# Patient Record
Sex: Male | Born: 1965
Health system: Southern US, Community
[De-identification: ages and names within clinical notes are randomized; demographics above are authoritative.]

## PROBLEM LIST (undated history)

## (undated) DIAGNOSIS — Z8249 Family history of ischemic heart disease and other diseases of the circulatory system: Secondary | ICD-10-CM

## (undated) DIAGNOSIS — K219 Gastro-esophageal reflux disease without esophagitis: Secondary | ICD-10-CM

## (undated) DIAGNOSIS — I8390 Asymptomatic varicose veins of unspecified lower extremity: Secondary | ICD-10-CM

## (undated) DIAGNOSIS — N529 Male erectile dysfunction, unspecified: Secondary | ICD-10-CM

## (undated) DIAGNOSIS — I10 Essential (primary) hypertension: Secondary | ICD-10-CM

## (undated) DIAGNOSIS — R931 Abnormal findings on diagnostic imaging of heart and coronary circulation: Secondary | ICD-10-CM

## (undated) DIAGNOSIS — R600 Localized edema: Secondary | ICD-10-CM

## (undated) DIAGNOSIS — E785 Hyperlipidemia, unspecified: Secondary | ICD-10-CM

## (undated) HISTORY — DX: Family history of ischemic heart disease and other diseases of the circulatory system: Z82.49

## (undated) HISTORY — PX: ENDOVENOUS ABLATION SAPHENOUS VEIN W/ LASER: SUR449

## (undated) HISTORY — DX: Essential (primary) hypertension: I10

## (undated) HISTORY — DX: Gastro-esophageal reflux disease without esophagitis: K21.9

## (undated) HISTORY — DX: Localized edema: R60.0

## (undated) HISTORY — DX: Abnormal findings on diagnostic imaging of heart and coronary circulation: R93.1

## (undated) HISTORY — PX: TONSILLECTOMY: SUR1361

## (undated) HISTORY — DX: Hyperlipidemia, unspecified: E78.5

## (undated) HISTORY — DX: Male erectile dysfunction, unspecified: N52.9

## (undated) HISTORY — DX: Asymptomatic varicose veins of unspecified lower extremity: I83.90

---

## 2008-11-12 ENCOUNTER — Ambulatory Visit: Payer: Self-pay | Admitting: Family Medicine

## 2008-11-12 DIAGNOSIS — I10 Essential (primary) hypertension: Secondary | ICD-10-CM | POA: Insufficient documentation

## 2008-11-12 DIAGNOSIS — R609 Edema, unspecified: Secondary | ICD-10-CM | POA: Insufficient documentation

## 2008-11-16 LAB — CONVERTED CEMR LAB
Alkaline Phosphatase: 121 units/L — ABNORMAL HIGH (ref 39–117)
Bilirubin, Direct: 0.1 mg/dL (ref 0.0–0.3)
Chloride: 104 meq/L (ref 96–112)
Eosinophils Absolute: 0.1 10*3/uL (ref 0.0–0.7)
GFR calc Af Amer: 94 mL/min
GFR calc non Af Amer: 77 mL/min
Glucose, Bld: 95 mg/dL (ref 70–99)
HCT: 48.1 % (ref 39.0–52.0)
Monocytes Absolute: 0.8 10*3/uL (ref 0.1–1.0)
Monocytes Relative: 6.6 % (ref 3.0–12.0)
Neutrophils Relative %: 73.5 % (ref 43.0–77.0)
Platelets: 326 10*3/uL (ref 150–400)
Potassium: 4.6 meq/L (ref 3.5–5.1)
RDW: 13.3 % (ref 11.5–14.6)
Sodium: 138 meq/L (ref 135–145)

## 2008-11-30 ENCOUNTER — Encounter: Payer: Self-pay | Admitting: Family Medicine

## 2008-11-30 ENCOUNTER — Ambulatory Visit: Payer: Self-pay

## 2009-04-21 ENCOUNTER — Ambulatory Visit: Payer: Self-pay | Admitting: Family Medicine

## 2009-04-27 LAB — CONVERTED CEMR LAB
Calcium: 9.2 mg/dL (ref 8.4–10.5)
Direct LDL: 127.6 mg/dL
GFR calc non Af Amer: 85.93 mL/min (ref >60)
HDL: 25.4 mg/dL — ABNORMAL LOW (ref >39.00)
Sodium: 141 meq/L (ref 135–145)
Total CHOL/HDL Ratio: 8
Triglycerides: 296 mg/dL — ABNORMAL HIGH (ref 0.0–149.0)

## 2009-10-08 ENCOUNTER — Ambulatory Visit: Payer: Self-pay | Admitting: Family Medicine

## 2009-10-08 DIAGNOSIS — E785 Hyperlipidemia, unspecified: Secondary | ICD-10-CM | POA: Insufficient documentation

## 2009-10-08 DIAGNOSIS — I8390 Asymptomatic varicose veins of unspecified lower extremity: Secondary | ICD-10-CM | POA: Insufficient documentation

## 2009-11-03 ENCOUNTER — Encounter: Payer: Self-pay | Admitting: Family Medicine

## 2009-11-03 ENCOUNTER — Ambulatory Visit: Payer: Self-pay | Admitting: Vascular Surgery

## 2010-02-04 ENCOUNTER — Ambulatory Visit: Payer: Self-pay | Admitting: Vascular Surgery

## 2010-02-04 ENCOUNTER — Encounter: Payer: Self-pay | Admitting: Family Medicine

## 2010-03-02 ENCOUNTER — Ambulatory Visit: Payer: Self-pay | Admitting: Vascular Surgery

## 2010-03-02 ENCOUNTER — Encounter: Payer: Self-pay | Admitting: Family Medicine

## 2010-03-11 ENCOUNTER — Ambulatory Visit: Payer: Self-pay | Admitting: Vascular Surgery

## 2010-08-01 ENCOUNTER — Telehealth: Payer: Self-pay | Admitting: Family Medicine

## 2010-08-15 ENCOUNTER — Encounter: Payer: Self-pay | Admitting: Family Medicine

## 2010-08-15 ENCOUNTER — Ambulatory Visit: Payer: Self-pay | Admitting: Family Medicine

## 2010-08-17 ENCOUNTER — Telehealth: Payer: Self-pay | Admitting: Family Medicine

## 2010-08-17 LAB — CONVERTED CEMR LAB
Albumin: 4.2 g/dL (ref 3.5–5.2)
BUN: 15 mg/dL (ref 6–23)
Basophils Relative: 0.2 % (ref 0.0–3.0)
Bilirubin, Direct: 0.1 mg/dL (ref 0.0–0.3)
CO2: 25 meq/L (ref 19–32)
Chlamydia, Swab/Urine, PCR: NEGATIVE
Chloride: 105 meq/L (ref 96–112)
Cholesterol: 141 mg/dL (ref 0–200)
Creatinine, Ser: 1 mg/dL (ref 0.4–1.5)
Eosinophils Absolute: 0.1 10*3/uL (ref 0.0–0.7)
GC Probe Amp, Urine: NEGATIVE
MCHC: 34.7 g/dL (ref 30.0–36.0)
MCV: 90.2 fL (ref 78.0–100.0)
Monocytes Absolute: 0.8 10*3/uL (ref 0.1–1.0)
Neutrophils Relative %: 63.1 % (ref 43.0–77.0)
Platelets: 304 10*3/uL (ref 150.0–400.0)
RBC: 4.92 M/uL (ref 4.22–5.81)
TSH: 2.04 microintl units/mL (ref 0.35–5.50)
Total Protein: 7 g/dL (ref 6.0–8.3)
Triglycerides: 161 mg/dL — ABNORMAL HIGH (ref 0.0–149.0)

## 2010-08-24 ENCOUNTER — Telehealth: Payer: Self-pay | Admitting: Family Medicine

## 2010-09-02 ENCOUNTER — Telehealth: Payer: Self-pay | Admitting: Family Medicine

## 2010-10-04 NOTE — Assessment & Plan Note (Signed)
Summary: MED CHECK/BLOOD PRESSURE CHECK/CJR   Vital Signs:  Patient profile:   45 year old male Weight:      247 pounds BMI:     33.62 Temp:     98.8 degrees F oral Pulse rate:   81 / minute BP sitting:   144 / 92  (left arm) Cuff size:   large  Vitals Entered By: Alfred Levins, CMA (October 08, 2009 10:19 AM) CC: renew meds, referral for rt leg pain   History of Present Illness: Here to follow up on lipids and BP, and he would like a referral to see someone about his varicose veins in the legs. He asks to see Dr. Arbie Cookey about this. He took 3 months of Simvastatin last fall and tolerated it well, but unfortunately he never came back to check his lab work. he has been off it now for several months. He says he has changed his diet and eliminated fried foods. He feels fine in general. His BP at home is quite steady in the 120s / 70s.   Current Medications (verified): 1)  Lisinopril 10 Mg Tabs (Lisinopril) .... Once Daily 2)  Levitra 20 Mg Tabs (Vardenafil Hcl) .... As Needed 3)  Furosemide 40 Mg Tabs (Furosemide) .... Once Daily 4)  Simvastatin 40 Mg Tabs (Simvastatin) .... Take One Tablet At Bedtime  Allergies (verified): No Known Drug Allergies  Past History:  Past Medical History: Hypertension ED leg edema venous doppler of right leg on 11-30-08 was negative for DVT Hyperlipidemia  Review of Systems  The patient denies anorexia, fever, weight loss, weight gain, vision loss, decreased hearing, hoarseness, chest pain, syncope, dyspnea on exertion, peripheral edema, prolonged cough, headaches, hemoptysis, abdominal pain, melena, hematochezia, severe indigestion/heartburn, hematuria, incontinence, genital sores, muscle weakness, suspicious skin lesions, transient blindness, difficulty walking, depression, unusual weight change, abnormal bleeding, enlarged lymph nodes, angioedema, breast masses, and testicular masses.    Physical Exam  General:  Well-developed,well-nourished,in  no acute distress; alert,appropriate and cooperative throughout examination Lungs:  Normal respiratory effort, chest expands symmetrically. Lungs are clear to auscultation, no crackles or wheezes. Heart:  Normal rate and regular rhythm. S1 and S2 normal without gallop, murmur, click, rub or other extra sounds.   Impression & Recommendations:  Problem # 1:  HYPERLIPIDEMIA (ICD-272.4)  His updated medication list for this problem includes:    Simvastatin 40 Mg Tabs (Simvastatin) .Marland Kitchen... Take one tablet at bedtime  Problem # 2:  VARICOSE VEINS, LOWER EXTREMITIES (ICD-454.9)  Orders: [facility] (Vascular)  Problem # 3:  HYPERTENSION (ICD-401.9)  His updated medication list for this problem includes:    Lisinopril 10 Mg Tabs (Lisinopril) ..... Once daily    Furosemide 40 Mg Tabs (Furosemide) ..... Once daily  Complete Medication List: 1)  Lisinopril 10 Mg Tabs (Lisinopril) .... Once daily 2)  Levitra 20 Mg Tabs (Vardenafil hcl) .... As needed 3)  Furosemide 40 Mg Tabs (Furosemide) .... Once daily 4)  Simvastatin 40 Mg Tabs (Simvastatin) .... Take one tablet at bedtime  Patient Instructions: 1)  It is important that you exercise reguarly at least 20 minutes 5 times a week. If you develop chest pain, have severe difficulty breathing, or feel very tired, stop exercising immediately and seek medical attention.  2)  You need to lose weight. Consider a lower calorie diet and regular exercise.  3)  Restart on Simvastatin, and do follow up labs in 90 days.  4)  Refer to Dr. Arbie [facility]key [facility]  Prescriptions: SIMVASTATIN 40 MG TABS (SIMVASTATIN) Take one tablet at bedtime  #90 x 3   Entered and Authorized by:   Nelwyn Salisbury MD   Signed by:   Nelwyn Salisbury MD on 10/08/2009   Method used:   Print then Give to Patient   RxID:   [id number] FUROSEMIDE 40 MG TABS (FUROSEMIDE) once daily  #90 x 3   Entered and Authorized by:   Nelwyn Salisbury MD   Signed by:   Nelwyn Salisbury MD on 10/08/2009   Method used:   Print then Give to Patient   RxID:   [id number] LEVITRA 20 MG TABS (VARDENAFIL HCL) as needed  #10 x 11   Entered and Authorized by:   Nelwyn Salisbury MD   Signed by:   Nelwyn Salisbury MD on 10/08/2009   Method used:   Print then Give to Patient   RxID:   [id number] LISINOPRIL 10 MG TABS (LISINOPRIL) once daily  #90 x 3   Entered and Authorized by:   Nelwyn Salisbury MD   Signed by:   Nelwyn Salisbury MD on 10/08/2009   Method used:   Print then Give to Patient   RxID:   [id number]

## 2010-10-04 NOTE — Letter (Signed)
Summary: [facility]  [facility] [place]   Imported By: Maryln Gottron 03/18/2010 14:54:17  _____________________________________________________________________  External Attachment:    Type:   Image     Comment:   External Document

## 2010-10-04 NOTE — Letter (Signed)
Summary: [facility]  [facility] [place]   Imported By: Maryln Gottron 11/25/2009 13:11:10  _____________________________________________________________________  External Attachment:    Type:   Image     Comment:   External Document

## 2010-10-04 NOTE — Progress Notes (Signed)
Summary: wants labs ordered  Phone Note Call from Patient Call back at [phone]   Caller: Patient---live call Summary of Call: wants order to check cholesterol. wants lab to check for std and hiv, because he has a new girlfriend. Initial call taken by: Warnell Forester,  August 01, 2010 10:31 AM  Follow-up for Phone Call        needs an OV for this. Come in fasting  Follow-up by: Nelwyn Salisbury MD,  August 01, 2010 1:10 PM  Additional Follow-up for Phone Call Additional follow up Details #1::        pt will see dr fry next week. Additional Follow-up by: Warnell Forester,  August 01, 2010 1:54 PM

## 2010-10-04 NOTE — Letter (Signed)
Summary: [facility]  [facility] [place]   Imported By: Maryln Gottron 02/21/2010 11:06:04  _____________________________________________________________________  External Attachment:    Type:   Image     Comment:   External Document

## 2010-10-06 NOTE — Assessment & Plan Note (Signed)
Summary: fup---will fast --ok per dr fry//ccm/pt rsc/cjr   Vital Signs:  Patient profile:   45 year old male Weight:      260 pounds BMI:     35.39 O2 Sat:      91 % Temp:     98.2 degrees F oral Pulse rate:   71 / minute Pulse rhythm:   regular Resp:     12 per minute BP sitting:   152 / 96  Vitals Entered By: Lynann Beaver CMA AAMA (August 15, 2010 8:51 AM) CC: rov and labs  Pain Assessment Patient in pain? no        History of Present Illness: Here for follow up. He feels fine but has gained some weight. He does not exercise. His BP at home is steady in the 130s over 80s.   Current Medications (verified): 1)  Lisinopril 10 Mg Tabs (Lisinopril) .... Once Daily 2)  Levitra 20 Mg Tabs (Vardenafil Hcl) .... As Needed 3)  Simvastatin 40 Mg Tabs (Simvastatin) .... Take One Tablet At Bedtime 4)  Ranitidine Hcl 150 Mg Tabs (Ranitidine Hcl) .Marland Kitchen.. 1-2 Daily  Allergies (verified): No Known Drug Allergies  Past History:  Past Medical History: Reviewed history from 10/08/2009 and no changes required. Hypertension ED leg edema venous doppler of right leg on 11-30-08 was negative for DVT Hyperlipidemia  Past Surgical History: Tonsillectomy ablation of right lower leg veins June 2011 per Dr. Tawanna Cooler Early  Review of Systems  The patient denies anorexia, fever, weight loss, vision loss, decreased hearing, hoarseness, chest pain, syncope, dyspnea on exertion, peripheral edema, prolonged cough, headaches, hemoptysis, abdominal pain, melena, hematochezia, severe indigestion/heartburn, hematuria, incontinence, genital sores, muscle weakness, suspicious skin lesions, transient blindness, difficulty walking, depression, unusual weight change, abnormal bleeding, enlarged lymph nodes, angioedema, breast masses, and testicular masses.    Physical Exam  General:  overweight-appearing.   Neck:  No deformities, masses, or tenderness noted. Lungs:  Normal respiratory effort, chest expands  symmetrically. Lungs are clear to auscultation, no crackles or wheezes. Heart:  Normal rate and regular rhythm. S1 and S2 normal without gallop, murmur, click, rub or other extra sounds. Extremities:  no edema    Impression & Recommendations:  Problem # 1:  HYPERLIPIDEMIA (ICD-272.4)  His updated medication list for this problem includes:    Simvastatin 40 Mg Tabs (Simvastatin) .Marland Kitchen... Take one tablet at bedtime  Problem # 2:  HYPERTENSION (ICD-401.9)  The following medications were removed from the medication list:    Furosemide 40 Mg Tabs (Furosemide) ..... Once daily His updated medication list for this problem includes:    Lisinopril 10 Mg Tabs (Lisinopril) ..... Once daily  Orders: UA Dipstick w/o Micro (automated)  (81003) Venipuncture (16109) TLB-Lipid Panel (80061-LIPID) TLB-BMP (Basic Metabolic Panel-BMET) (80048-METABOL) TLB-CBC Platelet - w/Differential (85025-CBCD) TLB-Hepatic/Liver Function Pnl (80076-HEPATIC) TLB-TSH (Thyroid Stimulating Hormone) (84443-TSH)  Complete Medication List: 1)  Lisinopril 10 Mg Tabs (Lisinopril) .... Once daily 2)  Levitra 20 Mg Tabs (Vardenafil hcl) .... As needed 3)  Simvastatin 40 Mg Tabs (Simvastatin) .... Take one tablet at bedtime 4)  Ranitidine Hcl 150 Mg Tabs (Ranitidine hcl) .Marland Kitchen.. 1-2 daily  Other Orders: T-Chlamydia  Probe, urine [id number]) T-GC Probe, urine [phone]) T-HIV Antibody  (Reflex) [phone]) T-RPR (Syphilis) (96295-28413) Prescriptions: SIMVASTATIN 40 MG TABS (SIMVASTATIN) Take one tablet at bedtime  #90 x 3   Entered and Authorized by:   Nelwyn Salisbury MD   Signed by:   Nelwyn Salisbury MD on 08/15/2010   Method  used:   Print then Give to Patient   RxID:   [id number] LEVITRA 20 MG TABS (VARDENAFIL HCL) as needed  #30 x 3   Entered and Authorized by:   Nelwyn Salisbury MD   Signed by:   Nelwyn Salisbury MD on 08/15/2010   Method used:   Print then Give to Patient   RxID:   [id number] LISINOPRIL  10 MG TABS (LISINOPRIL) once daily  #90 x 3   Entered and Authorized by:   Nelwyn Salisbury MD   Signed by:   Nelwyn Salisbury MD on 08/15/2010   Method used:   Print then Give to Patient   RxID:   [id number]    Orders Added: 1)  Est. Patient Level IV [01027] 2)  UA Dipstick w/o Micro (automated)  [81003] 3)  Venipuncture [25366] 4)  TLB-Lipid Panel [80061-LIPID] 5)  TLB-BMP (Basic Metabolic Panel-BMET) [80048-METABOL] 6)  TLB-CBC Platelet - w/Differential [85025-CBCD] 7)  TLB-Hepatic/Liver Function Pnl [80076-HEPATIC] 8)  TLB-TSH (Thyroid Stimulating Hormone) [84443-TSH] 9)  T-Chlamydia  Probe, urine [[id number]] 10)  T-GC Probe, urine [phone] 11)  T-HIV Antibody  (Reflex) [[id number]] 12)  T-RPR (Syphilis) [06301-60109]  Appended Document: Orders Update    Clinical Lists Changes  Orders: Added new Service order of Specimen Handling (32355) - Signed      Appended Document: fup---will fast --ok per dr fry//ccm/pt rsc/cjr  Laboratory Results   Urine Tests    Routine Urinalysis   Color: yellow Appearance: Clear Glucose: negative   (Normal Range: Negative) Bilirubin: negative   (Normal Range: Negative) Ketone: negative   (Normal Range: Negative) Spec. Gravity: 1.025   (Normal Range: 1.003-1.035) Blood: negative   (Normal Range: Negative) pH: 6.0   (Normal Range: 5.0-8.0) Protein: negative   (Normal Range: Negative) Urobilinogen: 0.2   (Normal Range: 0-1) Nitrite: negative   (Normal Range: Negative) Leukocyte Esterace: negative   (Normal Range: Negative)    Comments: Rita Ohara  August 15, 2010 11:52 AM

## 2010-10-06 NOTE — Progress Notes (Signed)
Summary: lab results  Phone Note Call from Patient Call back at [phone]   Caller: Patient Call For: Nelwyn Salisbury MD Summary of Call: Pt would like lab results Initial call taken by: Heron Sabins,  August 17, 2010 8:45 AM  Follow-up for Phone Call        left message for pt to callback office Follow-up by: Brenton Grills CMA Duncan Dull),  August 17, 2010 4:34 PM  Additional Follow-up for Phone Call Additional follow up Details #1::        see the reports Additional Follow-up by: Nelwyn Salisbury MD,  August 17, 2010 4:46 PM    Additional Follow-up for Phone Call Additional follow up Details #2::    pt informed. copy of labs and report left up front in cabinet for pick up per pt's request Follow-up by: Brenton Grills CMA Duncan Dull),  August 17, 2010 4:49 PM

## 2010-10-06 NOTE — Progress Notes (Signed)
Summary: needs CMN for levitra  Phone Note Call from Patient Call back at Home Phone [phone]   Caller: Spouse----live call Summary of Call: wife picked up rxs today and she mentioned that there was supposed to have been a CMN filed out regarding his Levitra rx. please advise and call pt when done. Initial call taken by: Warnell Forester,  August 24, 2010 4:32 PM  Follow-up for Phone Call        I have not received such a fax yet. Will do this when it comes  Follow-up by: Nelwyn Salisbury MD,  August 24, 2010 5:27 PM  Additional Follow-up for Phone Call Additional follow up Details #1::        wife stated that the patient discussed this with dr fry during his visit. Additional Follow-up by: Warnell Forester,  August 25, 2010 8:02 AM    Additional Follow-up for Phone Call Additional follow up Details #2::    we still have not received anything about this. As I told him at the visit, we cannot generate a prior authorization. This needs to come from his pharmacist. he should ask the pharmacist to start the process, and I will fill out the form when it comes  Follow-up by: Nelwyn Salisbury MD,  August 30, 2010 1:16 PM  Additional Follow-up for Phone Call Additional follow up Details #3:: Details for Additional Follow-up Action Taken: left mess with pt to return call  left mess to return call --wed jan 4 at 2pm Additional Follow-up by: Pura Spice, RN,  September 06, 2010 5:16 PM

## 2010-10-06 NOTE — Progress Notes (Signed)
Summary: refill simvastatin   Phone Note Call from Patient   Caller: Spouse Call For: Nelwyn Salisbury MD Summary of Call: Wants to know if he can decrease his simvastatin since his chol is so low. [phone] Initial call taken by: Lynann Beaver CMA AAMA,  September 02, 2010 3:47 PM  Follow-up for Phone Call        No , I would rather he stay on the current dose  Follow-up by: Nelwyn Salisbury MD,  September 07, 2010 1:31 PM  Additional Follow-up for Phone Call Additional follow up Details #1::        left mess to return call  Additional Follow-up by: Pura Spice, RN,  September 07, 2010 1:58 PM     Appended Document: refill simvastatin  pt awaare

## 2010-11-11 ENCOUNTER — Other Ambulatory Visit: Payer: Self-pay | Admitting: *Deleted

## 2010-11-11 MED ORDER — LISINOPRIL 10 MG PO TABS: 10.0000 mg | ORAL_TABLET | Freq: Every day | ORAL | Status: DC

## 2011-01-17 NOTE — Assessment & Plan Note (Signed)
OFFICE VISIT   CAZ, WEAVER  DOB:  1965-11-02                                       02/04/2010  XBJYN#:[id number]   This patient presents today for continued discussion regarding his  severe venous hypertension and venous ulcer disease.  I had seen him  approximately 3-4 months ago.  He has extensive changes of venous  hypertension with hemosiderin deposit and erythema on his right medial  ankle.  He attempted to wear graduated compression stockings but these  made his discomfort much worse and is unable to tolerate them.  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  He also reports yard work and work around the  home is also difficult with any prolonged standing.  He does wear an  anklet wrap and this is not improving his venous hypertension.   I have again discussed options with the patient with his wife present.  I explained that he does have superficial and deep venous insufficiency.  I explained that the deep venous insufficiency would not be able to be  treated surgically.  I did explain that we would be able to reduce his  level of venous hypertension with ablation of his right great and small  saphenous vein since he has reflux in both of these.  This should make a  significant impact on his venous hypertension.  He understands and  wishes to proceed with surgery and we will schedule this at his earliest  convenience.     Larina Earthly, M.D.  Electronically Signed   TFE/MEDQ  D:  02/04/2010  T:  02/07/2010  Job:  [id number]   cc:   Jeannett Senior A. Clent Ridges, MD

## 2011-01-17 NOTE — Consult Note (Signed)
NEW PATIENT CONSULTATION   TARRANCE, JANUSZEWSKI  DOB:  11-18-1965                                       11/03/2009  SAYTK#:[id number]   The patient presents today for evaluation of bilateral venous  hypertension, right greater than left.  He is a very pleasant, active 45-  year-old gentleman with progressive changes of venous hypertension in  both legs, much more so on the right than on the left.  He does not have  any history of DVT, superficial thrombophlebitis or bleeding.  He does  have chronic swelling in his right leg more so than the left.  He did  have an old injury on the medial aspect of his right ankle with a  football injury with an open fracture in high school.  He does have  above this area a large area of hemosiderin deposit and changes of  chronic venous hypertension.  He is here today for further discussion of  this.  He does have a long history of varicose veins in both lower  extremities, more so on the right.  He does report discomfort with  prolonged standing.  His past history is significant only for  hypertension for which he is treated.  No other major medical  difficulties.   FAMILY HISTORY:  Significant for premature atherosclerotic disease with  a heart attack in his father at age 67.  He also has severe venous  varicose veins in a maternal grandfather.   SOCIAL HISTORY:  He is single with four children.  He is an Corporate investment banker.  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  He does not smoke or drink alcohol.   REVIEW OF SYSTEMS:  Otherwise unremarkable and is as documented in his  office chart.   PHYSICAL EXAM:  General:  A well-developed, well-nourished white male  appearing stated age of 79.  Vital signs:  Blood pressure is 142/95,  pulse 71, respirations 16.  In general he well-nourished and in no acute  distress.  HEENT:  Normal.  Musculoskeletal:  System shows no major  deformities or cyanosis.  Neurologic:  Without focal weakness,  paresthesias.   Skin:  He does have no open ulcer but hemosiderin  deposits and inflammation of the medial aspect of his right ankle.  He  has no similar changes on the left side.   He underwent noninvasive vascular laboratory studies in our office and I  reviewed this with the patient.  This does show gross reflux in his  great and small saphenous veins bilaterally.  I imaged these myself and  he does have an extremely dilated right small saphenous vein.   I discussed options with the patient.  He has not worn compression  garments and I have instructed him on the importance of their use,  elevation and also Advil 600 mg t.i.d. for discomfort.  I did feel that  he would be an excellent candidate for saphenous vein ablation and  discussed this option with him as well.  We have fitted him with thigh  high graduated compression garments today and plan to see him again in 3  months for continued discussion.     Larina Earthly, M.D.  Electronically Signed   TFE/MEDQ  D:  11/03/2009  T:  11/04/2009  Job:  [id number]   cc:   Jeannett Senior A. Clent Ridges, MD

## 2011-01-17 NOTE — Procedures (Signed)
LOWER EXTREMITY VENOUS REFLUX EXAM   INDICATION:  Right lower extremity intermittent swelling with varicose  veins.   EXAM:  Using color-flow imaging and pulse Doppler spectral analysis, the  right and left common femoral, superficial femoral, popliteal, posterior  tibial, greater and lesser saphenous veins are evaluated.  There is  evidence suggesting deep venous insufficiency in the right and left  lower extremities.   The right and left saphenofemoral junctions are not competent with  Reflux of >553milliseconds. The right and left GSV's are not competent  with Reflux of >516milliseconds with the caliber as described below.)   The right and left proximal short saphenous veins demonstrate  incompetency, the right measuring 0.41 cm to 1.61 cm, the left from 0.34  cm to 0.74 cm.   GSV Diameter (used if found to be incompetent only)                                            Right    Left  Proximal Greater Saphenous Vein           0.38 cm  0.45 cm  Proximal-to-mid-thigh                     cm       cm  Mid thigh                                 0.56 cm  cm  Mid-distal thigh                          cm       cm  Distal thigh                              0.62 cm  0.88 cm  Knee                                      0.52 cm  0.73 cm   IMPRESSION:  1. Right greater saphenous vein Reflux with >591milliseconds is      identified with the caliber ranging from 0.38 cm to 0.62 cm knee to      groin.  The left from 0.45 cm to 0.88 cm.  2. The right and left greater saphenous veins are not aneurysmal.  3. The right and left greater saphenous veins are not tortuous.  4. The deep venous system is not competent with Reflux of      >526milliseconds.)  5. The right and left lesser saphenous veins are not competent with      Reflux of >51milliseconds.)         ___________________________________________  Larina Earthly, M.D.   AS/MEDQ  D:  11/03/2009  T:  11/03/2009  Job:   [id number]

## 2011-01-17 NOTE — Assessment & Plan Note (Signed)
OFFICE VISIT   LAZARUS, SUDBURY  DOB:  [birth year]                                       03/02/2010  ZOXWR#:[id number]   The patient presents today for treatment of his right leg venous  hypertension.  He underwent laser ablation of his right great saphenous  vein from his mid calf to the saphenofemoral junction and a small  saphenous vein from the distal calf to the saphenopopliteal junction.  He had no immediate complications and was discharged to home.  We will  see him in 1 week with repeat duplex.     Larina Earthly, M.D.  Electronically Signed   TFE/MEDQ  D:  03/02/2010  T:  03/03/2010  Job:  [id number]   cc:   Jeannett Senior A. Clent Ridges, MD

## 2011-01-17 NOTE — Assessment & Plan Note (Signed)
OFFICE VISIT   Lee Ballard, Lee Ballard  DOB:  08/09/1966                                       03/11/2010  ZOXWR#:[id number]   Patient presents today for 1-week followup of his laser ablation of his  right great and small saphenous vein for severe venous hypertension.  He  has the usual amount of soreness over the ablation sites themselves and  had very mild bruising.  He has been compliant with his graduated  compression garments.   Physical exam is unchanged.  Blood pressure 121/81, pulse 82,  respirations 18.  His dorsalis pedis pulses are 2+ bilaterally.  He does  have some thickening over the ablation site as well.   He underwent repeat venous duplex today which I have ordered and  independently reviewed and discussed with patient.  This shows ablation  of his great and small saphenous vein on the right with no evidence of  popliteal or femoral DVT.   I am pleased with the early result as is patient.  He will continue his  thigh-high compression for an additional week and then will drop back to  the knee-high compression due to his deep venous incompetence as well.  He does have saphenous vein incompetence and some early varicosity his  left leg.  We discussed this as well.  I explained that we are  conservative with recommendations regarding this and would recommend  treatment only should he develop progressive symptoms.     Larina Earthly, M.D.  Electronically Signed   TFE/MEDQ  D:  03/11/2010  T:  03/14/2010  Job:  [id number]

## 2011-01-20 NOTE — Procedures (Signed)
DUPLEX DEEP VENOUS EXAM - LOWER EXTREMITY   INDICATION:  Followup right greater saphenous vein ablation.   HISTORY:  Edema:  No  Trauma/Surgery:  Right greater saphenous vein ablation  Pain:  Yes  PE:  No  Previous DVT:  No  Anticoagulants:  No  Other:  No   DUPLEX EXAM:                CFV   SFV   PopV  PTV    GSV                R  L  R  L  R  L  R   L  R  L  Thrombosis    0  0  0     0     0      +  Spontaneous   +  +  +     +     +      0  Phasic        +  +  +     +     +      0  Augmentation  +  +  +     +     +      0  Compressible  +  +  +     +     +      0  Competent     +  0  0     0     +      0   Legend:  + - yes  o - no  p - partial  D - decreased   IMPRESSION:  There does not appear to be any evidence of deep vein  thrombus in the right leg.  The right greater saphenous vein appears  ablated proximal to the level of the knee.  The lesser saphenous vein  appears ablated at the junction to mid-calf.  Reflux noted in the right  femoral vein and popliteal vein.    _____________________________  Larina Earthly, M.D.   CB/MEDQ  D:  03/14/2010  T:  03/14/2010  Job:  [id number]

## 2011-01-24 ENCOUNTER — Other Ambulatory Visit: Payer: Self-pay | Admitting: *Deleted

## 2011-01-24 MED ORDER — VARDENAFIL HCL 20 MG PO TABS: 20.0000 mg | ORAL_TABLET | ORAL | Status: DC | PRN

## 2011-02-01 ENCOUNTER — Ambulatory Visit (INDEPENDENT_AMBULATORY_CARE_PROVIDER_SITE_OTHER): Payer: Managed Care, Other (non HMO) | Admitting: Family Medicine

## 2011-02-01 ENCOUNTER — Encounter: Payer: Self-pay | Admitting: Family Medicine

## 2011-02-01 VITALS — BP 132/88 | HR 84 | Temp 98.7°F | Resp 16 | Wt 273.0 lb

## 2011-02-01 DIAGNOSIS — J40 Bronchitis, not specified as acute or chronic: Secondary | ICD-10-CM

## 2011-02-01 MED ORDER — AZITHROMYCIN 250 MG PO TABS: ORAL_TABLET | ORAL | Status: AC

## 2011-02-01 NOTE — Progress Notes (Signed)
  Subjective:    Patient ID: Lee Ballard, male    DOB: June 13, 1966, 45 y.o.   MRN: [id number]  HPI Here for 3 days of PND, chest congestion, and coughing up green sputum. No fever. On Mucinex.   Review of Systems  Constitutional: Negative.   HENT: Positive for postnasal drip. Negative for congestion and sinus pressure.   Eyes: Negative.   Respiratory: Positive for cough.        Objective:   Physical Exam  Constitutional: He appears well-developed and well-nourished.  HENT:  Right Ear: External ear normal.  Left Ear: External ear normal.  Nose: Nose normal.  Mouth/Throat: Oropharynx is clear and moist. No oropharyngeal exudate.  Eyes: Conjunctivae are normal. Pupils are equal, round, and reactive to light.  Pulmonary/Chest: Effort normal and breath sounds normal. No respiratory distress. He has no wheezes. He has no rales. He exhibits no tenderness.  Lymphadenopathy:    He has no cervical adenopathy.          Assessment & Plan:  Rest, fluids

## 2011-06-07 ENCOUNTER — Telehealth: Payer: Self-pay | Admitting: Family Medicine

## 2011-06-07 MED ORDER — LISINOPRIL 10 MG PO TABS: 10.0000 mg | ORAL_TABLET | Freq: Every day | ORAL | Status: DC

## 2011-06-07 NOTE — Telephone Encounter (Signed)
Script sent e-scribe 

## 2011-10-10 ENCOUNTER — Other Ambulatory Visit: Payer: Self-pay | Admitting: Family Medicine

## 2011-10-10 NOTE — Telephone Encounter (Signed)
Pt would like new rx call into [facility] [address] simvastatin 40 mg #90

## 2011-10-12 MED ORDER — SIMVASTATIN 40 MG PO TABS: 40.0000 mg | ORAL_TABLET | Freq: Every day | ORAL | Status: DC

## 2011-10-12 NOTE — Telephone Encounter (Signed)
Script sent e-scribe 

## 2011-11-20 ENCOUNTER — Telehealth: Payer: Self-pay | Admitting: Family Medicine

## 2011-11-20 MED ORDER — SIMVASTATIN 40 MG PO TABS: 40.0000 mg | ORAL_TABLET | Freq: Every day | ORAL | Status: DC

## 2011-11-20 NOTE — Telephone Encounter (Signed)
Script was sent e-scribe to [facility].

## 2011-11-24 ENCOUNTER — Encounter: Payer: Self-pay | Admitting: Family Medicine

## 2011-11-24 ENCOUNTER — Ambulatory Visit (INDEPENDENT_AMBULATORY_CARE_PROVIDER_SITE_OTHER): Payer: Managed Care, Other (non HMO) | Admitting: Family Medicine

## 2011-11-24 VITALS — BP 148/86 | Temp 98.0°F | Wt 275.5 lb

## 2011-11-24 DIAGNOSIS — J40 Bronchitis, not specified as acute or chronic: Secondary | ICD-10-CM

## 2011-11-24 MED ORDER — AZITHROMYCIN 250 MG PO TABS: ORAL_TABLET | ORAL | Status: AC

## 2011-11-24 NOTE — Progress Notes (Signed)
  Subjective:    Patient ID: Lee Ballard, male    DOB: October 29, 1965, 46 y.o.   MRN: [id number]  HPI Here for 5 days of PND, chest congestion and coughing up green sputum. No fever. On Municex DM  Review of Systems  Constitutional: Negative.   HENT: Negative.   Eyes: Negative.   Respiratory: Positive for cough.        Objective:   Physical Exam  Constitutional: He appears well-developed and well-nourished.  HENT:  Right Ear: External ear normal.  Left Ear: External ear normal.  Nose: Nose normal.  Mouth/Throat: Oropharynx is clear and moist. No oropharyngeal exudate.  Eyes: Conjunctivae are normal.  Pulmonary/Chest: Effort normal and breath sounds normal.  Lymphadenopathy:    He has no cervical adenopathy.          Assessment & Plan:  Recheck prn

## 2012-03-14 ENCOUNTER — Other Ambulatory Visit: Payer: Self-pay | Admitting: Family Medicine

## 2012-03-29 ENCOUNTER — Telehealth: Payer: Self-pay | Admitting: Family Medicine

## 2012-03-29 MED ORDER — SIMVASTATIN 40 MG PO TABS: 40.0000 mg | ORAL_TABLET | Freq: Every day | ORAL | Status: DC

## 2012-03-29 NOTE — Telephone Encounter (Signed)
Refill request for Zocor and I did send e-scribe to [facility].

## 2012-09-08 ENCOUNTER — Other Ambulatory Visit: Payer: Self-pay | Admitting: Family Medicine

## 2012-09-26 ENCOUNTER — Ambulatory Visit (INDEPENDENT_AMBULATORY_CARE_PROVIDER_SITE_OTHER): Payer: Managed Care, Other (non HMO) | Admitting: Family

## 2012-09-26 ENCOUNTER — Encounter: Payer: Self-pay | Admitting: Family

## 2012-09-26 VITALS — BP 132/90 | HR 80 | Temp 98.4°F | Wt 265.0 lb

## 2012-09-26 DIAGNOSIS — J019 Acute sinusitis, unspecified: Secondary | ICD-10-CM

## 2012-09-26 DIAGNOSIS — R05 Cough: Secondary | ICD-10-CM

## 2012-09-26 DIAGNOSIS — R059 Cough, unspecified: Secondary | ICD-10-CM

## 2012-09-26 MED ORDER — AZITHROMYCIN 250 MG PO TABS: ORAL_TABLET | ORAL | Status: DC

## 2012-09-26 MED ORDER — FLUTICASONE PROPIONATE 50 MCG/ACT NA SUSP: 2.0000 | Freq: Every day | NASAL | Status: DC

## 2012-09-26 NOTE — Progress Notes (Signed)
  Subjective:    Patient ID: Lee Ballard, male    DOB: 1966-07-19, 47 y.o.   MRN: [id number]  HPI 47 year old WM, nonsmoker, patient of Dr. Clent Ridges is in today with c/o worsening cough, congestion, sinus pressure, x 1 week. He has been taking Mucinex OTC with no relief.    Review of Systems  Constitutional: Positive for fatigue.  HENT: Positive for congestion, sore throat, sneezing, postnasal drip and sinus pressure.   Respiratory: Positive for cough.   Cardiovascular: Negative.   Gastrointestinal: Negative.   Musculoskeletal: Negative.   Skin: Negative.   Hematological: Negative.   Psychiatric/Behavioral: Negative.    No past medical history on file.  History   Social History  . Marital Status: Divorced    Spouse Name: N/A    Number of Children: N/A  . Years of Education: N/A   Occupational History  . Not on file.   Social History Main Topics  . Smoking status: Never Smoker   . Smokeless tobacco: Not on file  . Alcohol Use: Not on file  . Drug Use: Not on file  . Sexually Active: Not on file   Other Topics Concern  . Not on file   Social History Narrative  . No narrative on file    No past surgical history on file.  No family history on file.  No Known Allergies  Current Outpatient Prescriptions on File Prior to Visit  Medication Sig Dispense Refill  . lisinopril (PRINIVIL,ZESTRIL) 10 MG tablet TAKE ONE TABLET BY MOUTH EVERY DAY  90 tablet  0  . Multiple Vitamins-Minerals (MULTIVITAMIN,TX-MINERALS) tablet Take 1 tablet by mouth daily.        Marland Kitchen omeprazole (PRILOSEC) 20 MG capsule Take 20 mg by mouth daily.        . simvastatin (ZOCOR) 40 MG tablet Take 1 tablet (40 mg total) by mouth at bedtime.  90 tablet  3  . fluticasone (FLONASE) 50 MCG/ACT nasal spray Place 2 sprays into the nose daily.  16 g  6  . vardenafil (LEVITRA) 20 MG tablet Take 1 tablet (20 mg total) by mouth as needed for erectile dysfunction.  10 tablet  1    BP 132/90  Pulse 80  Temp 98.4  F (36.9 C) (Oral)  Wt 265 lb (120.203 kg)  SpO2 98%chart    Objective:   Physical Exam  Constitutional: He is oriented to person, place, and time. He appears well-developed and well-nourished.  HENT:  Right Ear: External ear normal.  Left Ear: External ear normal.       Maxillary sinus tenderness to palpation.   Neck: Normal range of motion. Neck supple.  Cardiovascular: Normal rate, regular rhythm and normal heart sounds.   Pulmonary/Chest: Effort normal and breath sounds normal.  Neurological: He is alert and oriented to person, place, and time.  Skin: Skin is warm and dry.  Psychiatric: He has a normal mood and affect.          Assessment & Plan:  Assessment: Acute Sinusitis, Allergic Rhinitis  Plan: Flonase 2 sprays in each nostril once a day. zpak as directed. Call the office if symptoms worsen or persist. Recheck as scheduled and as needed sooner.

## 2012-09-26 NOTE — Patient Instructions (Signed)

## 2012-10-25 ENCOUNTER — Other Ambulatory Visit: Payer: Self-pay | Admitting: Family Medicine

## 2013-01-01 ENCOUNTER — Telehealth: Payer: Self-pay | Admitting: Family Medicine

## 2013-01-01 MED ORDER — LISINOPRIL 10 MG PO TABS: ORAL_TABLET | ORAL | Status: DC

## 2013-01-01 NOTE — Telephone Encounter (Signed)
Pt needs refill of lisinopril (PRINIVIL,ZESTRIL) 10 MG tablet. 90 day supply. Pharm/ [facility]/ [place] Pt has made CPE appt for 01/20/13.

## 2013-01-01 NOTE — Telephone Encounter (Signed)
rx sent in electronically 

## 2013-01-16 ENCOUNTER — Other Ambulatory Visit: Payer: Managed Care, Other (non HMO)

## 2013-01-20 ENCOUNTER — Encounter: Payer: Managed Care, Other (non HMO) | Admitting: Family Medicine

## 2013-01-22 ENCOUNTER — Ambulatory Visit (INDEPENDENT_AMBULATORY_CARE_PROVIDER_SITE_OTHER): Payer: Managed Care, Other (non HMO) | Admitting: Family Medicine

## 2013-01-22 ENCOUNTER — Encounter: Payer: Self-pay | Admitting: Family Medicine

## 2013-01-22 VITALS — BP 140/80 | HR 84 | Temp 99.2°F | Ht 71.5 in | Wt 261.0 lb

## 2013-01-22 DIAGNOSIS — Z0000 Encounter for general adult medical examination without abnormal findings: Secondary | ICD-10-CM

## 2013-01-22 DIAGNOSIS — Z8249 Family history of ischemic heart disease and other diseases of the circulatory system: Secondary | ICD-10-CM

## 2013-01-22 DIAGNOSIS — G4733 Obstructive sleep apnea (adult) (pediatric): Secondary | ICD-10-CM

## 2013-01-22 MED ORDER — LISINOPRIL 20 MG PO TABS: 20.0000 mg | ORAL_TABLET | Freq: Every day | ORAL | Status: DC

## 2013-01-22 MED ORDER — SIMVASTATIN 40 MG PO TABS: 40.0000 mg | ORAL_TABLET | Freq: Every day | ORAL | Status: DC

## 2013-01-22 MED ORDER — OMEPRAZOLE 40 MG PO CPDR: 40.0000 mg | DELAYED_RELEASE_CAPSULE | Freq: Every day | ORAL | Status: DC

## 2013-01-22 MED ORDER — VARDENAFIL HCL 20 MG PO TABS: 20.0000 mg | ORAL_TABLET | ORAL | Status: DC | PRN

## 2013-01-22 NOTE — Progress Notes (Signed)
  Subjective:    Patient ID: Lee Ballard, male    DOB: Jan 04, 1966, 47 y.o.   MRN: [id number]  HPI 47 yr old male for a cpx. His wife is with him and she has a lot of concerns. His father died of an MI at the age of 77, and she is worried about his cardiac risk. His BP has been running high for 2 months, often in the 140s over 90s at home. He denies any chest pain or SOB. He does not exercise. He admits to feeling very tired and sleepy most of the time even though he feels like he sleeps well. His wife says he does not snore but he stops breathing frequently every night. His GERD has not been as well controlled lately and he often takes an extra dose of Omeprazole.    Review of Systems  Constitutional: Positive for fatigue. Negative for fever, chills, diaphoresis, activity change, appetite change and unexpected weight change.  HENT: Negative.   Eyes: Negative.   Respiratory: Negative.   Cardiovascular: Negative.   Gastrointestinal: Negative.   Genitourinary: Negative.   Musculoskeletal: Negative.   Skin: Negative.   Neurological: Negative.   Psychiatric/Behavioral: Negative.        Objective:   Physical Exam  Constitutional: He is oriented to person, place, and time. No distress.  Morbidly obese   HENT:  Head: Normocephalic and atraumatic.  Right Ear: External ear normal.  Left Ear: External ear normal.  Nose: Nose normal.  Mouth/Throat: Oropharynx is clear and moist. No oropharyngeal exudate.  Eyes: Conjunctivae and EOM are normal. Pupils are equal, round, and reactive to light. Right eye exhibits no discharge. Left eye exhibits no discharge. No scleral icterus.  Neck: Neck supple. No JVD present. No tracheal deviation present. No thyromegaly present.  Cardiovascular: Normal rate, regular rhythm, normal heart sounds and intact distal pulses.  Exam reveals no gallop and no friction rub.   No murmur heard. EKG normal   Pulmonary/Chest: Effort normal and breath sounds normal. No  respiratory distress. He has no wheezes. He has no rales. He exhibits no tenderness.  Abdominal: Soft. Bowel sounds are normal. He exhibits no distension and no mass. There is no tenderness. There is no rebound and no guarding.  Genitourinary: Rectum normal, prostate normal and penis normal. Guaiac negative stool. No penile tenderness.  Musculoskeletal: Normal range of motion. He exhibits no tenderness.  2+ edema of both lower legs (he wears compression stockings)   Lymphadenopathy:    He has no cervical adenopathy.  Neurological: He is alert and oriented to person, place, and time. He has normal reflexes. No cranial nerve deficit. He exhibits normal muscle tone. Coordination normal.  Skin: Skin is warm and dry. No rash noted. He is not diaphoretic. No erythema. No pallor.  Psychiatric: He has a normal mood and affect. His behavior is normal. Judgment and thought content normal.          Assessment & Plan:  Well exam. He needs to lose weight and we discussed his need to exercise. He likely has obstructive sleep apnea so we will refer to have this worked up. Increase the Lisinopril to 20 mg a day for the BP. Increase Omemprazole to 40 mg a day for the GERD. Get fasting labs.

## 2013-01-23 ENCOUNTER — Other Ambulatory Visit (INDEPENDENT_AMBULATORY_CARE_PROVIDER_SITE_OTHER): Payer: Managed Care, Other (non HMO)

## 2013-01-23 DIAGNOSIS — Z0000 Encounter for general adult medical examination without abnormal findings: Secondary | ICD-10-CM

## 2013-01-23 LAB — HEPATIC FUNCTION PANEL
AST: 24 U/L (ref 0–37)
Total Bilirubin: 0.7 mg/dL (ref 0.3–1.2)

## 2013-01-23 LAB — CBC WITH DIFFERENTIAL/PLATELET
Basophils Absolute: 0 10*3/uL (ref 0.0–0.1)
HCT: 46.5 % (ref 39.0–52.0)
Lymphs Abs: 2.8 10*3/uL (ref 0.7–4.0)
Monocytes Absolute: 0.8 10*3/uL (ref 0.1–1.0)
Monocytes Relative: 8.4 % (ref 3.0–12.0)
Platelets: 323 10*3/uL (ref 150.0–400.0)
RDW: 14 % (ref 11.5–14.6)

## 2013-01-23 LAB — POCT URINALYSIS DIPSTICK
Bilirubin, UA: NEGATIVE
Glucose, UA: NEGATIVE
Leukocytes, UA: NEGATIVE
Nitrite, UA: NEGATIVE

## 2013-01-23 LAB — TSH: TSH: 2.39 u[IU]/mL (ref 0.35–5.50)

## 2013-01-23 LAB — BASIC METABOLIC PANEL
BUN: 16 mg/dL (ref 6–23)
Calcium: 9.5 mg/dL (ref 8.4–10.5)
GFR: 72.67 mL/min (ref >60.00)
Glucose, Bld: 95 mg/dL (ref 70–99)
Potassium: 4.1 mEq/L (ref 3.5–5.1)

## 2013-01-23 LAB — LIPID PANEL
Cholesterol: 157 mg/dL (ref 0–200)
Total CHOL/HDL Ratio: 6
Triglycerides: 253 mg/dL — ABNORMAL HIGH (ref 0.0–149.0)
VLDL: 50.6 mg/dL — ABNORMAL HIGH (ref 0.0–40.0)

## 2013-01-28 NOTE — Progress Notes (Signed)
Quick Note:  I left voice message with results. ______ 

## 2013-02-13 ENCOUNTER — Institutional Professional Consult (permissible substitution): Payer: Managed Care, Other (non HMO) | Admitting: Pulmonary Disease

## 2013-02-14 ENCOUNTER — Institutional Professional Consult (permissible substitution): Payer: Managed Care, Other (non HMO) | Admitting: Pulmonary Disease

## 2014-01-03 ENCOUNTER — Other Ambulatory Visit: Payer: Self-pay | Admitting: Family Medicine

## 2014-02-24 ENCOUNTER — Telehealth: Payer: Self-pay | Admitting: Family Medicine

## 2014-02-24 MED ORDER — OMEPRAZOLE 40 MG PO CPDR: 40.0000 mg | DELAYED_RELEASE_CAPSULE | Freq: Every day | ORAL | Status: DC

## 2014-02-24 NOTE — Telephone Encounter (Signed)
[place] [place] is requesting 90 day re-fill on omeprazole (PRILOSEC) 40 MG capsule

## 2014-02-24 NOTE — Telephone Encounter (Signed)
I sent script e-scribe. 

## 2014-03-10 ENCOUNTER — Telehealth: Payer: Self-pay | Admitting: Family Medicine

## 2014-03-10 ENCOUNTER — Other Ambulatory Visit (INDEPENDENT_AMBULATORY_CARE_PROVIDER_SITE_OTHER): Payer: Managed Care, Other (non HMO)

## 2014-03-10 DIAGNOSIS — Z0000 Encounter for general adult medical examination without abnormal findings: Secondary | ICD-10-CM

## 2014-03-10 LAB — CBC WITH DIFFERENTIAL/PLATELET
BASOS PCT: 0.4 % (ref 0.0–3.0)
Basophils Absolute: 0 10*3/uL (ref 0.0–0.1)
EOS PCT: 2.1 % (ref 0.0–5.0)
Eosinophils Absolute: 0.2 10*3/uL (ref 0.0–0.7)
HCT: 46.6 % (ref 39.0–52.0)
HEMOGLOBIN: 15.8 g/dL (ref 13.0–17.0)
LYMPHS PCT: 30.4 % (ref 12.0–46.0)
Lymphs Abs: 3 10*3/uL (ref 0.7–4.0)
MCHC: 34 g/dL (ref 30.0–36.0)
MCV: 90.2 fl (ref 78.0–100.0)
MONOS PCT: 7.6 % (ref 3.0–12.0)
Monocytes Absolute: 0.7 10*3/uL (ref 0.1–1.0)
NEUTROS ABS: 5.8 10*3/uL (ref 1.4–7.7)
Neutrophils Relative %: 59.5 % (ref 43.0–77.0)
Platelets: 328 10*3/uL (ref 150.0–400.0)
RBC: 5.16 Mil/uL (ref 4.22–5.81)
RDW: 14.3 % (ref 11.5–15.5)
WBC: 9.8 10*3/uL (ref 4.0–10.5)

## 2014-03-10 LAB — POCT URINALYSIS DIPSTICK
Bilirubin, UA: NEGATIVE
Blood, UA: NEGATIVE
Glucose, UA: NEGATIVE
KETONES UA: NEGATIVE
Leukocytes, UA: NEGATIVE
Nitrite, UA: NEGATIVE
PH UA: 7
PROTEIN UA: NEGATIVE
SPEC GRAV UA: 1.02
Urobilinogen, UA: 0.2

## 2014-03-10 LAB — BASIC METABOLIC PANEL
BUN: 18 mg/dL (ref 6–23)
CALCIUM: 9.7 mg/dL (ref 8.4–10.5)
CO2: 26 meq/L (ref 19–32)
Chloride: 103 mEq/L (ref 96–112)
Creatinine, Ser: 1 mg/dL (ref 0.4–1.5)
GFR: 87.15 mL/min (ref >60.00)
Glucose, Bld: 120 mg/dL — ABNORMAL HIGH (ref 70–99)
Potassium: 4.7 mEq/L (ref 3.5–5.1)
SODIUM: 137 meq/L (ref 135–145)

## 2014-03-10 LAB — LIPID PANEL
CHOLESTEROL: 163 mg/dL (ref 0–200)
HDL: 31.4 mg/dL — AB (ref >39.00)
LDL CALC: 73 mg/dL (ref 0–99)
NONHDL: 131.6
Total CHOL/HDL Ratio: 5
Triglycerides: 292 mg/dL — ABNORMAL HIGH (ref 0.0–149.0)
VLDL: 58.4 mg/dL — AB (ref 0.0–40.0)

## 2014-03-10 LAB — TSH: TSH: 2.3 u[IU]/mL (ref 0.35–4.50)

## 2014-03-10 LAB — HEPATIC FUNCTION PANEL
ALBUMIN: 4.1 g/dL (ref 3.5–5.2)
ALK PHOS: 109 U/L (ref 39–117)
ALT: 39 U/L (ref 0–53)
AST: 33 U/L (ref 0–37)
Bilirubin, Direct: 0.2 mg/dL (ref 0.0–0.3)
TOTAL PROTEIN: 6.9 g/dL (ref 6.0–8.3)
Total Bilirubin: 0.8 mg/dL (ref 0.2–1.2)

## 2014-03-10 LAB — PSA: PSA: 0.75 ng/mL (ref 0.10–4.00)

## 2014-03-10 MED ORDER — LISINOPRIL 20 MG PO TABS
ORAL_TABLET | ORAL | Status: DC
Start: 2014-03-10 — End: 2014-03-17

## 2014-03-10 NOTE — Telephone Encounter (Signed)
Pt req rx on lisinopril (PRINIVIL,ZESTRIL) 20 MG tablet  Pharmacy [facility]    Pt is scheduled for cpe next week

## 2014-03-10 NOTE — Telephone Encounter (Signed)
okx1 per Dr Sarajane Jews, rx sent in electronically

## 2014-03-12 ENCOUNTER — Encounter: Payer: Self-pay | Admitting: Family Medicine

## 2014-03-17 ENCOUNTER — Ambulatory Visit (INDEPENDENT_AMBULATORY_CARE_PROVIDER_SITE_OTHER): Payer: Managed Care, Other (non HMO) | Admitting: Family Medicine

## 2014-03-17 ENCOUNTER — Encounter: Payer: Self-pay | Admitting: Family Medicine

## 2014-03-17 VITALS — BP 140/84 | HR 73 | Temp 98.6°F | Ht 71.5 in | Wt 274.0 lb

## 2014-03-17 DIAGNOSIS — Z0000 Encounter for general adult medical examination without abnormal findings: Secondary | ICD-10-CM

## 2014-03-17 DIAGNOSIS — R739 Hyperglycemia, unspecified: Secondary | ICD-10-CM

## 2014-03-17 DIAGNOSIS — R7309 Other abnormal glucose: Secondary | ICD-10-CM

## 2014-03-17 MED ORDER — LISINOPRIL 20 MG PO TABS: ORAL_TABLET | ORAL | Status: DC

## 2014-03-17 MED ORDER — SIMVASTATIN 40 MG PO TABS: 40.0000 mg | ORAL_TABLET | Freq: Every day | ORAL | Status: DC

## 2014-03-17 MED ORDER — PANTOPRAZOLE SODIUM 40 MG PO TBEC: 40.0000 mg | DELAYED_RELEASE_TABLET | Freq: Every day | ORAL | Status: DC

## 2014-03-17 NOTE — Progress Notes (Signed)
Pre visit review using our clinic review tool, if applicable. No additional management support is needed unless otherwise documented below in the visit note. 

## 2014-03-17 NOTE — Progress Notes (Signed)
   Subjective:    Patient ID: Lee Ballard, male    DOB: [birth year], 48 y.o.   MRN: [id number]  HPI 48 yr old male for a cpx. He feels well although his Omeprazole does not last him all day. He takes this in the morning and he does well but he may have GERD at night depending on his diet.    Review of Systems  Constitutional: Negative.   HENT: Negative.   Eyes: Negative.   Respiratory: Negative.   Cardiovascular: Negative.   Gastrointestinal: Negative.   Genitourinary: Negative.   Musculoskeletal: Negative.   Skin: Negative.   Neurological: Negative.   Psychiatric/Behavioral: Negative.        Objective:   Physical Exam  Constitutional: He is oriented to person, place, and time. He appears well-developed and well-nourished. No distress.  HENT:  Head: Normocephalic and atraumatic.  Right Ear: External ear normal.  Left Ear: External ear normal.  Nose: Nose normal.  Mouth/Throat: Oropharynx is clear and moist. No oropharyngeal exudate.  Eyes: Conjunctivae and EOM are normal. Pupils are equal, round, and reactive to light. Right eye exhibits no discharge. Left eye exhibits no discharge. No scleral icterus.  Neck: Neck supple. No JVD present. No tracheal deviation present. No thyromegaly present.  Cardiovascular: Normal rate, regular rhythm, normal heart sounds and intact distal pulses.  Exam reveals no gallop and no friction rub.   No murmur heard. Pulmonary/Chest: Effort normal and breath sounds normal. No respiratory distress. He has no wheezes. He has no rales. He exhibits no tenderness.  Abdominal: Soft. Bowel sounds are normal. He exhibits no distension and no mass. There is no tenderness. There is no rebound and no guarding.  Genitourinary: Rectum normal, prostate normal and penis normal. Guaiac negative stool. No penile tenderness.  Musculoskeletal: Normal range of motion. He exhibits no edema and no tenderness.  Lymphadenopathy:    He has no cervical adenopathy.    Neurological: He is alert and oriented to person, place, and time. He has normal reflexes. No cranial nerve deficit. He exhibits normal muscle tone. Coordination normal.  Skin: Skin is warm and dry. No rash noted. He is not diaphoretic. No erythema. No pallor.  Psychiatric: He has a normal mood and affect. His behavior is normal. Judgment and thought content normal.          Assessment & Plan:  Well exam. We discussed his rising glucose and he needs to reduce his carbohydrate intake. Switch from Omeprazole to Pantoprazole.

## 2014-04-23 ENCOUNTER — Encounter: Payer: Self-pay | Admitting: Family Medicine

## 2014-07-16 ENCOUNTER — Telehealth: Payer: Self-pay | Admitting: Family Medicine

## 2014-07-16 NOTE — Telephone Encounter (Signed)
Pt ins no longer cover protonix. Pt would like new rx omeprazole 80 mg# 90 w/refills sent to [facility]

## 2014-07-17 MED ORDER — OMEPRAZOLE 40 MG PO CPDR: 40.0000 mg | DELAYED_RELEASE_CAPSULE | Freq: Every day | ORAL | Status: DC

## 2014-07-17 NOTE — Telephone Encounter (Signed)
done

## 2014-07-17 NOTE — Telephone Encounter (Signed)
Script was sent e-scribe, tried to reach pt by phone and no answer.

## 2014-08-12 ENCOUNTER — Telehealth: Payer: Self-pay | Admitting: Family Medicine

## 2014-08-12 NOTE — Telephone Encounter (Signed)
Pt [facility]xs [facility] phone # [phone] lisinopril 20 mg #90,omeprazole 40 mg twice a day #180 and simvastatin 40 mg #90 all with refills

## 2014-08-14 MED ORDER — LISINOPRIL 20 MG PO TABS: ORAL_TABLET | ORAL | Status: DC

## 2014-08-14 MED ORDER — OMEPRAZOLE 40 MG PO CPDR: 40.0000 mg | DELAYED_RELEASE_CAPSULE | Freq: Two times a day (BID) | ORAL | Status: DC

## 2014-08-14 MED ORDER — SIMVASTATIN 40 MG PO TABS: 40.0000 mg | ORAL_TABLET | Freq: Every day | ORAL | Status: DC

## 2014-08-14 NOTE — Telephone Encounter (Signed)
Pt left a message, fax to [phone].

## 2014-08-14 NOTE — Telephone Encounter (Signed)
I left a voice message for pt to call us back with a fax number. I called the phone below and was just put on hold, no one ever picked up the call. We can print these scripts and fax for pt when we get the number.

## 2014-08-14 NOTE — Telephone Encounter (Signed)
I faxed scripts for Lisinopril, Simvastatin, & Omeprazole to below number. Also per Dr. Sarajane Jews change to take Omeprazole bid, which I did.

## 2014-09-07 ENCOUNTER — Ambulatory Visit (INDEPENDENT_AMBULATORY_CARE_PROVIDER_SITE_OTHER): Payer: Managed Care, Other (non HMO) | Admitting: Family Medicine

## 2014-09-07 ENCOUNTER — Encounter: Payer: Self-pay | Admitting: Family Medicine

## 2014-09-07 VITALS — BP 136/84 | HR 88 | Temp 98.2°F | Resp 18 | Ht 73.0 in | Wt 283.0 lb

## 2014-09-07 DIAGNOSIS — J209 Acute bronchitis, unspecified: Secondary | ICD-10-CM

## 2014-09-07 DIAGNOSIS — R739 Hyperglycemia, unspecified: Secondary | ICD-10-CM

## 2014-09-07 LAB — HEMOGLOBIN A1C: Hgb A1c MFr Bld: 6.4 % (ref 4.6–6.5)

## 2014-09-07 MED ORDER — AZITHROMYCIN 250 MG PO TABS: ORAL_TABLET | ORAL | Status: DC

## 2014-09-07 NOTE — Progress Notes (Signed)
Pre visit review using our clinic review tool, if applicable. No additional management support is needed unless otherwise documented below in the visit note. 

## 2014-09-07 NOTE — Progress Notes (Signed)
   Subjective:    Patient ID: Lee Ballard, male    DOB: 1965-11-22, 49 y.o.   MRN: [id number]  HPI Here for one week of PND, ST, and coughing up green sputum. No fever. Also he asks about a mildly elevated glucose we found during his cpx last summer. He has tried to reduce the sweets in his diet but he does not exercise.    Review of Systems  Constitutional: Negative.   HENT: Positive for congestion and postnasal drip. Negative for sinus pressure.   Eyes: Negative.   Respiratory: Positive for cough.        Objective:   Physical Exam  Constitutional: He appears well-developed and well-nourished.  HENT:  Right Ear: External ear normal.  Left Ear: External ear normal.  Nose: Nose normal.  Mouth/Throat: Oropharynx is clear and moist.  Eyes: Conjunctivae are normal.  Cardiovascular: Normal rate, regular rhythm, normal heart sounds and intact distal pulses.   Pulmonary/Chest: Effort normal and breath sounds normal.  Lymphadenopathy:    He has no cervical adenopathy.          Assessment & Plan:  Treat the bronchitis with a Zpack.  check an A1c

## 2015-02-11 ENCOUNTER — Encounter: Payer: Self-pay | Admitting: Internal Medicine

## 2015-04-16 ENCOUNTER — Ambulatory Visit (AMBULATORY_SURGERY_CENTER): Payer: Self-pay | Admitting: *Deleted

## 2015-04-16 VITALS — Ht 73.0 in | Wt 277.0 lb

## 2015-04-16 DIAGNOSIS — Z1211 Encounter for screening for malignant neoplasm of colon: Secondary | ICD-10-CM

## 2015-04-16 MED ORDER — MOVIPREP 100 G PO SOLR
1.0000 | Freq: Once | ORAL | Status: DC
Start: 2015-04-16 — End: 2015-04-29

## 2015-04-16 NOTE — Progress Notes (Signed)
No egg or soy allergy. No anesthesia problems.  NO home O2.  No diet meds.

## 2015-04-29 ENCOUNTER — Encounter: Payer: Self-pay | Admitting: Internal Medicine

## 2015-04-29 ENCOUNTER — Ambulatory Visit (AMBULATORY_SURGERY_CENTER): Payer: Managed Care, Other (non HMO) | Admitting: Internal Medicine

## 2015-04-29 VITALS — BP 147/95 | HR 72 | Temp 97.8°F | Resp 14 | Ht 73.0 in | Wt 277.0 lb

## 2015-04-29 DIAGNOSIS — K5730 Diverticulosis of large intestine without perforation or abscess without bleeding: Secondary | ICD-10-CM | POA: Diagnosis not present

## 2015-04-29 DIAGNOSIS — D128 Benign neoplasm of rectum: Secondary | ICD-10-CM

## 2015-04-29 DIAGNOSIS — Z1211 Encounter for screening for malignant neoplasm of colon: Secondary | ICD-10-CM | POA: Diagnosis not present

## 2015-04-29 DIAGNOSIS — K621 Rectal polyp: Secondary | ICD-10-CM

## 2015-04-29 HISTORY — PX: COLONOSCOPY: SHX174

## 2015-04-29 MED ORDER — SODIUM CHLORIDE 0.9 % IV SOLN: 500.0000 mL | INTRAVENOUS | Status: DC

## 2015-04-29 NOTE — Progress Notes (Signed)
Called to room to assist during endoscopic procedure.  Patient ID and intended procedure confirmed with present staff. Received instructions for my participation in the procedure from the performing physician.  

## 2015-04-29 NOTE — Progress Notes (Signed)
To recovery, report to Mirts, RN, VSS. 

## 2015-04-29 NOTE — Patient Instructions (Signed)
YOU HAD AN ENDOSCOPIC PROCEDURE TODAY AT [facility]:   Refer to the procedure report that was given to you for any specific questions about what was found during the examination.  If the procedure report does not answer your questions, please call your gastroenterologist to clarify.  If you requested that your care partner not be given the details of your procedure findings, then the procedure report has been included in a sealed envelope for you to review at your convenience later.  YOU SHOULD EXPECT: Some feelings of bloating in the abdomen. Passage of more gas than usual.  Walking can help get rid of the air that was put into your GI tract during the procedure and reduce the bloating. If you had a lower endoscopy (such as a colonoscopy or flexible sigmoidoscopy) you may notice spotting of blood in your stool or on the toilet paper. If you underwent a bowel prep for your procedure, you may not have a normal bowel movement for a few days.  Please Note:  You might notice some irritation and congestion in your nose or some drainage.  This is from the oxygen used during your procedure.  There is no need for concern and it should clear up in a day or so.  SYMPTOMS TO REPORT IMMEDIATELY:   Following lower endoscopy (colonoscopy or flexible sigmoidoscopy):  Excessive amounts of blood in the stool  Significant tenderness or worsening of abdominal pains  Swelling of the abdomen that is new, acute  Fever of 100F or higher  For urgent or emergent issues, a gastroenterologist can be reached at any hour by calling [phone].   DIET: Your first meal following the procedure should be a small meal and then it is ok to progress to your normal diet. Heavy or fried foods are harder to digest and may make you feel nauseous or bloated.  Likewise, meals heavy in dairy and vegetables can increase bloating.  Drink plenty of fluids but you should avoid alcoholic beverages for 24  hours.  ACTIVITY:  You should plan to take it easy for the rest of today and you should NOT DRIVE or use heavy machinery until tomorrow (because of the sedation medicines used during the test).    FOLLOW UP: Our staff will call the number listed on your records the next business day following your procedure to check on you and address any questions or concerns that you may have regarding the information given to you following your procedure. If we do not reach you, we will leave a message.  However, if you are feeling well and you are not experiencing any problems, there is no need to return our call.  We will assume that you have returned to your regular daily activities without incident.  If any biopsies were taken you will be contacted by phone or by letter within the next 1-3 weeks.  Please call us at [phone] if you have not heard about the biopsies in 3 weeks.    SIGNATURES/CONFIDENTIALITY: You and/or your care partner have signed paperwork which will be entered into your electronic medical record.  These signatures attest to the fact that that the information above on your After Visit Summary has been reviewed and is understood.  Full responsibility of the confidentiality of this discharge information lies with you and/or your care-partner.  Polyps, diverticulosis, high fiber diet-handouts given  Repeat colonoscopy will be determined by pathology.

## 2015-04-29 NOTE — Op Note (Signed)
[place]  [address] [place], [id number]   COLONOSCOPY PROCEDURE REPORT  PATIENT: Lee Ballard, Lee Ballard  MR#: [id number] BIRTHDATE: Nov 29, 1965 , 48  yrs. old GENDER: male ENDOSCOPIST: Eustace Quail, MD REFERRED WU:[id number] Sarajane Jews, M.D. PROCEDURE DATE:  04/29/2015 PROCEDURE:   Colonoscopy, screening and Colonoscopy with snare polypectomy x 2 First Screening Colonoscopy - Avg.  risk and is 50 yrs.  old or older Yes.  Prior Negative Screening - Now for repeat screening. N/A  History of Adenoma - Now for follow-up colonoscopy & has been > or = to 3 yrs.  N/A  Polyps removed today? Yes ASA CLASS:   Class II INDICATIONS:Screening for colonic neoplasia and Colorectal Neoplasm Risk Assessment for this procedure is average risk. MEDICATIONS: Monitored anesthesia care and Propofol 300 mg IV  DESCRIPTION OF PROCEDURE:   After the risks benefits and alternatives of the procedure were thoroughly explained, informed consent was obtained.  The digital rectal exam revealed no abnormalities of the rectum.   The LB [id number]  endoscope was introduced through the anus and advanced to the cecum, which was identified by both the appendix and ileocecal valve. No adverse events experienced.   The quality of the prep was excellent. (MoviPrep was used)  The instrument was then slowly withdrawn as the colon was fully examined. Estimated blood loss is zero unless otherwise noted in this procedure report.    COLON FINDINGS: Two polyps measuring 3 mm in size were found in the rectum.  A polypectomy was performed with a cold snare.  The resection was complete, the polyp tissue was completely retrieved and sent to histology.   There was moderate diverticulosis noted in the sigmoid colon.   The examination was otherwise normal. Retroflexed views revealed internal hemorrhoids. The time to cecum = 1.2 Withdrawal time = 10.4   The scope was withdrawn and the procedure  completed. COMPLICATIONS: There were no immediate complications.  ENDOSCOPIC IMPRESSION: 1.   Two polyps were found in the rectum; polypectomy was performed with a cold snare 2.   Moderate diverticulosis was noted in the sigmoid colon 3.   The examination was otherwise normal  RECOMMENDATIONS: 1. Repeat colonoscopy in 5 years if polyp adenomatous; otherwise 10 years  eSigned:  Eustace Quail, MD 04/29/2015 9:06 AM   cc: The Patient and Laurey Morale, MD

## 2015-04-30 ENCOUNTER — Telehealth: Payer: Self-pay | Admitting: *Deleted

## 2015-04-30 NOTE — Telephone Encounter (Signed)
  Follow up Call-  Call back number 04/29/2015  Post procedure Call Back phone  # [phone]  Permission to leave phone message Yes     Patient questions:  Message left to call us if necessary.

## 2015-05-04 ENCOUNTER — Encounter: Payer: Self-pay | Admitting: Internal Medicine

## 2015-07-12 ENCOUNTER — Telehealth: Payer: Self-pay | Admitting: Family Medicine

## 2015-07-12 MED ORDER — LISINOPRIL 20 MG PO TABS: ORAL_TABLET | ORAL | Status: DC

## 2015-07-12 NOTE — Telephone Encounter (Signed)
Pt significant other called request RX refill on lisinopril (PRINIVIL,ZESTRIL) 20 MG tablet. Pt has has been schedule for an appointment for Friday for a med follow up but does not have enough to last until Friday.

## 2015-07-12 NOTE — Telephone Encounter (Signed)
I sent script e-scribe and left a voice message for pt. 

## 2015-07-16 ENCOUNTER — Ambulatory Visit (INDEPENDENT_AMBULATORY_CARE_PROVIDER_SITE_OTHER): Payer: Managed Care, Other (non HMO) | Admitting: Family Medicine

## 2015-07-16 ENCOUNTER — Encounter: Payer: Self-pay | Admitting: Family Medicine

## 2015-07-16 VITALS — BP 144/87 | HR 75 | Temp 98.6°F | Ht 73.0 in | Wt 277.0 lb

## 2015-07-16 DIAGNOSIS — I10 Essential (primary) hypertension: Secondary | ICD-10-CM | POA: Diagnosis not present

## 2015-07-16 DIAGNOSIS — E785 Hyperlipidemia, unspecified: Secondary | ICD-10-CM | POA: Diagnosis not present

## 2015-07-16 DIAGNOSIS — R739 Hyperglycemia, unspecified: Secondary | ICD-10-CM | POA: Diagnosis not present

## 2015-07-16 MED ORDER — LISINOPRIL 20 MG PO TABS: ORAL_TABLET | ORAL | Status: DC

## 2015-07-16 MED ORDER — OMEPRAZOLE 40 MG PO CPDR: 40.0000 mg | DELAYED_RELEASE_CAPSULE | Freq: Two times a day (BID) | ORAL | Status: DC

## 2015-07-16 MED ORDER — SIMVASTATIN 40 MG PO TABS: 40.0000 mg | ORAL_TABLET | Freq: Every day | ORAL | Status: DC

## 2015-07-16 MED ORDER — VARDENAFIL HCL 20 MG PO TABS: 20.0000 mg | ORAL_TABLET | ORAL | Status: DC | PRN

## 2015-07-16 NOTE — Progress Notes (Signed)
   Subjective:    Patient ID: Lee Ballard, male    DOB: 1966-02-01, 50 y.o.   MRN: [id number]  HPI Here to follow up. He feels well and his BP is stable at home in the 130s over 80s.   Review of Systems  Constitutional: Negative.   Respiratory: Negative.   Cardiovascular: Negative.   Gastrointestinal: Negative.   Neurological: Negative.        Objective:   Physical Exam  Constitutional: He is oriented to person, place, and time. He appears well-developed and well-nourished.  Neck: No thyromegaly present.  Cardiovascular: Normal rate, regular rhythm, normal heart sounds and intact distal pulses.   Pulmonary/Chest: Effort normal and breath sounds normal.  Lymphadenopathy:    He has no cervical adenopathy.  Neurological: He is alert and oriented to person, place, and time.          Assessment & Plan:  Doing well. His HTN is stable. We will schedule fasting labs soon.

## 2015-07-16 NOTE — Progress Notes (Signed)
Pre visit review using our clinic review tool, if applicable. No additional management support is needed unless otherwise documented below in the visit note. 

## 2015-07-20 ENCOUNTER — Other Ambulatory Visit (INDEPENDENT_AMBULATORY_CARE_PROVIDER_SITE_OTHER): Payer: Managed Care, Other (non HMO)

## 2015-07-20 DIAGNOSIS — I10 Essential (primary) hypertension: Secondary | ICD-10-CM | POA: Diagnosis not present

## 2015-07-20 DIAGNOSIS — R739 Hyperglycemia, unspecified: Secondary | ICD-10-CM

## 2015-07-20 LAB — BASIC METABOLIC PANEL
BUN: 21 mg/dL (ref 6–23)
CALCIUM: 10.4 mg/dL (ref 8.4–10.5)
CHLORIDE: 104 meq/L (ref 96–112)
CO2: 26 meq/L (ref 19–32)
Creatinine, Ser: 1.15 mg/dL (ref 0.40–1.50)
GFR: 71.22 mL/min (ref >60.00)
Glucose, Bld: 124 mg/dL — ABNORMAL HIGH (ref 70–99)
POTASSIUM: 4.7 meq/L (ref 3.5–5.1)
SODIUM: 139 meq/L (ref 135–145)

## 2015-07-20 LAB — CBC WITH DIFFERENTIAL/PLATELET
BASOS PCT: 0.3 % (ref 0.0–3.0)
Basophils Absolute: 0 10*3/uL (ref 0.0–0.1)
EOS PCT: 1.7 % (ref 0.0–5.0)
Eosinophils Absolute: 0.2 10*3/uL (ref 0.0–0.7)
HEMATOCRIT: 48.1 % (ref 39.0–52.0)
HEMOGLOBIN: 16 g/dL (ref 13.0–17.0)
Lymphocytes Relative: 27.5 % (ref 12.0–46.0)
Lymphs Abs: 3 10*3/uL (ref 0.7–4.0)
MCHC: 33.2 g/dL (ref 30.0–36.0)
MCV: 91.4 fl (ref 78.0–100.0)
Monocytes Absolute: 0.9 10*3/uL (ref 0.1–1.0)
Monocytes Relative: 8.1 % (ref 3.0–12.0)
Neutro Abs: 6.7 10*3/uL (ref 1.4–7.7)
Neutrophils Relative %: 62.4 % (ref 43.0–77.0)
Platelets: 348 10*3/uL (ref 150.0–400.0)
RBC: 5.27 Mil/uL (ref 4.22–5.81)
RDW: 14.7 % (ref 11.5–15.5)
WBC: 10.8 10*3/uL — AB (ref 4.0–10.5)

## 2015-07-20 LAB — POCT URINALYSIS DIPSTICK
Bilirubin, UA: NEGATIVE
Glucose, UA: NEGATIVE
KETONES UA: NEGATIVE
Leukocytes, UA: NEGATIVE
Nitrite, UA: NEGATIVE
PH UA: 6
PROTEIN UA: NEGATIVE
RBC UA: NEGATIVE
Urobilinogen, UA: 0.2

## 2015-07-20 LAB — TSH: TSH: 3.38 u[IU]/mL (ref 0.35–4.50)

## 2015-07-20 LAB — LIPID PANEL
Cholesterol: 164 mg/dL (ref 0–200)
HDL: 32.2 mg/dL — AB (ref >39.00)
NONHDL: 132.03
Total CHOL/HDL Ratio: 5
Triglycerides: 292 mg/dL — ABNORMAL HIGH (ref 0.0–149.0)
VLDL: 58.4 mg/dL — ABNORMAL HIGH (ref 0.0–40.0)

## 2015-07-20 LAB — HEPATIC FUNCTION PANEL
ALK PHOS: 124 U/L — AB (ref 39–117)
ALT: 39 U/L (ref 0–53)
AST: 29 U/L (ref 0–37)
Albumin: 4.4 g/dL (ref 3.5–5.2)
BILIRUBIN DIRECT: 0.1 mg/dL (ref 0.0–0.3)
TOTAL PROTEIN: 7 g/dL (ref 6.0–8.3)
Total Bilirubin: 0.4 mg/dL (ref 0.2–1.2)

## 2015-07-20 LAB — LDL CHOLESTEROL, DIRECT: Direct LDL: 100 mg/dL

## 2015-07-20 LAB — HEMOGLOBIN A1C: Hgb A1c MFr Bld: 6.1 % (ref 4.6–6.5)

## 2015-07-20 LAB — PSA: PSA: 0.64 ng/mL (ref 0.10–4.00)

## 2015-09-15 ENCOUNTER — Ambulatory Visit (INDEPENDENT_AMBULATORY_CARE_PROVIDER_SITE_OTHER): Payer: Managed Care, Other (non HMO) | Admitting: Adult Health

## 2015-09-15 ENCOUNTER — Encounter: Payer: Self-pay | Admitting: Adult Health

## 2015-09-15 VITALS — BP 110/82 | HR 97 | Temp 99.1°F | Ht 73.0 in | Wt 276.9 lb

## 2015-09-15 DIAGNOSIS — J069 Acute upper respiratory infection, unspecified: Secondary | ICD-10-CM | POA: Diagnosis not present

## 2015-09-15 MED ORDER — METHYLPREDNISOLONE 4 MG PO TBPK: ORAL_TABLET | ORAL | Status: DC

## 2015-09-15 NOTE — Progress Notes (Signed)
Subjective:    Patient ID: Lee Ballard, male    DOB: [birth year], 50 y.o.   MRN: [id number]  HPI  50 year old male who presents to the office today for an acute issue of productive cough with chest congestion for two days. He has been using Mucinex which helps with the cough slightly. He denies any fevers at home, sinus pain and pressure, fevers, nausea, vomiting, diarrhea. He has many sick contacts with the same symptoms at work.     Review of Systems  Constitutional: Positive for fatigue. Negative for fever, chills, diaphoresis and activity change.  HENT: Positive for congestion. Negative for ear discharge, ear pain, postnasal drip, sinus pressure, sneezing, sore throat, tinnitus and trouble swallowing.   Respiratory: Positive for cough. Negative for shortness of breath and wheezing.   Cardiovascular: Negative.   Neurological: Negative.   Psychiatric/Behavioral: Negative.   All other systems reviewed and are negative.  Past Medical History  Diagnosis Date  . Hyperlipidemia   . Hypertension   . Erectile dysfunction   . GERD (gastroesophageal reflux disease)   . Varicose vein of leg   . Edema leg     Social History   Social History  . Marital Status: Divorced    Spouse Name: N/A  . Number of Children: N/A  . Years of Education: N/A   Occupational History  . Not on file.   Social History Main Topics  . Smoking status: Never Smoker   . Smokeless tobacco: Never Used  . Alcohol Use: No  . Drug Use: No  . Sexual Activity: Not on file   Other Topics Concern  . Not on file   Social History Narrative    Past Surgical History  Procedure Laterality Date  . Endovenous ablation saphenous vein w/ laser Right   . Tonsillectomy      Family History  Problem Relation Age of Onset  . Colon cancer Neg Hx     No Known Allergies  Current Outpatient Prescriptions on File Prior to Visit  Medication Sig Dispense Refill  . Calcium 200 MG TABS Take 400 mg by mouth daily.     Marland Kitchen ibuprofen (ADVIL,MOTRIN) 200 MG tablet Take 200 mg by mouth every 6 (six) hours as needed.    Marland Kitchen lisinopril (PRINIVIL,ZESTRIL) 20 MG tablet Take one tablet by mouth one time daily 90 tablet 3  . Magnesium 400 MG CAPS Take by mouth.    . Multiple Vitamins-Minerals (MULTIVITAMIN,TX-MINERALS) tablet Take 1 tablet by mouth daily.      . Omega-3 Fatty Acids (FISH OIL) 1200 MG CAPS Take 1,200 mg by mouth.    Marland Kitchen omeprazole (PRILOSEC) 40 MG capsule Take 1 capsule (40 mg total) by mouth 2 (two) times daily. 180 capsule 3  . POTASSIUM PO Take 400 mg by mouth daily.    . simvastatin (ZOCOR) 40 MG tablet Take 1 tablet (40 mg total) by mouth at bedtime. 90 tablet 3  . vardenafil (LEVITRA) 20 MG tablet Take 1 tablet (20 mg total) by mouth as needed for erectile dysfunction. 30 tablet 3   No current facility-administered medications on file prior to visit.    BP 110/82 mmHg  Pulse 97  Temp(Src) 99.1 F (37.3 C) (Oral)  Ht 6\' 1"  (1.854 m)  Wt 276 lb 14.4 oz (125.601 kg)  BMI 36.54 kg/m2  SpO2 97%       Objective:   Physical Exam  Constitutional: He is oriented to person, place, and time. He appears well-developed and  well-nourished. No distress.  Neck: Normal range of motion. Neck supple.  Cardiovascular: Normal rate, regular rhythm, normal heart sounds and intact distal pulses.  Exam reveals no gallop and no friction rub.   No murmur heard. Pulmonary/Chest: Effort normal and breath sounds normal. No respiratory distress. He has no wheezes. He has no rales. He exhibits no tenderness.  Lymphadenopathy:    He has no cervical adenopathy.  Neurological: He is alert and oriented to person, place, and time.  Skin: Skin is warm and dry. No rash noted. He is not diaphoretic. No erythema. No pallor.  Psychiatric: He has a normal mood and affect. His behavior is normal. Judgment and thought content normal.  Vitals reviewed.      Assessment & Plan:  1. Acute upper respiratory infection - Likely  viral in nature. Will prescribe prednisone to help with the cough. He is to continue to take Mucinex. Stay hydrated and rest as much as possible.  - methylPREDNISolone (MEDROL DOSEPAK) 4 MG TBPK tablet; Take as directed  Dispense: 21 tablet; Refill: 0 - Follow up if no improvement in the next 2-3 days.

## 2015-09-15 NOTE — Progress Notes (Signed)
Pre visit review using our clinic review tool, if applicable. No additional management support is needed unless otherwise documented below in the visit note. 

## 2015-09-15 NOTE — Patient Instructions (Addendum)
It was great meeting you today!  Your exam is consistent with a viral upper respiratory infection. Continue with the Mucinex and take the prednisone as directed.   Stay well hydrated and rest as much as possible.   Follow up if symptoms do not start to improve after day 5-6 or if symptoms get worse.     Upper Respiratory Infection, Adult Most upper respiratory infections (URIs) are a viral infection of the air passages leading to the lungs. A URI affects the nose, throat, and upper air passages. The most common type of URI is nasopharyngitis and is typically referred to as "the common cold." URIs run their course and usually go away on their own. Most of the time, a URI does not require medical attention, but sometimes a bacterial infection in the upper airways can follow a viral infection. This is called a secondary infection. Sinus and middle ear infections are common types of secondary upper respiratory infections. Bacterial pneumonia can also complicate a URI. A URI can worsen asthma and chronic obstructive pulmonary disease (COPD). Sometimes, these complications can require emergency medical care and may be life threatening.  CAUSES Almost all URIs are caused by viruses. A virus is a type of germ and can spread from one person to another.  RISKS FACTORS You may be at risk for a URI if:   You smoke.   You have chronic heart or lung disease.  You have a weakened defense (immune) system.   You are very young or very old.   You have nasal allergies or asthma.  You work in crowded or poorly ventilated areas.  You work in health care facilities or schools. SIGNS AND SYMPTOMS  Symptoms typically develop 2-3 days after you come in contact with a cold virus. Most viral URIs last 7-10 days. However, viral URIs from the influenza virus (flu virus) can last 14-18 days and are typically more severe. Symptoms may include:   Runny or stuffy (congested) nose.   Sneezing.   Cough.    Sore throat.   Headache.   Fatigue.   Fever.   Loss of appetite.   Pain in your forehead, behind your eyes, and over your cheekbones (sinus pain).  Muscle aches.  DIAGNOSIS  Your health care provider may diagnose a URI by:  Physical exam.  Tests to check that your symptoms are not due to another condition such as:  Strep throat.  Sinusitis.  Pneumonia.  Asthma. TREATMENT  A URI goes away on its own with time. It cannot be cured with medicines, but medicines may be prescribed or recommended to relieve symptoms. Medicines may help:  Reduce your fever.  Reduce your cough.  Relieve nasal congestion. HOME CARE INSTRUCTIONS   Take medicines only as directed by your health care provider.   Gargle warm saltwater or take cough drops to comfort your throat as directed by your health care provider.  Use a warm mist humidifier or inhale steam from a shower to increase air moisture. This may make it easier to breathe.  Drink enough fluid to keep your urine clear or pale yellow.   Eat soups and other clear broths and maintain good nutrition.   Rest as needed.   Return to work when your temperature has returned to normal or as your health care provider advises. You may need to stay home longer to avoid infecting others. You can also use a face mask and careful hand washing to prevent spread of the virus.  Increase the  usage of your inhaler if you have asthma.   Do not use any tobacco products, including cigarettes, chewing tobacco, or electronic cigarettes. If you need help quitting, ask your health care provider. PREVENTION  The best way to protect yourself from getting a cold is to practice good hygiene.   Avoid oral or hand contact with people with cold symptoms.   Wash your hands often if contact occurs.  There is no clear evidence that vitamin C, vitamin E, echinacea, or exercise reduces the chance of developing a cold. However, it is always  recommended to get plenty of rest, exercise, and practice good nutrition.  SEEK MEDICAL CARE IF:   You are getting worse rather than better.   Your symptoms are not controlled by medicine.   You have chills.  You have worsening shortness of breath.  You have brown or red mucus.  You have yellow or brown nasal discharge.  You have pain in your face, especially when you bend forward.  You have a fever.  You have swollen neck glands.  You have pain while swallowing.  You have white areas in the back of your throat. SEEK IMMEDIATE MEDICAL CARE IF:   You have severe or persistent:  Headache.  Ear pain.  Sinus pain.  Chest pain.  You have chronic lung disease and any of the following:  Wheezing.  Prolonged cough.  Coughing up blood.  A change in your usual mucus.  You have a stiff neck.  You have changes in your:  Vision.  Hearing.  Thinking.  Mood. MAKE SURE YOU:   Understand these instructions.  Will watch your condition.  Will get help right away if you are not doing well or get worse.   This information is not intended to replace advice given to you by your health care provider. Make sure you discuss any questions you have with your health care provider.   Document Released: 02/14/2001 Document Revised: 01/05/2015 Document Reviewed: 11/26/2013 Elsevier Interactive Patient Education [employer].

## 2016-06-10 ENCOUNTER — Other Ambulatory Visit: Payer: Self-pay | Admitting: Family Medicine

## 2016-07-21 ENCOUNTER — Other Ambulatory Visit: Payer: Self-pay | Admitting: Family Medicine

## 2016-07-31 ENCOUNTER — Other Ambulatory Visit: Payer: Self-pay | Admitting: Family Medicine

## 2016-07-31 NOTE — Telephone Encounter (Signed)
Pt has CPE on 12/12 but does not have enough simvastatin (ZOCOR) 40 MG tablet  to get him through.  Can you send in 30 day to get him through?  [facility] [address]/ [address]

## 2016-08-01 MED ORDER — SIMVASTATIN 40 MG PO TABS: 40.0000 mg | ORAL_TABLET | Freq: Every day | ORAL | 3 refills | Status: DC

## 2016-08-01 NOTE — Telephone Encounter (Signed)
Rx sent to pharmacy. LM that rx has been sent in. Nothing further needed.

## 2016-08-09 ENCOUNTER — Other Ambulatory Visit (INDEPENDENT_AMBULATORY_CARE_PROVIDER_SITE_OTHER): Payer: Managed Care, Other (non HMO)

## 2016-08-09 DIAGNOSIS — R7989 Other specified abnormal findings of blood chemistry: Secondary | ICD-10-CM | POA: Diagnosis not present

## 2016-08-09 DIAGNOSIS — Z0000 Encounter for general adult medical examination without abnormal findings: Secondary | ICD-10-CM

## 2016-08-09 LAB — CBC WITH DIFFERENTIAL/PLATELET
BASOS ABS: 0 10*3/uL (ref 0.0–0.1)
BASOS PCT: 0.4 % (ref 0.0–3.0)
EOS ABS: 0.2 10*3/uL (ref 0.0–0.7)
Eosinophils Relative: 2 % (ref 0.0–5.0)
HEMATOCRIT: 47 % (ref 39.0–52.0)
HEMOGLOBIN: 15.9 g/dL (ref 13.0–17.0)
LYMPHS PCT: 31.2 % (ref 12.0–46.0)
Lymphs Abs: 2.9 10*3/uL (ref 0.7–4.0)
MCHC: 33.8 g/dL (ref 30.0–36.0)
MCV: 89.4 fl (ref 78.0–100.0)
MONOS PCT: 8.3 % (ref 3.0–12.0)
Monocytes Absolute: 0.8 10*3/uL (ref 0.1–1.0)
Neutro Abs: 5.3 10*3/uL (ref 1.4–7.7)
Neutrophils Relative %: 58.1 % (ref 43.0–77.0)
Platelets: 315 10*3/uL (ref 150.0–400.0)
RBC: 5.26 Mil/uL (ref 4.22–5.81)
RDW: 14 % (ref 11.5–15.5)
WBC: 9.2 10*3/uL (ref 4.0–10.5)

## 2016-08-09 LAB — BASIC METABOLIC PANEL
BUN: 16 mg/dL (ref 6–23)
CALCIUM: 9.7 mg/dL (ref 8.4–10.5)
CHLORIDE: 103 meq/L (ref 96–112)
CO2: 27 meq/L (ref 19–32)
CREATININE: 1.08 mg/dL (ref 0.40–1.50)
GFR: 76.26 mL/min (ref >60.00)
Glucose, Bld: 118 mg/dL — ABNORMAL HIGH (ref 70–99)
Potassium: 4.6 mEq/L (ref 3.5–5.1)
SODIUM: 138 meq/L (ref 135–145)

## 2016-08-09 LAB — HEPATIC FUNCTION PANEL
ALBUMIN: 4.2 g/dL (ref 3.5–5.2)
ALK PHOS: 115 U/L (ref 39–117)
ALT: 25 U/L (ref 0–53)
AST: 19 U/L (ref 0–37)
BILIRUBIN DIRECT: 0.1 mg/dL (ref 0.0–0.3)
TOTAL PROTEIN: 6.7 g/dL (ref 6.0–8.3)
Total Bilirubin: 0.6 mg/dL (ref 0.2–1.2)

## 2016-08-09 LAB — LIPID PANEL
Cholesterol: 148 mg/dL (ref 0–200)
HDL: 32.5 mg/dL — ABNORMAL LOW (ref >39.00)
NONHDL: 115.02
Total CHOL/HDL Ratio: 5
Triglycerides: 226 mg/dL — ABNORMAL HIGH (ref 0.0–149.0)
VLDL: 45.2 mg/dL — ABNORMAL HIGH (ref 0.0–40.0)

## 2016-08-09 LAB — POC URINALSYSI DIPSTICK (AUTOMATED)
Bilirubin, UA: NEGATIVE
Blood, UA: NEGATIVE
GLUCOSE UA: NEGATIVE
Ketones, UA: NEGATIVE
Leukocytes, UA: NEGATIVE
Nitrite, UA: NEGATIVE
PROTEIN UA: NEGATIVE
SPEC GRAV UA: 1.025
UROBILINOGEN UA: 0.2
pH, UA: 6.5

## 2016-08-09 LAB — TSH: TSH: 3.49 u[IU]/mL (ref 0.35–4.50)

## 2016-08-09 LAB — PSA: PSA: 0.8 ng/mL (ref 0.10–4.00)

## 2016-08-09 LAB — LDL CHOLESTEROL, DIRECT: LDL DIRECT: 86 mg/dL

## 2016-08-15 ENCOUNTER — Encounter: Payer: Managed Care, Other (non HMO) | Admitting: Family Medicine

## 2016-09-06 ENCOUNTER — Ambulatory Visit (INDEPENDENT_AMBULATORY_CARE_PROVIDER_SITE_OTHER): Payer: Managed Care, Other (non HMO) | Admitting: Family Medicine

## 2016-09-06 ENCOUNTER — Encounter: Payer: Self-pay | Admitting: Family Medicine

## 2016-09-06 VITALS — BP 153/92 | HR 81 | Temp 97.9°F | Ht 72.0 in | Wt 258.0 lb

## 2016-09-06 DIAGNOSIS — Z0000 Encounter for general adult medical examination without abnormal findings: Secondary | ICD-10-CM

## 2016-09-06 DIAGNOSIS — Z8249 Family history of ischemic heart disease and other diseases of the circulatory system: Secondary | ICD-10-CM | POA: Diagnosis not present

## 2016-09-06 MED ORDER — LISINOPRIL 20 MG PO TABS: 20.0000 mg | ORAL_TABLET | Freq: Every day | ORAL | 3 refills | Status: DC

## 2016-09-06 MED ORDER — OMEPRAZOLE 40 MG PO CPDR: 40.0000 mg | DELAYED_RELEASE_CAPSULE | Freq: Two times a day (BID) | ORAL | 3 refills | Status: DC

## 2016-09-06 MED ORDER — VARDENAFIL HCL 20 MG PO TABS: 20.0000 mg | ORAL_TABLET | ORAL | 11 refills | Status: DC | PRN

## 2016-09-06 NOTE — Progress Notes (Signed)
   Subjective:    Patient ID: Lee Ballard, male    DOB: 10-25-1965, 51 y.o.   MRN: [id number]  HPI 51 yr old male for a well exam. He feels fine but he asks for a referral to a Cardiologist to check his heart status. He has a strong family hx of heart disease and his father died of an MI at age 57. Several paternal uncles also had heart disease at young ages. He checks his BP frequently at home and it usually runs in the 130s over 80s.    Review of Systems  Constitutional: Negative.   HENT: Negative.   Eyes: Negative.   Respiratory: Negative.   Cardiovascular: Negative.   Gastrointestinal: Negative.   Genitourinary: Negative.   Musculoskeletal: Negative.   Skin: Negative.   Neurological: Negative.   Psychiatric/Behavioral: Negative.        Objective:   Physical Exam  Constitutional: He is oriented to person, place, and time. He appears well-developed and well-nourished. No distress.  HENT:  Head: Normocephalic and atraumatic.  Right Ear: External ear normal.  Left Ear: External ear normal.  Nose: Nose normal.  Mouth/Throat: Oropharynx is clear and moist. No oropharyngeal exudate.  Eyes: Conjunctivae and EOM are normal. Pupils are equal, round, and reactive to light. Right eye exhibits no discharge. Left eye exhibits no discharge. No scleral icterus.  Neck: Neck supple. No JVD present. No tracheal deviation present. No thyromegaly present.  Cardiovascular: Normal rate, regular rhythm, normal heart sounds and intact distal pulses.  Exam reveals no gallop and no friction rub.   No murmur heard. Pulmonary/Chest: Effort normal and breath sounds normal. No respiratory distress. He has no wheezes. He has no rales. He exhibits no tenderness.  Abdominal: Soft. Bowel sounds are normal. He exhibits no distension and no mass. There is no tenderness. There is no rebound and no guarding.  Genitourinary: Rectum normal, prostate normal and penis normal. Rectal exam shows guaiac negative stool.  No penile tenderness.  Musculoskeletal: Normal range of motion. He exhibits no edema or tenderness.  Lymphadenopathy:    He has no cervical adenopathy.  Neurological: He is alert and oriented to person, place, and time. He has normal reflexes. No cranial nerve deficit. He exhibits normal muscle tone. Coordination normal.  Skin: Skin is warm and dry. No rash noted. He is not diaphoretic. No erythema. No pallor.  Psychiatric: He has a normal mood and affect. His behavior is normal. Judgment and thought content normal.          Assessment & Plan:  Well exam. We discussed diet and exercise. Refer to Cardiology for cardiac risk assessment.  Alysia Penna, MD

## 2016-09-06 NOTE — Progress Notes (Signed)
Pre visit review using our clinic review tool, if applicable. No additional management support is needed unless otherwise documented below in the visit note. 

## 2016-09-28 ENCOUNTER — Encounter (INDEPENDENT_AMBULATORY_CARE_PROVIDER_SITE_OTHER): Payer: Self-pay

## 2016-09-28 ENCOUNTER — Encounter: Payer: Self-pay | Admitting: Cardiology

## 2016-09-28 ENCOUNTER — Ambulatory Visit (INDEPENDENT_AMBULATORY_CARE_PROVIDER_SITE_OTHER): Payer: Managed Care, Other (non HMO) | Admitting: Cardiology

## 2016-09-28 VITALS — BP 130/90 | HR 80 | Ht 73.0 in | Wt 259.0 lb

## 2016-09-28 DIAGNOSIS — I517 Cardiomegaly: Secondary | ICD-10-CM

## 2016-09-28 DIAGNOSIS — Z8249 Family history of ischemic heart disease and other diseases of the circulatory system: Secondary | ICD-10-CM

## 2016-09-28 DIAGNOSIS — I10 Essential (primary) hypertension: Secondary | ICD-10-CM

## 2016-09-28 DIAGNOSIS — E7800 Pure hypercholesterolemia, unspecified: Secondary | ICD-10-CM

## 2016-09-28 DIAGNOSIS — G4719 Other hypersomnia: Secondary | ICD-10-CM | POA: Diagnosis not present

## 2016-09-28 HISTORY — DX: Family history of ischemic heart disease and other diseases of the circulatory system: Z82.49

## 2016-09-28 MED ORDER — LISINOPRIL 30 MG PO TABS: 30.0000 mg | ORAL_TABLET | Freq: Every day | ORAL | 11 refills | Status: DC

## 2016-09-28 NOTE — Progress Notes (Signed)
Cardiology Office Note    Date:  09/28/2016   ID:  Lee Ballard, DOB February 17, 1966, MRN [id number]  PCP:  Alysia Penna, MD  Cardiologist:  Fransico Him, MD   Chief Complaint  Patient presents with  . New Evaluation    family history of CAD    History of Present Illness:  Lee Ballard is a 51 y.o. male with a history of HTN, Hyperlipidemia and LE edema who has a strong family history of CAD with his father dying of an MI at 59 and several paternal uncles with heart disease at a young age.  He requested a referral to Cardiology to evaluate his cardiac risk. He denies any chest pain or pressure, SOB, DOE (except with extreme exertion),  LE edema, dizziness, palpitations, claudication or syncope. His wife says that he is snoring and having episodes of apnea.  He feels sleepy in the am sometimes but does not have daytime sleepiness.   Past Medical History:  Diagnosis Date  . Edema leg   . Erectile dysfunction   . Family history of premature CAD 09/28/2016  . GERD (gastroesophageal reflux disease)   . Hyperlipidemia   . Hypertension   . Varicose vein of leg     Past Surgical History:  Procedure Laterality Date  . COLONOSCOPY  04/29/2015   per Dr. Henrene Pastor, hyperplastic polyps, repeat in 10 yrs   . ENDOVENOUS ABLATION SAPHENOUS VEIN W/ LASER Right   . TONSILLECTOMY      Current Medications: Outpatient Medications Prior to Visit  Medication Sig Dispense Refill  . Calcium 200 MG TABS Take 400 mg by mouth daily.    Marland Kitchen ibuprofen (ADVIL,MOTRIN) 200 MG tablet Take 200 mg by mouth every 6 (six) hours as needed.    Marland Kitchen lisinopril (PRINIVIL,ZESTRIL) 20 MG tablet Take 1 tablet (20 mg total) by mouth daily. 90 tablet 3  . Magnesium 400 MG CAPS Take by mouth.    . Multiple Vitamins-Minerals (MULTIVITAMIN,TX-MINERALS) tablet Take 1 tablet by mouth daily.      . Omega-3 Fatty Acids (FISH OIL) 1200 MG CAPS Take 1,200 mg by mouth.    Marland Kitchen omeprazole (PRILOSEC) 40 MG capsule Take 1 capsule (40 mg total)  by mouth 2 (two) times daily. 180 capsule 3  . POTASSIUM PO Take 400 mg by mouth daily.    . simvastatin (ZOCOR) 40 MG tablet Take 1 tablet (40 mg total) by mouth at bedtime. 90 tablet 3  . vardenafil (LEVITRA) 20 MG tablet Take 1 tablet (20 mg total) by mouth as needed for erectile dysfunction. 3 tablet 11   No facility-administered medications prior to visit.      Allergies:   Patient has no known allergies.   Social History   Social History  . Marital status: Divorced    Spouse name: N/A  . Number of children: N/A  . Years of education: N/A   Social History Main Topics  . Smoking status: Never Smoker  . Smokeless tobacco: Never Used  . Alcohol use No  . Drug use: No  . Sexual activity: Not Asked   Other Topics Concern  . None   Social History Narrative  . None     Family History:  The patient's family history includes Cancer in his mother; Heart attack (age of onset: 13) in his father.   ROS:   Please see the history of present illness.    ROS All other systems reviewed and are negative.  No flowsheet data found.  PHYSICAL EXAM:   VS:  BP 130/90   Pulse 80   Ht 6\' 1"  (1.854 m)   Wt 259 lb (117.5 kg)   SpO2 98%   BMI 34.17 kg/m    GEN: Well nourished, well developed, in no acute distress  HEENT: normal  Neck: no JVD, carotid bruits, or masses Cardiac: RRR; no murmurs, rubs, or gallops,no edema.  Intact distal pulses bilaterally.  Respiratory:  clear to auscultation bilaterally, normal work of breathing GI: soft, nontender, nondistended, + BS MS: no deformity or atrophy  Skin: warm and dry, no rash Neuro:  Alert and Oriented x 3, Strength and sensation are intact Psych: euthymic mood, full affect  Wt Readings from Last 3 Encounters:  09/28/16 259 lb (117.5 kg)  09/06/16 258 lb (117 kg)  09/15/15 276 lb 14.4 oz (125.6 kg)      Studies/Labs Reviewed:   EKG:  EKG is not ordered today.    Recent Labs: 08/09/2016: ALT 25; BUN 16; Creatinine, Ser  1.08; Hemoglobin 15.9; Platelets 315.0; Potassium 4.6; Sodium 138; TSH 3.49   Lipid Panel    Component Value Date/Time   CHOL 148 08/09/2016 0827   TRIG 226.0 (H) 08/09/2016 0827   HDL 32.50 (L) 08/09/2016 0827   CHOLHDL 5 08/09/2016 0827   VLDL 45.2 (H) 08/09/2016 0827   LDLCALC 73 03/10/2014 0936   LDLDIRECT 86.0 08/09/2016 0827    Additional studies/ records that were reviewed today include:  Office visit notes from PCP    ASSESSMENT:    1. Family history of premature CAD   2. Essential hypertension   3. Pure hypercholesterolemia   4. Excessive daytime sleepiness      PLAN:  In order of problems listed above:  1. Family history of premature CAD with his father dying of an MI at 22yo.  He is asymptomatic but has CRFs including HTN, hyperlipidemia and family history.  EKG is nonischemic.  I will set him up for an ETT to rule out ischemia and Chest CT for coronary calcium score to assess future risk. I encouraged him to get into a routine exercise program if his ETT is normal. 2. HTN - BP borderline controlled on current meds.  He has evidence of LVH on his EKG. I will get an echo to assess degree of LVH.  Continue ACE I and increase to 30mg  daily and check BMET in 1 week. 3. Hyperlipidemia - followed by PCP.  If he has a significant coronary calcium score he will need an LDL goal < 70. Continue statin. 4. Excessive daytime sleepiness with snoring - I will get a home sleep study to evaluate for OSA.     Medication Adjustments/Labs and Tests Ordered: Current medicines are reviewed at length with the patient today.  Concerns regarding medicines are outlined above.  Medication changes, Labs and Tests ordered today are listed in the Patient Instructions below.  There are no Patient Instructions on file for this visit.   Signed, Fransico Him, MD  09/28/2016 2:35 PM    [facility] [place], [place], [place]  [address] Phone: [phone]; Fax:  [phone]

## 2016-09-28 NOTE — Patient Instructions (Signed)
Medication Instructions:  1) INCREASE LISINOPRIL to 30 mg daily  Labwork: IN ONE WEEK: BMET  Testing/Procedures: Your physician has requested that you have an echocardiogram. Echocardiography is a painless test that uses sound waves to create images of your heart. It provides your doctor with information about the size and shape of your heart and how well your heart's chambers and valves are working. This procedure takes approximately one hour. There are no restrictions for this procedure.   Your physician has requested that you have an exercise tolerance test. For further information please visit [email or web address]. Please also follow instruction sheet, as given.  Dr. Radford Pax recommends you have a CORONARY CT.  Dr. Radford Pax recommends you have a [place]. You will be called to arrange this.  Follow-Up: Your physician wants you to follow-up in: 1 year with Dr. Radford Pax. You will receive a reminder letter in the mail two months in advance. If you don't receive a letter, please call our office to schedule the follow-up appointment.   Any Other Special Instructions Will Be Listed Below (If Applicable).     If you need a refill on your cardiac medications before your next appointment, please call your pharmacy.

## 2016-10-12 ENCOUNTER — Other Ambulatory Visit: Payer: Managed Care, Other (non HMO) | Admitting: *Deleted

## 2016-10-12 ENCOUNTER — Ambulatory Visit (INDEPENDENT_AMBULATORY_CARE_PROVIDER_SITE_OTHER)
Admission: RE | Admit: 2016-10-12 | Discharge: 2016-10-12 | Disposition: A | Discharge status: Home / Self Care | Payer: Self-pay | Source: Ambulatory Visit | Attending: Cardiology | Admitting: Cardiology

## 2016-10-12 ENCOUNTER — Ambulatory Visit (HOSPITAL_COMMUNITY): Payer: Managed Care, Other (non HMO)

## 2016-10-12 ENCOUNTER — Ambulatory Visit (INDEPENDENT_AMBULATORY_CARE_PROVIDER_SITE_OTHER): Payer: Managed Care, Other (non HMO)

## 2016-10-12 DIAGNOSIS — I10 Essential (primary) hypertension: Secondary | ICD-10-CM

## 2016-10-12 DIAGNOSIS — Z8249 Family history of ischemic heart disease and other diseases of the circulatory system: Secondary | ICD-10-CM

## 2016-10-12 LAB — BASIC METABOLIC PANEL
BUN/Creatinine Ratio: 16 (ref 9–20)
BUN: 23 mg/dL (ref 6–24)
CHLORIDE: 98 mmol/L (ref 96–106)
CO2: 11 mmol/L — AB (ref 18–29)
CREATININE: 1.46 mg/dL — AB (ref 0.76–1.27)
Calcium: 9.3 mg/dL (ref 8.7–10.2)
GFR calc Af Amer: 63 mL/min/{1.73_m2} (ref >59)
GFR calc non Af Amer: 55 mL/min/{1.73_m2} — ABNORMAL LOW (ref >59)
GLUCOSE: 113 mg/dL — AB (ref 65–99)
Potassium: 4.1 mmol/L (ref 3.5–5.2)
SODIUM: 138 mmol/L (ref 134–144)

## 2016-10-13 LAB — EXERCISE TOLERANCE TEST
CHL CUP MPHR: 168 {beats}/min
CSEPEDS: 30 s
CSEPPHR: 148 {beats}/min
Estimated workload: 10.4 METS
Exercise duration (min): 9 min
Percent HR: 88 %
RPE: 15
Rest HR: 78 {beats}/min

## 2016-10-16 ENCOUNTER — Telehealth: Payer: Self-pay

## 2016-10-16 DIAGNOSIS — I10 Essential (primary) hypertension: Secondary | ICD-10-CM

## 2016-10-16 MED ORDER — ASPIRIN EC 81 MG PO TBEC: 81.0000 mg | DELAYED_RELEASE_TABLET | Freq: Every day | ORAL | 3 refills | Status: AC

## 2016-10-16 NOTE — Telephone Encounter (Signed)
-----   Message from Sueanne Margarita, MD sent at 10/12/2016  5:05 PM EST ----- Creatinine increased - please have patient stop NSAIDs and repeat BMET in 1 weeks

## 2016-10-16 NOTE — Telephone Encounter (Signed)
Informed patient of results and verbal understanding expressed.  Patient states he was not feeling well the day before and day of his lab drawn and was not drinking or eating. Patient understands to stay hydrated and limit NSAIDs (he takes in the morning for joint pain).  Repeat BMET scheduled 2/22. Encouraged aggressive risk factor modification. Instructed patient to START ASA 81 mg daily. Recall placed to see Dr. Radford Pax back in 1 year. FLP requested from PCP.  Patient agrees with treatment plan.

## 2016-10-16 NOTE — Telephone Encounter (Signed)
-----   Message from Sueanne Margarita, MD sent at 10/12/2016 10:17 PM EST ----- Coronary Ca score is in the 75th percentile for his age and sex.  Needs aggressive risk factor modification.  Please get last FLP from PCP.  Start ASA 81mg  daily.  Followup with me in 1 year

## 2016-10-19 ENCOUNTER — Telehealth: Payer: Self-pay

## 2016-10-19 DIAGNOSIS — E785 Hyperlipidemia, unspecified: Secondary | ICD-10-CM

## 2016-10-19 MED ORDER — ROSUVASTATIN CALCIUM 40 MG PO TABS: 40.0000 mg | ORAL_TABLET | Freq: Every day | ORAL | 3 refills | Status: DC

## 2016-10-19 NOTE — Telephone Encounter (Signed)
Instructed patient to STOP SIMVASTATIN and START CRESTOR 40 mg daily. FLP and ALT scheduled 4/27 as patient requests Rx to be sent to [facility] in [place]. He agrees with treatment plan.

## 2016-10-19 NOTE — Telephone Encounter (Signed)
-----   Message from Sueanne Margarita, MD sent at 10/16/2016  9:41 AM EST ----- LDL is 86 and goal is < 70.  Triglycerides are also too high. Please change simvastatin to Crestor 40mg  daily and repeat FLP and ALT in 6 weeks

## 2016-10-26 ENCOUNTER — Other Ambulatory Visit: Payer: Managed Care, Other (non HMO)

## 2016-11-01 ENCOUNTER — Other Ambulatory Visit: Payer: Self-pay | Admitting: Family Medicine

## 2016-11-01 MED ORDER — OMEPRAZOLE 40 MG PO CPDR: 40.0000 mg | DELAYED_RELEASE_CAPSULE | Freq: Two times a day (BID) | ORAL | 3 refills | Status: DC

## 2016-11-01 NOTE — Telephone Encounter (Signed)
Refill request for Omeprazole 40 mg take 1 po bid and a 90 day supply to [facility] order. I did send script e-scribe to this pharmacy.

## 2016-11-17 ENCOUNTER — Encounter: Payer: Self-pay | Admitting: Cardiology

## 2016-11-22 ENCOUNTER — Telehealth: Payer: Self-pay | Admitting: *Deleted

## 2016-11-22 DIAGNOSIS — G4719 Other hypersomnia: Secondary | ICD-10-CM

## 2016-11-22 NOTE — Telephone Encounter (Signed)
Called the patient to give sleep study results, left a message to call back

## 2016-11-22 NOTE — Telephone Encounter (Signed)
-----   Message from Sueanne Margarita, MD sent at 11/20/2016  4:13 PM EDT ----- He has mild OSA on HST with an AHI of 8/hr and oxygen desaturations as low as 76%.  He has excessive daytime sleepiness.  Please order in lab CPAP titration.

## 2016-12-01 ENCOUNTER — Encounter: Payer: Self-pay | Admitting: Cardiology

## 2016-12-14 ENCOUNTER — Telehealth: Payer: Self-pay | Admitting: *Deleted

## 2016-12-14 NOTE — Addendum Note (Signed)
Addended by: Freada Bergeron on: 12/14/2016 04:57 PM   Modules accepted: Orders

## 2016-12-15 ENCOUNTER — Encounter: Payer: Self-pay | Admitting: Cardiology

## 2016-12-29 ENCOUNTER — Other Ambulatory Visit: Payer: Managed Care, Other (non HMO) | Admitting: *Deleted

## 2016-12-29 DIAGNOSIS — E785 Hyperlipidemia, unspecified: Secondary | ICD-10-CM

## 2016-12-29 LAB — LIPID PANEL
CHOLESTEROL TOTAL: 132 mg/dL (ref 100–199)
Chol/HDL Ratio: 4 ratio (ref 0.0–5.0)
HDL: 33 mg/dL — ABNORMAL LOW (ref >39)
LDL CALC: 58 mg/dL (ref 0–99)
Triglycerides: 205 mg/dL — ABNORMAL HIGH (ref 0–149)
VLDL Cholesterol Cal: 41 mg/dL — ABNORMAL HIGH (ref 5–40)

## 2016-12-29 LAB — ALT: ALT: 29 IU/L (ref 0–44)

## 2017-01-02 ENCOUNTER — Telehealth: Payer: Self-pay

## 2017-01-02 MED ORDER — SIMVASTATIN 40 MG PO TABS: 40.0000 mg | ORAL_TABLET | Freq: Every day | ORAL | 3 refills | Status: DC

## 2017-01-02 NOTE — Telephone Encounter (Signed)
Informed patient of results and verbal understanding expressed.  Patient states he stopped Crestor shortly after starting because he was getting headaches.  He restarted Simvastatin 40 mg daily and drastically changed his diet. Since he is at goal with Simvastatin and diet changes, he will stay on this regimen. Refills called in. He was grateful for assistance.

## 2017-01-02 NOTE — Telephone Encounter (Signed)
-----   Message from Sueanne Margarita, MD sent at 12/30/2016 11:25 PM EDT ----- Stable labs - continue current meds

## 2017-01-12 ENCOUNTER — Ambulatory Visit (HOSPITAL_COMMUNITY): Payer: Managed Care, Other (non HMO) | Attending: Cardiology

## 2017-01-12 ENCOUNTER — Other Ambulatory Visit: Payer: Self-pay

## 2017-01-12 ENCOUNTER — Encounter (INDEPENDENT_AMBULATORY_CARE_PROVIDER_SITE_OTHER): Payer: Self-pay

## 2017-01-12 DIAGNOSIS — I517 Cardiomegaly: Secondary | ICD-10-CM

## 2017-01-12 NOTE — Telephone Encounter (Signed)
Called patient LMTCB. 

## 2017-04-16 ENCOUNTER — Encounter: Payer: Self-pay | Admitting: *Deleted

## 2017-04-16 NOTE — Telephone Encounter (Signed)
LMTCB  04/16/17.

## 2017-04-16 NOTE — Telephone Encounter (Signed)
No contact letter sent to patient today 04/16/17.

## 2017-08-07 ENCOUNTER — Other Ambulatory Visit: Payer: Self-pay | Admitting: Family Medicine

## 2017-08-07 NOTE — Telephone Encounter (Signed)
Sent to PCP for approval. Rx was discontinued on 10/2016 by provider.

## 2017-08-07 NOTE — Telephone Encounter (Signed)
From the chart I see his cardiologist definitely wants him to take this, and he gave him a year's worth of refills in May

## 2017-08-09 NOTE — Telephone Encounter (Signed)
Called pt pharmacy they stated that the pt does NOT have any refills. Last refilled by Dr. Sarajane Jews and NOT pt's heart doctor. Rx sent

## 2017-09-17 ENCOUNTER — Other Ambulatory Visit: Payer: Self-pay | Admitting: Cardiology

## 2017-09-17 MED ORDER — LISINOPRIL 30 MG PO TABS: 30.0000 mg | ORAL_TABLET | Freq: Every day | ORAL | 0 refills | Status: DC

## 2017-10-12 ENCOUNTER — Telehealth: Payer: Self-pay | Admitting: Family Medicine

## 2017-10-12 NOTE — Telephone Encounter (Signed)
Sent to PCP ?

## 2017-10-12 NOTE — Telephone Encounter (Signed)
Copied from [place] [id number]. Topic: Quick Communication - See Telephone Encounter >> Oct 12, 2017  3:12 PM Percell Belt A wrote: CRM for notification. See Telephone encounter for: Lee Ballard called in and said that pt would like Dr Sarajane Jews to take over prescribing lisinopril (PRINIVIL,ZESTRIL) 30 MG tablet [[id number].  Could Dr Sarajane Jews do that.  Pt need refill   [facility] [place] [facility] [address]   10/12/17.

## 2017-10-15 MED ORDER — LISINOPRIL 30 MG PO TABS: 30.0000 mg | ORAL_TABLET | Freq: Every day | ORAL | 3 refills | Status: DC

## 2017-10-15 NOTE — Telephone Encounter (Signed)
Rx has been sent called pt and left a VM that Dr. Sarajane Jews was fine with taking over Rx and we [facility]

## 2017-10-15 NOTE — Telephone Encounter (Signed)
[name]. Call in #[phone] with 3 rf

## 2017-11-02 ENCOUNTER — Other Ambulatory Visit: Payer: Self-pay | Admitting: *Deleted

## 2017-11-02 MED ORDER — SIMVASTATIN 40 MG PO TABS: 40.0000 mg | ORAL_TABLET | Freq: Every day | ORAL | 1 refills | Status: DC

## 2017-11-02 NOTE — Telephone Encounter (Signed)
Rx done. 

## 2018-02-07 ENCOUNTER — Other Ambulatory Visit: Payer: Self-pay | Admitting: Family Medicine

## 2018-02-07 NOTE — Telephone Encounter (Signed)
Patient called, left VM to return call to the office to schedule a physical in order to continue to receive medication, advised it is practice to be seen yearly for a physical. Requesting refills to a new mail order pharmacy.  Simvastatin and Omeprazole refills Last OV:09/06/16 CPE; no upcoming appointments Last refill:11/02/17 90 tab/1 refill Simvastatin; Omeprazole 11/01/16 (expired) PCP:[facility]: [place], [place] [phone] (Phone) [phone] (Fax)

## 2018-02-07 NOTE — Telephone Encounter (Signed)
Copied from [place] [id number]. Topic: Quick Communication - Rx Refill/Question >> Feb 07, 2018 11:12 AM Aurelio Brash B wrote: Medication: simvastatin (ZOCOR) 40 MG tablet  omeprazole (PRILOSEC) 40 MG capsule Has the patient contacted their pharmacy? yes (Agent: If no, request that the patient contact the pharmacy for the refill.) (Agent: If yes, when and what did the pharmacy advise?)  Preferred Pharmacy (with phone number or street name): [place], [place] [phone] (Phone) [phone] (Fax)      Agent: Please be advised that RX refills may take up to 3 business days. We ask that you follow-up with your pharmacy.

## 2018-02-12 ENCOUNTER — Telehealth: Payer: Self-pay

## 2018-02-12 NOTE — Telephone Encounter (Signed)
I see he is scheduled for a physical with is on June 14th. Call in #30 of both of these to his local pharmacy with no rf

## 2018-02-12 NOTE — Telephone Encounter (Signed)
Fax from [facility]   Refill request for the following   * ompreazole 40 MG capsule  * simvastatin 40 MG tab  Last OV 09/06/2016   omprazole last refilled 11/01/2016 disp 180 with 3 refills   Simvastatin last refilled 11/02/2017 disp 90 with 1 refill   Sent to PCP to advise pt is due for an OV/ wellness visit

## 2018-02-13 NOTE — Telephone Encounter (Signed)
Called pt and left a VM to call back need to verify pharmacy.  CRM created sent to [facility] pool.

## 2018-02-13 NOTE — Telephone Encounter (Signed)
Called pt to advise and need to verify pt's preferred pharmacy.

## 2018-02-14 MED ORDER — SIMVASTATIN 40 MG PO TABS: 40.0000 mg | ORAL_TABLET | Freq: Every day | ORAL | 0 refills | Status: DC

## 2018-02-14 NOTE — Telephone Encounter (Signed)
Called and spoke with pt. Pt stated that he does NOT need his omeprazole refilled at this time but it is OK to [facility] [place].   Rx has been sent and pt advised.

## 2018-02-15 ENCOUNTER — Encounter: Payer: Self-pay | Admitting: Family Medicine

## 2018-02-15 ENCOUNTER — Ambulatory Visit (INDEPENDENT_AMBULATORY_CARE_PROVIDER_SITE_OTHER): Payer: Managed Care, Other (non HMO) | Admitting: Family Medicine

## 2018-02-15 VITALS — BP 140/88 | HR 74 | Temp 98.3°F | Ht 71.5 in | Wt 276.0 lb

## 2018-02-15 DIAGNOSIS — R739 Hyperglycemia, unspecified: Secondary | ICD-10-CM

## 2018-02-15 DIAGNOSIS — Z0000 Encounter for general adult medical examination without abnormal findings: Secondary | ICD-10-CM

## 2018-02-15 LAB — BASIC METABOLIC PANEL
BUN: 15 mg/dL (ref 6–23)
CHLORIDE: 100 meq/L (ref 96–112)
CO2: 28 meq/L (ref 19–32)
CREATININE: 1 mg/dL (ref 0.40–1.50)
Calcium: 9.8 mg/dL (ref 8.4–10.5)
GFR: 82.85 mL/min (ref >60.00)
Glucose, Bld: 113 mg/dL — ABNORMAL HIGH (ref 70–99)
POTASSIUM: 4.3 meq/L (ref 3.5–5.1)
SODIUM: 137 meq/L (ref 135–145)

## 2018-02-15 LAB — CBC WITH DIFFERENTIAL/PLATELET
BASOS ABS: 0.1 10*3/uL (ref 0.0–0.1)
Basophils Relative: 0.6 % (ref 0.0–3.0)
EOS PCT: 1.7 % (ref 0.0–5.0)
Eosinophils Absolute: 0.2 10*3/uL (ref 0.0–0.7)
HCT: 46.3 % (ref 39.0–52.0)
Hemoglobin: 15.7 g/dL (ref 13.0–17.0)
Lymphocytes Relative: 24.5 % (ref 12.0–46.0)
Lymphs Abs: 2.3 10*3/uL (ref 0.7–4.0)
MCHC: 34 g/dL (ref 30.0–36.0)
MCV: 90.4 fl (ref 78.0–100.0)
MONOS PCT: 7.7 % (ref 3.0–12.0)
Monocytes Absolute: 0.7 10*3/uL (ref 0.1–1.0)
NEUTROS ABS: 6 10*3/uL (ref 1.4–7.7)
Neutrophils Relative %: 65.5 % (ref 43.0–77.0)
Platelets: 294 10*3/uL (ref 150.0–400.0)
RBC: 5.12 Mil/uL (ref 4.22–5.81)
RDW: 14.3 % (ref 11.5–15.5)
WBC: 9.2 10*3/uL (ref 4.0–10.5)

## 2018-02-15 LAB — POC URINALSYSI DIPSTICK (AUTOMATED)
BILIRUBIN UA: NEGATIVE
Blood, UA: NEGATIVE
Glucose, UA: NEGATIVE
Ketones, UA: NEGATIVE
LEUKOCYTES UA: NEGATIVE
NITRITE UA: NEGATIVE
PH UA: 7 (ref 5.0–8.0)
Protein, UA: NEGATIVE
Spec Grav, UA: 1.015 (ref 1.010–1.025)
Urobilinogen, UA: 0.2 E.U./dL

## 2018-02-15 LAB — HEPATIC FUNCTION PANEL
ALT: 37 U/L (ref 0–53)
AST: 27 U/L (ref 0–37)
Albumin: 4.5 g/dL (ref 3.5–5.2)
Alkaline Phosphatase: 115 U/L (ref 39–117)
BILIRUBIN DIRECT: 0.1 mg/dL (ref 0.0–0.3)
BILIRUBIN TOTAL: 0.6 mg/dL (ref 0.2–1.2)
Total Protein: 7.1 g/dL (ref 6.0–8.3)

## 2018-02-15 LAB — LDL CHOLESTEROL, DIRECT: Direct LDL: 98 mg/dL

## 2018-02-15 LAB — LIPID PANEL
CHOL/HDL RATIO: 4
Cholesterol: 162 mg/dL (ref 0–200)
HDL: 38.6 mg/dL — ABNORMAL LOW (ref >39.00)
NONHDL: 123.56
TRIGLYCERIDES: 244 mg/dL — AB (ref 0.0–149.0)
VLDL: 48.8 mg/dL — ABNORMAL HIGH (ref 0.0–40.0)

## 2018-02-15 LAB — TSH: TSH: 2.64 u[IU]/mL (ref 0.35–4.50)

## 2018-02-15 LAB — PSA: PSA: 0.66 ng/mL (ref 0.10–4.00)

## 2018-02-15 LAB — HEMOGLOBIN A1C: Hgb A1c MFr Bld: 6.1 % (ref 4.6–6.5)

## 2018-02-15 MED ORDER — LISINOPRIL 30 MG PO TABS: 30.0000 mg | ORAL_TABLET | Freq: Every day | ORAL | 3 refills | Status: DC

## 2018-02-15 MED ORDER — VARDENAFIL HCL 20 MG PO TABS: 20.0000 mg | ORAL_TABLET | ORAL | 11 refills | Status: DC | PRN

## 2018-02-15 MED ORDER — OMEPRAZOLE 40 MG PO CPDR: 40.0000 mg | DELAYED_RELEASE_CAPSULE | Freq: Two times a day (BID) | ORAL | 3 refills | Status: DC

## 2018-02-15 MED ORDER — SIMVASTATIN 40 MG PO TABS: 40.0000 mg | ORAL_TABLET | Freq: Every day | ORAL | 3 refills | Status: DC

## 2018-02-15 NOTE — Progress Notes (Signed)
   Subjective:    Patient ID: Lee Ballard, male    DOB: 1965/09/28, 52 y.o.   MRN: [id number]  HPI Here for a well exam. He feels well. His leg edema is stable. He wears compression stockings every day.    Review of Systems  Constitutional: Negative.   HENT: Negative.   Eyes: Negative.   Respiratory: Negative.   Cardiovascular: Negative.   Gastrointestinal: Negative.   Genitourinary: Negative.   Musculoskeletal: Negative.   Skin: Negative.   Neurological: Negative.   Psychiatric/Behavioral: Negative.        Objective:   Physical Exam  Musculoskeletal: He exhibits edema.  2+ edema in both lower legs           Assessment & Plan:  Well exam. We discussed diet and exercise. Get fasting labs.  Alysia Penna, MD

## 2019-01-14 ENCOUNTER — Encounter: Payer: Self-pay | Admitting: Family Medicine

## 2019-01-14 ENCOUNTER — Other Ambulatory Visit: Payer: Self-pay

## 2019-01-14 ENCOUNTER — Ambulatory Visit (INDEPENDENT_AMBULATORY_CARE_PROVIDER_SITE_OTHER): Payer: Managed Care, Other (non HMO) | Admitting: Family Medicine

## 2019-01-14 DIAGNOSIS — M79604 Pain in right leg: Secondary | ICD-10-CM | POA: Diagnosis not present

## 2019-01-14 DIAGNOSIS — I8390 Asymptomatic varicose veins of unspecified lower extremity: Secondary | ICD-10-CM | POA: Diagnosis not present

## 2019-01-14 MED ORDER — CEPHALEXIN 500 MG PO CAPS: 500.0000 mg | ORAL_CAPSULE | Freq: Three times a day (TID) | ORAL | 0 refills | Status: AC

## 2019-01-14 NOTE — Progress Notes (Signed)
Subjective:    Patient ID: Lee Ballard, male    DOB: Nov 28, 1965, 53 y.o.   MRN: [id number]  HPI Virtual Visit via Video Note  I connected with the patient on 01/14/19 at  3:45 PM EDT by a video enabled telemedicine application and verified that I am speaking with the correct person using two identifiers.  Location patient: home Location provider:work or home office Persons participating in the virtual visit: patient, provider  I discussed the limitations of evaluation and management by telemedicine and the availability of in person appointments. The patient expressed understanding and agreed to proceed.   HPI: He has a hx of varicose leg veins, and he has had several vein ablations to the right lower leg in the past. He has chronic edema in the right lower leg and foot and he usually wears a compression stocking. Now for the last 4 days the right lower leg and ankle have been more swollen than usual, and it has been mildly painful and red and warm to the touch. No recent trauma. No fever. No chest pains or SOB. No hx of blood clots.    ROS: See pertinent positives and negatives per HPI.  Past Medical History:  Diagnosis Date  . Edema leg   . Erectile dysfunction   . Family history of premature CAD 09/28/2016  . GERD (gastroesophageal reflux disease)   . Hyperlipidemia   . Hypertension   . Varicose vein of leg     Past Surgical History:  Procedure Laterality Date  . COLONOSCOPY  04/29/2015   per Dr. Henrene Pastor, hyperplastic polyps, repeat in 10 yrs   . ENDOVENOUS ABLATION SAPHENOUS VEIN W/ LASER Right   . TONSILLECTOMY      Family History  Problem Relation Age of Onset  . Cancer Mother   . Heart attack Father 41  . Colon cancer Neg Hx      Current Outpatient Medications:  .  aspirin EC 81 MG tablet, Take 1 tablet (81 mg total) by mouth daily., Disp: 90 tablet, Rfl: 3 .  Calcium 200 MG TABS, Take 400 mg by mouth daily., Disp: , Rfl:  .  ibuprofen (ADVIL,MOTRIN) 200 MG  tablet, Take 200 mg by mouth every 6 (six) hours as needed., Disp: , Rfl:  .  lisinopril (PRINIVIL,ZESTRIL) 30 MG tablet, Take 1 tablet (30 mg total) by mouth daily. Please make yearly appt with Dr. Radford Pax for January before anymore refills. 1st attempt, Disp: 90 tablet, Rfl: 3 .  Magnesium 400 MG CAPS, Take by mouth., Disp: , Rfl:  .  Multiple Vitamins-Minerals (MULTIVITAMIN,TX-MINERALS) tablet, Take 1 tablet by mouth daily.  , Disp: , Rfl:  .  Omega-3 Fatty Acids (FISH OIL) 1200 MG CAPS, Take 1,200 mg by mouth., Disp: , Rfl:  .  omeprazole (PRILOSEC) 40 MG capsule, Take 1 capsule (40 mg total) by mouth 2 (two) times daily., Disp: 180 capsule, Rfl: 3 .  POTASSIUM PO, Take 400 mg by mouth daily., Disp: , Rfl:  .  simvastatin (ZOCOR) 40 MG tablet, Take 1 tablet (40 mg total) by mouth at bedtime., Disp: 90 tablet, Rfl: 3 .  vardenafil (LEVITRA) 20 MG tablet, Take 1 tablet (20 mg total) by mouth as needed for erectile dysfunction., Disp: 3 tablet, Rfl: 11 .  cephALEXin (KEFLEX) 500 MG capsule, Take 1 capsule (500 mg total) by mouth 3 (three) times daily for 10 days., Disp: 30 capsule, Rfl: 0  EXAM:  VITALS per patient if applicable:  GENERAL: alert, oriented, appears  well and in no acute distress  HEENT: atraumatic, conjunttiva clear, no obvious abnormalities on inspection of external nose and ears  NECK: normal movements of the head and neck  LUNGS: on inspection no signs of respiratory distress, breathing rate appears normal, no obvious gross SOB, gasping or wheezing  CV: no obvious cyanosis  MS: moves all visible extremities without noticeable abnormality  PSYCH/NEURO: pleasant and cooperative, no obvious depression or anxiety, speech and thought processing grossly intact  ASSESSMENT AND PLAN: This sounds like either some cellulitis or an acute DVT. I advised him to stay off the leg and to keep it elevated. We will set up a venous doppler ASAP to rule out a DVT. Treat with Keflex.   Alysia Penna, MD  Discussed the following assessment and plan:  Pain of right lower extremity - Plan: VAS [place] LOWER EXTREMITY VENOUS (DVT)  VARICOSE VEINS, LOWER EXTREMITIES     I discussed the assessment and treatment plan with the patient. The patient was provided an opportunity to ask questions and all were answered. The patient agreed with the plan and demonstrated an understanding of the instructions.   The patient was advised to call back or seek an in-person evaluation if the symptoms worsen or if the condition fails to improve as anticipated.     Review of Systems     Objective:   Physical Exam        Assessment & Plan:

## 2019-01-15 ENCOUNTER — Other Ambulatory Visit: Payer: Self-pay | Admitting: Internal Medicine

## 2019-01-15 ENCOUNTER — Ambulatory Visit (HOSPITAL_COMMUNITY)
Admission: RE | Admit: 2019-01-15 | Discharge: 2019-01-15 | Disposition: A | Discharge status: Home / Self Care | Payer: Managed Care, Other (non HMO) | Source: Ambulatory Visit | Attending: Family Medicine | Admitting: Family Medicine

## 2019-01-15 ENCOUNTER — Telehealth: Payer: Self-pay | Admitting: Family Medicine

## 2019-01-15 ENCOUNTER — Encounter (HOSPITAL_COMMUNITY): Payer: Self-pay

## 2019-01-15 ENCOUNTER — Other Ambulatory Visit: Payer: Self-pay

## 2019-01-15 DIAGNOSIS — M79604 Pain in right leg: Secondary | ICD-10-CM | POA: Diagnosis not present

## 2019-01-15 DIAGNOSIS — I809 Phlebitis and thrombophlebitis of unspecified site: Secondary | ICD-10-CM | POA: Insufficient documentation

## 2019-01-15 DIAGNOSIS — I8001 Phlebitis and thrombophlebitis of superficial vessels of right lower extremity: Secondary | ICD-10-CM

## 2019-01-15 MED ORDER — RIVAROXABAN 10 MG PO TABS: 10.0000 mg | ORAL_TABLET | Freq: Every day | ORAL | 1 refills | Status: DC

## 2019-01-15 NOTE — Progress Notes (Signed)
Right lower extremity venous duplex completed. Preliminary report called to triage nurse who is contacting on-call Physician. Preliminary report is in Epic. No evidence of DVT, however there is acute superficial thrombophlebitis.  Final report to follow.

## 2019-01-15 NOTE — Telephone Encounter (Signed)
Call from vascular lab with results of venous duplex. Results: R/O DVT R. Leg, Abundance of superficial thrombophlebitis mainly in the small saphenous vein that are within 1cm where it connects to the popliteal vein.Also, thrombosed varicosities in the lower Right calf. Calling report to on call physician, Dr. Derrel Nip. Left VM of report.

## 2019-01-15 NOTE — Progress Notes (Signed)
Patient notified..  Given multiple superficial clots and proximity to popliteal junction,  Advised to have anticoagulation for 45 days and repeat US to be ordered by PCP dr Sarajane Jews.  [name]  [facility] [address]

## 2019-01-15 NOTE — Assessment & Plan Note (Signed)
Small saphenous vein ,  Right leg,  And multiple thrombi noted in varicosities.  xarelto recommended. Patient contacted

## 2019-01-16 NOTE — Telephone Encounter (Signed)
Dr. Derrel Nip took care of this. No need to call the patient.

## 2019-01-16 NOTE — Telephone Encounter (Signed)
Dr. Sarajane Jews please advise on results of doppler.  Thanks

## 2019-01-17 ENCOUNTER — Encounter: Payer: Self-pay | Admitting: *Deleted

## 2019-01-17 ENCOUNTER — Telehealth: Payer: Self-pay | Admitting: *Deleted

## 2019-01-17 NOTE — Telephone Encounter (Signed)
Copied from [place] [id number]. Topic: General - Other >> Jan 17, 2019  9:11 AM Burchel, Abbi R wrote: Reason for CRM:   Pt requesting a work release note be faxed to:  [employer] Attn:HR [phone]

## 2019-01-17 NOTE — Telephone Encounter (Signed)
Called and spoke with the pt and he stated that he did return to work this morning.    He will need a note to be done and sent to HR for him.  Thanks

## 2019-01-17 NOTE — Telephone Encounter (Signed)
Does this mean he wants to go back to work? And if so, when?

## 2019-01-17 NOTE — Telephone Encounter (Signed)
Dr. Fry please advise. Thanks  

## 2019-01-27 NOTE — Telephone Encounter (Signed)
Please make him a return to work note

## 2019-01-29 NOTE — Telephone Encounter (Signed)
Letter for work has been completed and faxed.

## 2019-03-05 ENCOUNTER — Other Ambulatory Visit: Payer: Self-pay | Admitting: Internal Medicine

## 2019-05-06 ENCOUNTER — Other Ambulatory Visit: Payer: Self-pay | Admitting: Family Medicine

## 2019-05-07 ENCOUNTER — Other Ambulatory Visit: Payer: Self-pay | Admitting: Family Medicine

## 2019-07-24 ENCOUNTER — Other Ambulatory Visit: Payer: Self-pay

## 2019-07-25 ENCOUNTER — Ambulatory Visit (INDEPENDENT_AMBULATORY_CARE_PROVIDER_SITE_OTHER): Payer: Managed Care, Other (non HMO) | Admitting: Family Medicine

## 2019-07-25 ENCOUNTER — Telehealth: Payer: Self-pay

## 2019-07-25 ENCOUNTER — Other Ambulatory Visit: Payer: Self-pay

## 2019-07-25 ENCOUNTER — Encounter: Payer: Self-pay | Admitting: Family Medicine

## 2019-07-25 VITALS — BP 120/88 | HR 73 | Temp 98.0°F | Ht 71.5 in | Wt 264.0 lb

## 2019-07-25 DIAGNOSIS — R739 Hyperglycemia, unspecified: Secondary | ICD-10-CM

## 2019-07-25 DIAGNOSIS — Z125 Encounter for screening for malignant neoplasm of prostate: Secondary | ICD-10-CM | POA: Diagnosis not present

## 2019-07-25 DIAGNOSIS — Z0000 Encounter for general adult medical examination without abnormal findings: Secondary | ICD-10-CM

## 2019-07-25 DIAGNOSIS — Z23 Encounter for immunization: Secondary | ICD-10-CM

## 2019-07-25 LAB — LIPID PANEL
Cholesterol: 155 mg/dL (ref 0–200)
HDL: 31.9 mg/dL — ABNORMAL LOW (ref >39.00)
NonHDL: 123.32
Total CHOL/HDL Ratio: 5
Triglycerides: 244 mg/dL — ABNORMAL HIGH (ref 0.0–149.0)
VLDL: 48.8 mg/dL — ABNORMAL HIGH (ref 0.0–40.0)

## 2019-07-25 LAB — CBC WITH DIFFERENTIAL/PLATELET
Basophils Absolute: 0.1 10*3/uL (ref 0.0–0.1)
Basophils Relative: 0.6 % (ref 0.0–3.0)
Eosinophils Absolute: 0.2 10*3/uL (ref 0.0–0.7)
Eosinophils Relative: 1.8 % (ref 0.0–5.0)
HCT: 47.7 % (ref 39.0–52.0)
Hemoglobin: 16.1 g/dL (ref 13.0–17.0)
Lymphocytes Relative: 24 % (ref 12.0–46.0)
Lymphs Abs: 2.4 10*3/uL (ref 0.7–4.0)
MCHC: 33.7 g/dL (ref 30.0–36.0)
MCV: 91.7 fl (ref 78.0–100.0)
Monocytes Absolute: 0.7 10*3/uL (ref 0.1–1.0)
Monocytes Relative: 7.4 % (ref 3.0–12.0)
Neutro Abs: 6.5 10*3/uL (ref 1.4–7.7)
Neutrophils Relative %: 66.2 % (ref 43.0–77.0)
Platelets: 322 10*3/uL (ref 150.0–400.0)
RBC: 5.2 Mil/uL (ref 4.22–5.81)
RDW: 14.3 % (ref 11.5–15.5)
WBC: 9.9 10*3/uL (ref 4.0–10.5)

## 2019-07-25 LAB — TSH: TSH: 2.55 u[IU]/mL (ref 0.35–4.50)

## 2019-07-25 LAB — HEMOGLOBIN A1C: Hgb A1c MFr Bld: 6 % (ref 4.6–6.5)

## 2019-07-25 LAB — HEPATIC FUNCTION PANEL
ALT: 29 U/L (ref 0–53)
AST: 22 U/L (ref 0–37)
Albumin: 4.5 g/dL (ref 3.5–5.2)
Alkaline Phosphatase: 109 U/L (ref 39–117)
Bilirubin, Direct: 0.1 mg/dL (ref 0.0–0.3)
Total Bilirubin: 0.7 mg/dL (ref 0.2–1.2)
Total Protein: 7 g/dL (ref 6.0–8.3)

## 2019-07-25 LAB — BASIC METABOLIC PANEL
BUN: 18 mg/dL (ref 6–23)
CO2: 27 mEq/L (ref 19–32)
Calcium: 9.9 mg/dL (ref 8.4–10.5)
Chloride: 102 mEq/L (ref 96–112)
Creatinine, Ser: 1.07 mg/dL (ref 0.40–1.50)
GFR: 71.71 mL/min (ref >60.00)
Glucose, Bld: 113 mg/dL — ABNORMAL HIGH (ref 70–99)
Potassium: 4.3 mEq/L (ref 3.5–5.1)
Sodium: 137 mEq/L (ref 135–145)

## 2019-07-25 LAB — LDL CHOLESTEROL, DIRECT: Direct LDL: 97 mg/dL

## 2019-07-25 LAB — PSA: PSA: 0.69 ng/mL (ref 0.10–4.00)

## 2019-07-25 MED ORDER — OMEPRAZOLE 40 MG PO CPDR: 40.0000 mg | DELAYED_RELEASE_CAPSULE | Freq: Every day | ORAL | 3 refills | Status: DC

## 2019-07-25 MED ORDER — OMEPRAZOLE 40 MG PO CPDR: 40.0000 mg | DELAYED_RELEASE_CAPSULE | Freq: Two times a day (BID) | ORAL | 3 refills | Status: DC

## 2019-07-25 NOTE — Progress Notes (Signed)
   Subjective:    Patient ID: Lee Ballard, male    DOB: [birth year], 53 y.o.   MRN: [id number]  HPI Here for a well exam. He feels great.    Review of Systems  Constitutional: Negative.   HENT: Negative.   Eyes: Negative.   Respiratory: Negative.   Cardiovascular: Negative.   Gastrointestinal: Negative.   Genitourinary: Negative.   Musculoskeletal: Negative.   Skin: Negative.   Neurological: Negative.   Psychiatric/Behavioral: Negative.        Objective:   Physical Exam Constitutional:      General: He is not in acute distress.    Appearance: He is well-developed. He is not diaphoretic.  HENT:     Head: Normocephalic and atraumatic.     Right Ear: External ear normal.     Left Ear: External ear normal.     Nose: Nose normal.     Mouth/Throat:     Pharynx: No oropharyngeal exudate.  Eyes:     General: No scleral icterus.       Right eye: No discharge.        Left eye: No discharge.     Conjunctiva/sclera: Conjunctivae normal.     Pupils: Pupils are equal, round, and reactive to light.  Neck:     Musculoskeletal: Neck supple.     Thyroid: No thyromegaly.     Vascular: No JVD.     Trachea: No tracheal deviation.  Cardiovascular:     Rate and Rhythm: Normal rate and regular rhythm.     Heart sounds: Normal heart sounds. No murmur. No friction rub. No gallop.   Pulmonary:     Effort: Pulmonary effort is normal. No respiratory distress.     Breath sounds: Normal breath sounds. No wheezing or rales.  Chest:     Chest wall: No tenderness.  Abdominal:     General: Bowel sounds are normal. There is no distension.     Palpations: Abdomen is soft. There is no mass.     Tenderness: There is no abdominal tenderness. There is no guarding or rebound.  Genitourinary:    Penis: Normal. No tenderness.      Scrotum/Testes: Normal.     Prostate: Normal.     Rectum: Normal. Guaiac result negative.  Musculoskeletal: Normal range of motion.        General: No tenderness.   Lymphadenopathy:     Cervical: No cervical adenopathy.  Skin:    General: Skin is warm and dry.     Coloration: Skin is not pale.     Findings: No erythema or rash.  Neurological:     Mental Status: He is alert and oriented to person, place, and time.     Cranial Nerves: No cranial nerve deficit.     Motor: No abnormal muscle tone.     Coordination: Coordination normal.     Deep Tendon Reflexes: Reflexes are normal and symmetric. Reflexes normal.  Psychiatric:        Behavior: Behavior normal.        Thought Content: Thought content normal.        Judgment: Judgment normal.           Assessment & Plan:  Well exam. We discussed diet and exercise. Get fasting labs.  Alysia Penna, MD

## 2019-07-25 NOTE — Patient Instructions (Signed)
Health Maintenance Due  Topic Date Due  . Hepatitis C Screening  [birth year]  . TETANUS/TDAP  09/04/2017  . INFLUENZA VACCINE  04/05/2019    Depression screen PHQ 2/9 02/15/2018  Decreased Interest 0  Down, Depressed, Hopeless 0  PHQ - 2 Score 0

## 2019-07-25 NOTE — Telephone Encounter (Signed)
[facility] that [facility] omeprazole sig need to say take one time daily for pt to get approved. [facility] has [facility]

## 2019-08-04 ENCOUNTER — Encounter: Payer: Self-pay | Admitting: Family Medicine

## 2019-08-04 DIAGNOSIS — H9190 Unspecified hearing loss, unspecified ear: Secondary | ICD-10-CM

## 2019-08-06 NOTE — Telephone Encounter (Signed)
Ok for referral?

## 2019-08-07 NOTE — Telephone Encounter (Signed)
The referral was done  

## 2019-08-23 ENCOUNTER — Other Ambulatory Visit: Payer: Self-pay | Admitting: Family Medicine

## 2019-10-16 ENCOUNTER — Ambulatory Visit: Payer: Managed Care, Other (non HMO) | Attending: Family Medicine | Admitting: Audiology

## 2019-10-16 ENCOUNTER — Other Ambulatory Visit: Payer: Self-pay

## 2019-10-16 DIAGNOSIS — H93299 Other abnormal auditory perceptions, unspecified ear: Secondary | ICD-10-CM | POA: Diagnosis present

## 2019-10-16 DIAGNOSIS — H90A32 Mixed conductive and sensorineural hearing loss, unilateral, left ear with restricted hearing on the contralateral side: Secondary | ICD-10-CM | POA: Insufficient documentation

## 2019-10-16 DIAGNOSIS — H90A21 Sensorineural hearing loss, unilateral, right ear, with restricted hearing on the contralateral side: Secondary | ICD-10-CM | POA: Insufficient documentation

## 2019-10-16 NOTE — Procedures (Signed)
[facility]  [place], [place] [address]  [phone]   Audiological Evaluation  Patient Name: Lee Ballard   Status: Outpatient   DOB: 1965/12/22    Diagnosis: Hearing Loss                 MRN: [id number] Date:  10/16/2019     Referent: Laurey Morale, MD  History: [name] was seen for an audiological evaluation. Primary Concern:  "Hearing loss with a more rapid decline over the past 5 years gotten worse". Having difficulty hearing in groups.  Better hearing in the left ear".  History of ear infections:  N History of ear surgery or "tubes" : N History of dizziness/vertigo:   N History of balance issues:  N Tinnitus: Y - "crickets constantly - worse the past 2 years".  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 History of hypertension: Y - for 30 years. History of diabetes:   N Family history of hearing loss: N Pain: None   Evaluation: Conventional pure tone audiometry from 250Hz  - 8000Hz  with using insert earphones.  Hearing Thresholds fairly symmetrical but poorer on the right side. Hearing thresholds at 250Hz  20-30dBHL; 20 dBHL at 500Hz ; 25-35 dBHL at 1000Hz ; 45-60 dBHL at 2000Hz ; 60 dBHL at 3000Hz -4000Hz  and 45-65 dBHL at 8000Hz : Right ear:  Sensorineural hearing loss  Left ear:    Mixed hearing loss - conductive component in the low frequencies and sensorineural in the high frequencies.  Reliability is good Speech reception levels (repeating words near threshold) using recorded spondee word lists:  Right ear: 30 dBHL.  Left ear:  15 dBHL Word recognition (at most comfortable listening levels) using recorded NU-6 word lists, in quiet.  Right ear: 72% at 65 dBHL.  Left ear:   80% at 55 dBHL. Word recognition in minimal background noise using [place] word lists in recorded multitalker noise:  +5  dBHL  Right ear: 35%                              Left ear:  30%  Tympanometry (middle ear function) with ipsilateral/contralateral acoustic reflexes fairly symmetrical at 90-100dB at 500Hz ; 95-100dB at 1000Hz ; 100-105dB at 2000Hz  and no response at 4000Hz ..  Right ear: Normal (Type A) but with large middle ear volume.  Left ear: Normal (Type Ad) with excessive tympanic membrane compliance.    Tinnitus matching appeared to be approximately 250-500Hz  using narrow band noise at 55 dBHL.  The tinnitus does not appear to suppress.  CONCLUSION:      Jebadiah Boyden has normal low frequency hearing with a moderate to moderately severe mid and high frequency hearing loss bilaterally. The hearing loss is mixed on the left and is sensorineural on the right side.  This amount of hearing loss will adversely affect speech communication at normal conversational speech levels in quiet and especially in background noise.   Word recognition is fair in the right ear and good in the left ear in quiet at conversational speech levels bilaterally. In minimal background noise, word recognition drops to extremely poor in each ear. .      The tinnitus appears low frequencies but is equivalent to conversational voice levels and is "constant".  Waris Mchaffie has already discovered the importance of masking the tinnitus and trying to not focus on  it.  Continued use of these techniques was recommended.    As discussed because of the conductive hearing loss component on the left side and poorer than expected word recognition for his age with tinnitus, further evaluation by an ENT is recommended prior to a hearing aid evaluation.  However,  Masson Remillard may be a candidate for tinnitus therapies and/or benefit from amplification; therefore a hearing aid evaluation is recommended. He was encouraged to investigate hearing aid insurance benefits, although he may also contact [facility] [employer] as a source of hearing aid payment  if needed.  The test results were discussed and Yadriel Gregori counseled.   RECOMMENDATIONS: 1.   Referral to an [facility] physician for a) left low frequency conductive component b) tinnitus c) poorer than expected word recognition for his age. 2.   A hearing aid evaluation.  3.  To minimize the adverse effects of tinnitus 1) avoid quiet  2) use noise maskers at home such as a sound machine, quiet music, a fan or other background noise at a volume just loud enough to mask the high pitched tinnitus. 3) If the tinnitus becomes more bothersome, adversely affecting your sleep or concentration, contact your physician, seek additional medical help by an ENT for further treatment of your tinnitus.  4) Explore the free app STARKEY RELAX for tinnitus masking. 4.  Strategies that help improve hearing include: A) Face the speaker directly. Optimal is having the speakers face well - lit.  Unless amplified, being within 3-6 feet of the speaker will enhance word recognition. B) Avoid having the speaker back-lit as this will minimize the ability to use cues from lip-reading, facial expression and gestures. C)  Word recognition is poorer in background noise. For optimal word recognition, turn off the TV, radio or noisy fan when engaging in conversation. In a restaurant, try to sit away from noise sources and close to the primary speaker.  D)  Ask for topic clarification from time to time in order to remain in the conversation.  Most people don't mind repeating or clarifying a point when asked.  If needed, explain the difficulty hearing in background noise or hearing loss. 5.   Use hearing protection during noisy activities such as using a weed eater, moving the lawn, shooting, etc.    Musician's plugs, are available from [employer] for music related hearing protection because there is no distortion.  Other hearing protection, such as sponge plugs (available at pharmacies) or earmuffs (available at sporting  goods stores or department stores such as Paediatric nurse) are useful for noisy activities and venues.  Tonda Wiederhold L. Heide Spark Au.D., CCC-A Doctor of Audiology  10/16/2019  cc: Laurey Morale, MD

## 2019-10-22 ENCOUNTER — Telehealth: Payer: Self-pay | Admitting: Family Medicine

## 2019-10-22 MED ORDER — OMEPRAZOLE 40 MG PO CPDR: 40.0000 mg | DELAYED_RELEASE_CAPSULE | Freq: Every day | ORAL | 0 refills | Status: DC

## 2019-10-22 NOTE — Telephone Encounter (Signed)
Medication Refill: Omeprazole 40 MG Pharmacy: [facility]  Fax: [phone]

## 2019-10-22 NOTE — Telephone Encounter (Signed)
Rx has been sent in. Patient is aware. 

## 2019-10-24 ENCOUNTER — Telehealth: Payer: Self-pay | Admitting: Family Medicine

## 2019-10-24 NOTE — Telephone Encounter (Signed)
This should be twice a day for #180 with 3 rf

## 2019-10-24 NOTE — Telephone Encounter (Signed)
Lee Ballard with [facility] would like clarification on Omeprazole 40 mg. Does pt need to take it once or twice a day? Thanks   Lee Ballard's [phone] option 2

## 2019-10-24 NOTE — Telephone Encounter (Signed)
ATC, waited for 10 minutes and the phone was never answered. Will try back.

## 2019-10-30 NOTE — Telephone Encounter (Signed)
ATC 

## 2019-11-07 ENCOUNTER — Telehealth: Payer: Self-pay | Admitting: Family Medicine

## 2019-11-07 MED ORDER — OMEPRAZOLE 40 MG PO CPDR: 40.0000 mg | DELAYED_RELEASE_CAPSULE | Freq: Every day | ORAL | 0 refills | Status: DC

## 2019-11-07 NOTE — Addendum Note (Signed)
Addended by: Rebecca Eaton on: 11/07/2019 04:00 PM   Modules accepted: Orders

## 2019-11-07 NOTE — Telephone Encounter (Signed)
Rx has been sent in. Patient is aware. 

## 2019-11-07 NOTE — Telephone Encounter (Signed)
Pt call and want his omeprazole (PRILOSEC) 40 MG capsul  Sent to [facility] at [facility].[address] [address].

## 2019-11-10 MED ORDER — OMEPRAZOLE 40 MG PO CPDR: 40.0000 mg | DELAYED_RELEASE_CAPSULE | Freq: Every day | ORAL | 0 refills | Status: DC

## 2019-11-10 NOTE — Telephone Encounter (Signed)
Pt is calling in stating that he had requested that his omeprazole go to [facility] in [place] on [address] can this be sent there today.

## 2019-11-10 NOTE — Telephone Encounter (Signed)
Rx has been sent to the requested pharmacy.  

## 2019-11-15 ENCOUNTER — Ambulatory Visit: Payer: Managed Care, Other (non HMO) | Attending: Internal Medicine

## 2019-11-15 DIAGNOSIS — Z23 Encounter for immunization: Secondary | ICD-10-CM

## 2019-11-15 NOTE — Progress Notes (Signed)
   Covid-19 Vaccination Clinic  Name:  Lee Ballard    MRN: [id number] DOB: 22-May-1966  11/15/2019  Mr. Lee Ballard was observed post Covid-19 immunization for 15 minutes without incident. He was provided with Vaccine Information Sheet and instruction to access the V-Safe system.   Mr. Lee Ballard was instructed to call 911 with any severe reactions post vaccine: Marland Kitchen Difficulty breathing  . Swelling of face and throat  . A fast heartbeat  . A bad rash all over body  . Dizziness and weakness   Immunizations Administered    Name Date Dose VIS Date Route   Pfizer COVID-19 Vaccine 11/15/2019 11:32 AM 0.3 mL 08/15/2019 Intramuscular   Manufacturer: [place]   Lot: [id number]   [place]: [id number]

## 2019-12-08 ENCOUNTER — Ambulatory Visit: Payer: Managed Care, Other (non HMO)

## 2019-12-09 ENCOUNTER — Ambulatory Visit: Payer: Managed Care, Other (non HMO) | Attending: Internal Medicine

## 2019-12-09 DIAGNOSIS — Z23 Encounter for immunization: Secondary | ICD-10-CM

## 2019-12-09 NOTE — Progress Notes (Signed)
   Covid-19 Vaccination Clinic  Name:  Lee Ballard    MRN: [id number] DOB: November 10, 1965  12/09/2019  [name] was observed post Covid-19 immunization for 15 minutes without incident. He was provided with Vaccine Information Sheet and instruction to access the V-Safe system.   [name] was instructed to call 911 with any severe reactions post vaccine: Marland Kitchen Difficulty breathing  . Swelling of face and throat  . A fast heartbeat  . A bad rash all over body  . Dizziness and weakness   Immunizations Administered    Name Date Dose VIS Date Route   Pfizer COVID-19 Vaccine 12/09/2019 10:27 AM 0.3 mL 08/15/2019 Intramuscular   Manufacturer: [employer]   Lot: [id number]   [place]: [id number]

## 2020-01-15 ENCOUNTER — Other Ambulatory Visit: Payer: Self-pay | Admitting: Family Medicine

## 2020-03-17 ENCOUNTER — Other Ambulatory Visit: Payer: Self-pay | Admitting: Family Medicine

## 2020-03-20 ENCOUNTER — Other Ambulatory Visit: Payer: Self-pay | Admitting: Family Medicine

## 2020-04-15 ENCOUNTER — Other Ambulatory Visit: Payer: Self-pay | Admitting: Family Medicine

## 2020-04-16 ENCOUNTER — Other Ambulatory Visit: Payer: Self-pay | Admitting: Family Medicine

## 2020-07-10 ENCOUNTER — Other Ambulatory Visit: Payer: Self-pay | Admitting: Family Medicine

## 2020-08-05 ENCOUNTER — Other Ambulatory Visit: Payer: Self-pay

## 2020-08-05 ENCOUNTER — Encounter: Payer: Self-pay | Admitting: Family Medicine

## 2020-08-05 ENCOUNTER — Ambulatory Visit (INDEPENDENT_AMBULATORY_CARE_PROVIDER_SITE_OTHER): Payer: 59 | Admitting: Family Medicine

## 2020-08-05 VITALS — BP 152/87 | HR 77 | Ht 71.5 in | Wt 281.0 lb

## 2020-08-05 DIAGNOSIS — I10 Essential (primary) hypertension: Secondary | ICD-10-CM | POA: Diagnosis not present

## 2020-08-05 DIAGNOSIS — E785 Hyperlipidemia, unspecified: Secondary | ICD-10-CM

## 2020-08-05 DIAGNOSIS — Z23 Encounter for immunization: Secondary | ICD-10-CM | POA: Diagnosis not present

## 2020-08-05 DIAGNOSIS — H906 Mixed conductive and sensorineural hearing loss, bilateral: Secondary | ICD-10-CM

## 2020-08-05 DIAGNOSIS — R739 Hyperglycemia, unspecified: Secondary | ICD-10-CM

## 2020-08-05 DIAGNOSIS — Z0000 Encounter for general adult medical examination without abnormal findings: Secondary | ICD-10-CM

## 2020-08-05 LAB — CBC WITH DIFFERENTIAL/PLATELET
Basophils Absolute: 0.1 10*3/uL (ref 0.0–0.1)
Basophils Relative: 0.6 % (ref 0.0–3.0)
Eosinophils Absolute: 0.2 10*3/uL (ref 0.0–0.7)
Eosinophils Relative: 1.9 % (ref 0.0–5.0)
HCT: 47.4 % (ref 39.0–52.0)
Hemoglobin: 16 g/dL (ref 13.0–17.0)
Lymphocytes Relative: 27.4 % (ref 12.0–46.0)
Lymphs Abs: 2.4 10*3/uL (ref 0.7–4.0)
MCHC: 33.8 g/dL (ref 30.0–36.0)
MCV: 92.2 fl (ref 78.0–100.0)
Monocytes Absolute: 0.7 10*3/uL (ref 0.1–1.0)
Monocytes Relative: 7.8 % (ref 3.0–12.0)
Neutro Abs: 5.5 10*3/uL (ref 1.4–7.7)
Neutrophils Relative %: 62.3 % (ref 43.0–77.0)
Platelets: 250 10*3/uL (ref 150.0–400.0)
RBC: 5.14 Mil/uL (ref 4.22–5.81)
RDW: 15.2 % (ref 11.5–15.5)
WBC: 8.8 10*3/uL (ref 4.0–10.5)

## 2020-08-05 LAB — BASIC METABOLIC PANEL
BUN: 16 mg/dL (ref 6–23)
CO2: 24 mEq/L (ref 19–32)
Calcium: 9.7 mg/dL (ref 8.4–10.5)
Chloride: 102 mEq/L (ref 96–112)
Creatinine, Ser: 1.08 mg/dL (ref 0.40–1.50)
GFR: 76.89 mL/min (ref >60.00)
Glucose, Bld: 118 mg/dL — ABNORMAL HIGH (ref 70–99)
Potassium: 4.5 mEq/L (ref 3.5–5.1)
Sodium: 135 mEq/L (ref 135–145)

## 2020-08-05 LAB — LIPID PANEL
Cholesterol: 165 mg/dL (ref 0–200)
HDL: 36.6 mg/dL — ABNORMAL LOW (ref >39.00)
NonHDL: 128.04
Total CHOL/HDL Ratio: 4
Triglycerides: 237 mg/dL — ABNORMAL HIGH (ref 0.0–149.0)
VLDL: 47.4 mg/dL — ABNORMAL HIGH (ref 0.0–40.0)

## 2020-08-05 LAB — PSA: PSA: 0.68 ng/mL (ref 0.10–4.00)

## 2020-08-05 LAB — HEPATIC FUNCTION PANEL
ALT: 52 U/L (ref 0–53)
AST: 39 U/L — ABNORMAL HIGH (ref 0–37)
Albumin: 4.4 g/dL (ref 3.5–5.2)
Alkaline Phosphatase: 108 U/L (ref 39–117)
Bilirubin, Direct: 0.1 mg/dL (ref 0.0–0.3)
Total Bilirubin: 0.8 mg/dL (ref 0.2–1.2)
Total Protein: 7.1 g/dL (ref 6.0–8.3)

## 2020-08-05 LAB — HEMOGLOBIN A1C: Hgb A1c MFr Bld: 6.2 % (ref 4.6–6.5)

## 2020-08-05 LAB — LDL CHOLESTEROL, DIRECT: Direct LDL: 105 mg/dL

## 2020-08-05 LAB — TSH: TSH: 2.78 u[IU]/mL (ref 0.35–4.50)

## 2020-08-05 MED ORDER — LISINOPRIL 30 MG PO TABS
ORAL_TABLET | ORAL | 3 refills | Status: DC
Start: 2020-08-05 — End: 2021-06-15

## 2020-08-05 MED ORDER — OMEPRAZOLE 40 MG PO CPDR
40.0000 mg | DELAYED_RELEASE_CAPSULE | Freq: Two times a day (BID) | ORAL | 3 refills | Status: DC
Start: 2020-08-05 — End: 2021-08-30

## 2020-08-05 NOTE — Progress Notes (Signed)
Subjective:    Patient ID: Lee Ballard, male    DOB: October 02, 1965, 54 y.o.   MRN: [id number]  HPI Here for a well exam. He feels fine but he asks about help with losing weight. He has tried on his own with limited success. Also in February he had an audiology exam and he was found to have mixed sensorineural and conductive hearing losses bilaterally. His BP at home has been stable. This morning it was 135/82.    Review of Systems  Constitutional: Negative.   HENT: Positive for hearing loss.   Eyes: Negative.   Respiratory: Negative.   Cardiovascular: Negative.   Gastrointestinal: Negative.   Genitourinary: Negative.   Musculoskeletal: Negative.   Skin: Negative.   Neurological: Negative.   Psychiatric/Behavioral: Negative.        Objective:   Physical Exam Constitutional:      General: He is not in acute distress.    Appearance: He is well-developed. He is obese. He is not diaphoretic.  HENT:     Head: Normocephalic and atraumatic.     Right Ear: External ear normal.     Left Ear: External ear normal.     Nose: Nose normal.     Mouth/Throat:     Pharynx: No oropharyngeal exudate.  Eyes:     General: No scleral icterus.       Right eye: No discharge.        Left eye: No discharge.     Conjunctiva/sclera: Conjunctivae normal.     Pupils: Pupils are equal, round, and reactive to light.  Neck:     Thyroid: No thyromegaly.     Vascular: No JVD.     Trachea: No tracheal deviation.  Cardiovascular:     Rate and Rhythm: Normal rate and regular rhythm.     Heart sounds: Normal heart sounds. No murmur heard.  No friction rub. No gallop.   Pulmonary:     Effort: Pulmonary effort is normal. No respiratory distress.     Breath sounds: Normal breath sounds. No wheezing or rales.  Chest:     Chest wall: No tenderness.  Abdominal:     General: Bowel sounds are normal. There is no distension.     Palpations: Abdomen is soft. There is no mass.     Tenderness: There is no  abdominal tenderness. There is no guarding or rebound.  Genitourinary:    Penis: Normal. No tenderness.      Testes: Normal.     Prostate: Normal.     Rectum: Normal. Guaiac result negative.  Musculoskeletal:        General: No tenderness. Normal range of motion.     Cervical back: Neck supple.  Lymphadenopathy:     Cervical: No cervical adenopathy.  Skin:    General: Skin is warm and dry.     Coloration: Skin is not pale.     Findings: No erythema or rash.  Neurological:     Mental Status: He is alert and oriented to person, place, and time.     Cranial Nerves: No cranial nerve deficit.     Motor: No abnormal muscle tone.     Coordination: Coordination normal.     Deep Tendon Reflexes: Reflexes are normal and symmetric. Reflexes normal.  Psychiatric:        Behavior: Behavior normal.        Thought Content: Thought content normal.        Judgment: Judgment normal.  Assessment & Plan:  Well exam. We discussed diet and exercise. I will refer him to Nutrition to help with the weight. Get fasting labs. Refer to ENT for the hearing loss.  Alysia Penna, MD

## 2020-08-06 ENCOUNTER — Ambulatory Visit: Payer: Managed Care, Other (non HMO) | Admitting: Family Medicine

## 2020-08-20 ENCOUNTER — Other Ambulatory Visit: Payer: Self-pay

## 2020-08-20 ENCOUNTER — Encounter: Payer: 59 | Attending: Family Medicine | Admitting: Registered"

## 2020-08-20 ENCOUNTER — Encounter: Payer: Self-pay | Admitting: Registered"

## 2020-08-20 DIAGNOSIS — R739 Hyperglycemia, unspecified: Secondary | ICD-10-CM | POA: Diagnosis present

## 2020-08-20 DIAGNOSIS — Z713 Dietary counseling and surveillance: Secondary | ICD-10-CM | POA: Insufficient documentation

## 2020-08-20 NOTE — Progress Notes (Signed)
Medical Nutrition Therapy   Primary concerns today: Patient requested appointment and would like to work on weight loss  Referral diagnosis: HTN, HLD, hyperglycemia Preferred learning style: no preference indicated Learning readiness: ready   NUTRITION ASSESSMENT   Anthropometrics  Body Composition Scale Date  Current Body Weight 280.5  Total Body Fat % 33.8%  Visceral Fat 26  Fat-Free Mass % 47.1%   Total Body Water % 47.1%  Muscle-Mass lbs 52.9  BMI 38.1  Body Fat Displacement          Torso  lbs 58.8         Left Leg  lbs 11.7         Right Leg  Lbs 11.7         Left Arm  Lbs 5.8         Right Arm   Lbs 5.8     Clinical Medical Hx: Hyperglycemia, daytime sleepiness, HTN Medications: Lisinopril, Omeprazol, Simvistatin Labs: TG 237; HDL 36.6; VLDL 47.4; A1c 6.2% Notable Signs/Symptoms: none  Lifestyle & Dietary Hx 12-13 hrs/day x 5/days, 30 min for lunch Same sleep schedule on days off.   Estimated daily fluid intake: 16 oz water, 55 oz coffee, 10 oz body armour. Supplements: multivitamin Sleep: 5-7 hrs; up sometimes and can't go back to sleep. Stress / self-care: 30-45 min TV before bed to take edge off. Enjoys cars. Weekend hiking in the park Current average weekly physical activity: 15-20 min walk dog, no other activity  24-Hr Dietary Recall 6:30 am First Meal: hit and miss; egg & cheese omlette,8-10 oz orange juice. During week only 1x week cheerios 2% milk or everything bagel, light spread of reg cream cheese; daily coffee 4 T flavored creamer Snack: none 12-1 pm Second Meal: Ham & Cheese little mayo on pumpernickel  Snack: none OR jerky Third Meal: tilapia [place] dressing, broccoli no seasoning, 1 1/2 c brown rice Snack: 1x week dessert, cookie Beverages: 16 oz water, 55 oz coffee, 10 oz body armour.  Estimated Energy Needs Calories: 2100  NUTRITION DIAGNOSIS  NB-1.1 Food and nutrition-related knowledge deficit As related to importance of timing of  meals.  As evidenced by skipping breakfast, dinner main meal.   NUTRITION INTERVENTION  Nutrition education (E-1) on the following topics:  . Importance of paying attention to carb intake for blood sugar control . Balanced meals . Importance of sleep, stress management and exercise  Handouts Provided Include   A1c chart  Meal Planner  Learning Style & Readiness for Change Teaching method utilized: Visual & Auditory  Demonstrated degree of understanding via: Teach Back  Barriers to learning/adherence to lifestyle change: none  Goals Established by Pt  Change your meal structures to spread out through the day  Balanced meals and smaller portions of starches  Reduce coffee intake with 1/2 the amount of creamer  MONITORING & EVALUATION Dietary intake, weekly physical activity, and meal structures in 4-6 weeks.  Next Steps  Patient is to learn mindful eating.

## 2020-08-20 NOTE — Patient Instructions (Addendum)
Check out the website: [employer].pt Change your meal structures to spread out through the day Balanced meals and smaller portions of starches Reduce coffee intake with 1/2 the amount of creamer

## 2020-09-10 ENCOUNTER — Encounter (INDEPENDENT_AMBULATORY_CARE_PROVIDER_SITE_OTHER): Payer: Self-pay | Admitting: Otolaryngology

## 2020-09-10 ENCOUNTER — Other Ambulatory Visit: Payer: Self-pay

## 2020-09-10 ENCOUNTER — Ambulatory Visit (INDEPENDENT_AMBULATORY_CARE_PROVIDER_SITE_OTHER): Payer: 59 | Admitting: Otolaryngology

## 2020-09-10 VITALS — Temp 97.3°F

## 2020-09-10 DIAGNOSIS — H903 Sensorineural hearing loss, bilateral: Secondary | ICD-10-CM

## 2020-09-10 DIAGNOSIS — H9313 Tinnitus, bilateral: Secondary | ICD-10-CM | POA: Diagnosis not present

## 2020-09-10 NOTE — Progress Notes (Signed)
HPI: Lee Ballard is a 55 y.o. male who presents is referred by his PCP Dr. Sarajane Jews for evaluation of mixed hearing loss in his ears..  Patient has noted gradual decline in his hearing over the past 5 years.  He underwent audiologic testing with [facility] in February of this past year that demonstrated a mild mixed hearing loss in the lower frequencies in the right ear.  His right ear hearing is slightly worse than the left ear hearing.  SRT's were 30 dB on the right and 15 dB on the left.  He has a slight low-frequency conductive loss on the right side.  He had type A tympanograms bilaterally.  Past Medical History:  Diagnosis Date  . Edema leg   . Erectile dysfunction   . Family history of premature CAD 09/28/2016  . GERD (gastroesophageal reflux disease)   . Hyperlipidemia   . Hypertension   . Varicose vein of leg    Past Surgical History:  Procedure Laterality Date  . COLONOSCOPY  04/29/2015   per Dr. Henrene Pastor, hyperplastic polyps, repeat in 10 yrs   . ENDOVENOUS ABLATION SAPHENOUS VEIN W/ LASER Right   . TONSILLECTOMY     Social History   Socioeconomic History  . Marital status: Divorced    Spouse name: Not on file  . Number of children: Not on file  . Years of education: Not on file  . Highest education level: Not on file  Occupational History  . Not on file  Tobacco Use  . Smoking status: Never Smoker  . Smokeless tobacco: Never Used  Substance and Sexual Activity  . Alcohol use: No    Alcohol/week: 0.0 standard drinks  . Drug use: No  . Sexual activity: Not on file  Other Topics Concern  . Not on file  Social History Narrative  . Not on file   Social Determinants of Health   Financial Resource Strain: Not on file  Food Insecurity: Not on file  Transportation Needs: Not on file  Physical Activity: Not on file  Stress: Not on file  Social Connections: Not on file   Family History  Problem Relation Age of Onset  . Cancer Mother   . Heart attack Father 25  .  Colon cancer Neg Hx    No Known Allergies Prior to Admission medications   Medication Sig Start Date End Date Taking? Authorizing Provider  aspirin EC 81 MG tablet Take 1 tablet (81 mg total) by mouth daily. 10/16/16   Sueanne Margarita, MD  Calcium 200 MG TABS Take 400 mg by mouth daily.    [provider]  ibuprofen (ADVIL,MOTRIN) 200 MG tablet Take 200 mg by mouth every 6 (six) hours as needed.    [provider]  lisinopril (ZESTRIL) 30 MG tablet TAKE 1 TABLET BY MOUTH DAILY. (PLEASE MAKE YEARLY APPT WITH DR. Radford Pax FOR JAN FOR FUTURE REFILLS) 08/05/20   Laurey Morale, MD  Magnesium 400 MG CAPS Take by mouth.    [provider]  Multiple Vitamins-Minerals (MULTIVITAMIN,TX-MINERALS) tablet Take 1 tablet by mouth daily.      [provider]  Omega-3 Fatty Acids (FISH OIL) 1200 MG CAPS Take 1,200 mg by mouth.    [provider]  omeprazole (PRILOSEC) 40 MG capsule Take 1 capsule (40 mg total) by mouth in the morning and at bedtime. 08/05/20   Laurey Morale, MD  POTASSIUM PO Take 400 mg by mouth daily.    [provider]  simvastatin (  ZOCOR) 40 MG tablet TAKE 1 TABLET BY MOUTH EVERYDAY AT BEDTIME 04/16/20   Laurey Morale, MD  vardenafil (LEVITRA) 20 MG tablet Take 1 tablet (20 mg total) by mouth as needed for erectile dysfunction. 02/15/18 05/09/20  Laurey Morale, MD     Positive ROS: Otherwise negative  All other systems have been reviewed and were otherwise negative with the exception of those mentioned in the HPI and as above.  Physical Exam: Constitutional: Alert, well-appearing, no acute distress Ears: External ears without lesions or tenderness.  On microscopic exam both ear canals and TMs are clear.  TMs have good mobility on pneumatic otoscopy.  Subjectively when checking his hearing with the 512 1024 tuning fork he heard slightly better on the left compared to the right but [facility] was greater than BC bilaterally. Nasal: External nose  without lesions. Septum midline with mild rhinitis.. Clear nasal passages Oral: Lips and gums without lesions. Tongue and palate mucosa without lesions. Posterior oropharynx clear. Neck: No palpable adenopathy or masses Respiratory: Breathing comfortably  Skin: No facial/neck lesions or rash noted.  Procedures  Assessment: Predominately sensorineural hearing loss slightly worse on the right side. Normal TM evaluation bilaterally.  Plan: Discussed with him that he would be a candidate for hearing aids if he is having difficulty with his hearing. Discussed with him concerning the tinnitus concerning using masking noise to help control this. Also discussed with him concerning using ear protection when around loud noise as this can make the tinnitus worse. He will follow-up as needed.   Radene Journey, MD   CC:

## 2020-09-24 ENCOUNTER — Ambulatory Visit: Payer: 59 | Admitting: Registered"

## 2021-02-28 ENCOUNTER — Other Ambulatory Visit: Payer: Self-pay | Admitting: Family Medicine

## 2021-04-04 ENCOUNTER — Telehealth: Payer: 59 | Admitting: Family Medicine

## 2021-04-04 ENCOUNTER — Encounter: Payer: Self-pay | Admitting: Family Medicine

## 2021-04-04 DIAGNOSIS — S43421A Sprain of right rotator cuff capsule, initial encounter: Secondary | ICD-10-CM

## 2021-04-04 MED ORDER — METHYLPREDNISOLONE 4 MG PO TBPK: ORAL_TABLET | ORAL | 0 refills | Status: DC

## 2021-04-04 NOTE — Progress Notes (Signed)
Subjective:    Patient ID: Lee Ballard, male    DOB: Jan 13, 1966, 55 y.o.   MRN: [id number]  HPI Virtual Visit via Video Note  I connected with the patient on 04/04/21 at  4:00 PM EDT by a video enabled telemedicine application and verified that I am speaking with the correct person using two identifiers.  Location patient: home Location provider:work or home office Persons participating in the virtual visit: patient, provider  I discussed the limitations of evaluation and management by telemedicine and the availability of in person appointments. The patient expressed understanding and agreed to proceed.   HPI: Here for an injury to the right shoulder that occurred at home 3 days ago. While walking his dog, he tripped and fell backwards. He reached out behind him with his right arm as he fell. Since then he has had pain and stiffness in the shoulder. He has taken 600 mg of Ibuprofen and applied a topical aspirin product with no relief. He cannot raise his right arm to horizontal because of the pain.    ROS: See pertinent positives and negatives per HPI.  Past Medical History:  Diagnosis Date   Edema leg    Erectile dysfunction    Family history of premature CAD 09/28/2016   GERD (gastroesophageal reflux disease)    Hyperlipidemia    Hypertension    Varicose vein of leg     Past Surgical History:  Procedure Laterality Date   COLONOSCOPY  04/29/2015   per Dr. Henrene Pastor, hyperplastic polyps, repeat in 10 yrs    ENDOVENOUS ABLATION SAPHENOUS VEIN W/ LASER Right    TONSILLECTOMY      Family History  Problem Relation Age of Onset   Cancer Mother    Heart attack Father 44   Colon cancer Neg Hx      Current Outpatient Medications:    aspirin EC 81 MG tablet, Take 1 tablet (81 mg total) by mouth daily., Disp: 90 tablet, Rfl: 3   Calcium 200 MG TABS, Take 400 mg by mouth daily., Disp: , Rfl:    ibuprofen (ADVIL,MOTRIN) 200 MG tablet, Take 200 mg by mouth every 6 (six) hours as  needed., Disp: , Rfl:    lisinopril (ZESTRIL) 30 MG tablet, TAKE 1 TABLET BY MOUTH DAILY. (PLEASE MAKE YEARLY APPT WITH DR. Radford Pax FOR JAN FOR FUTURE REFILLS), Disp: 90 tablet, Rfl: 3   methylPREDNISolone (MEDROL DOSEPAK) 4 MG TBPK tablet, As directed, Disp: 21 tablet, Rfl: 0   Multiple Vitamins-Minerals (MULTIVITAMIN,TX-MINERALS) tablet, Take 1 tablet by mouth daily.  , Disp: , Rfl:    Omega-3 Fatty Acids (FISH OIL) 1200 MG CAPS, Take 1,200 mg by mouth., Disp: , Rfl:    omeprazole (PRILOSEC) 40 MG capsule, Take 1 capsule (40 mg total) by mouth in the morning and at bedtime., Disp: 180 capsule, Rfl: 3   POTASSIUM PO, Take 400 mg by mouth daily., Disp: , Rfl:    simvastatin (ZOCOR) 40 MG tablet, TAKE 1 TABLET BY MOUTH EVERYDAY AT BEDTIME, Disp: 90 tablet, Rfl: 0   vardenafil (LEVITRA) 20 MG tablet, Take 1 tablet (20 mg total) by mouth as needed for erectile dysfunction., Disp: 3 tablet, Rfl: 11  EXAM:  VITALS per patient if applicable:  GENERAL: alert, oriented, appears well and in no acute distress  HEENT: atraumatic, conjunttiva clear, no obvious abnormalities on inspection of external nose and ears  NECK: normal movements of the head and neck  LUNGS: on inspection no signs of respiratory distress, breathing rate  appears normal, no obvious gross SOB, gasping or wheezing  CV: no obvious cyanosis  MS: moves all visible extremities without noticeable abnormality  PSYCH/NEURO: pleasant and cooperative, no obvious depression or anxiety, speech and thought processing grossly intact  ASSESSMENT AND PLAN: Right shoulder sprain. He will apply ice packs QID and try a Medrol dose pack. He will follow up with [place] if not better in 2 weeks.  Alysia Penna, MD   Discussed the following assessment and plan:  No diagnosis found.     I discussed the assessment and treatment plan with the patient. The patient was provided an opportunity to ask questions and all were answered. The patient agreed  with the plan and demonstrated an understanding of the instructions.   The patient was advised to call back or seek an in-person evaluation if the symptoms worsen or if the condition fails to improve as anticipated.      Review of Systems     Objective:   Physical Exam        Assessment & Plan:

## 2021-05-04 ENCOUNTER — Encounter: Payer: Self-pay | Admitting: Family Medicine

## 2021-05-04 ENCOUNTER — Telehealth: Payer: 59 | Admitting: Family Medicine

## 2021-05-04 DIAGNOSIS — S43421D Sprain of right rotator cuff capsule, subsequent encounter: Secondary | ICD-10-CM | POA: Diagnosis not present

## 2021-05-04 NOTE — Progress Notes (Signed)
Subjective:    Patient ID: Lee Ballard, male    DOB: 03-24-1966, 55 y.o.   MRN: [id number]  HPI Virtual Visit via Video Note  I connected with the patient on 05/04/21 at  4:00 PM EDT by a video enabled telemedicine application and verified that I am speaking with the correct person using two identifiers.  Location patient: home Location provider:work or home office Persons participating in the virtual visit: patient, provider  I discussed the limitations of evaluation and management by telemedicine and the availability of in person appointments. The patient expressed understanding and agreed to proceed.   HPI: Here to follow up on a right shoulder injury that occurred at home a few weeks ago. He fell onto his right side. We had a virtual visit on 04-04-21 where he described pain in the lateral shoulder and reduced ROM. He could not raise his arm over his head. He has been icing it and he took a Medrol dose pack. This helped the pain somewhat, but it still bothers him. He is taking Ibuprofen.    ROS: See pertinent positives and negatives per HPI.  Past Medical History:  Diagnosis Date   Edema leg    Erectile dysfunction    Family history of premature CAD 09/28/2016   GERD (gastroesophageal reflux disease)    Hyperlipidemia    Hypertension    Varicose vein of leg     Past Surgical History:  Procedure Laterality Date   COLONOSCOPY  04/29/2015   per Dr. Henrene Pastor, hyperplastic polyps, repeat in 10 yrs    ENDOVENOUS ABLATION SAPHENOUS VEIN W/ LASER Right    TONSILLECTOMY      Family History  Problem Relation Age of Onset   Cancer Mother    Heart attack Father 81   Colon cancer Neg Hx      Current Outpatient Medications:    aspirin EC 81 MG tablet, Take 1 tablet (81 mg total) by mouth daily., Disp: 90 tablet, Rfl: 3   Calcium 200 MG TABS, Take 400 mg by mouth daily., Disp: , Rfl:    ibuprofen (ADVIL,MOTRIN) 200 MG tablet, Take 200 mg by mouth every 6 (six) hours as needed.,  Disp: , Rfl:    lisinopril (ZESTRIL) 30 MG tablet, TAKE 1 TABLET BY MOUTH DAILY. (PLEASE MAKE YEARLY APPT WITH DR. Radford Pax FOR JAN FOR FUTURE REFILLS), Disp: 90 tablet, Rfl: 3   Multiple Vitamins-Minerals (MULTIVITAMIN,TX-MINERALS) tablet, Take 1 tablet by mouth daily.  , Disp: , Rfl:    Omega-3 Fatty Acids (FISH OIL) 1200 MG CAPS, Take 1,200 mg by mouth., Disp: , Rfl:    omeprazole (PRILOSEC) 40 MG capsule, Take 1 capsule (40 mg total) by mouth in the morning and at bedtime., Disp: 180 capsule, Rfl: 3   POTASSIUM PO, Take 400 mg by mouth daily., Disp: , Rfl:    simvastatin (ZOCOR) 40 MG tablet, TAKE 1 TABLET BY MOUTH EVERYDAY AT BEDTIME, Disp: 90 tablet, Rfl: 0   vardenafil (LEVITRA) 20 MG tablet, Take 1 tablet (20 mg total) by mouth as needed for erectile dysfunction., Disp: 3 tablet, Rfl: 11  EXAM:  VITALS per patient if applicable:  GENERAL: alert, oriented, appears well and in no acute distress  HEENT: atraumatic, conjunttiva clear, no obvious abnormalities on inspection of external nose and ears  NECK: normal movements of the head and neck  LUNGS: on inspection no signs of respiratory distress, breathing rate appears normal, no obvious gross SOB, gasping or wheezing  CV: no obvious cyanosis  MS: moves all visible extremities without noticeable abnormality  PSYCH/NEURO: pleasant and cooperative, no obvious depression or anxiety, speech and thought processing grossly intact  ASSESSMENT AND PLAN: Shoulder sprain, possible rotator cuff injury. We will refer him to Orthopedics.  Alysia Penna, MD  Discussed the following assessment and plan:  Sprain of right rotator cuff capsule, subsequent encounter - Plan: Ambulatory referral to Orthopedic Surgery     I discussed the assessment and treatment plan with the patient. The patient was provided an opportunity to ask questions and all were answered. The patient agreed with the plan and demonstrated an understanding of the  instructions.   The patient was advised to call back or seek an in-person evaluation if the symptoms worsen or if the condition fails to improve as anticipated.      Review of Systems     Objective:   Physical Exam        Assessment & Plan:

## 2021-05-12 ENCOUNTER — Encounter (HOSPITAL_BASED_OUTPATIENT_CLINIC_OR_DEPARTMENT_OTHER): Payer: Self-pay | Admitting: Orthopaedic Surgery

## 2021-05-12 ENCOUNTER — Other Ambulatory Visit: Payer: Self-pay

## 2021-05-12 ENCOUNTER — Ambulatory Visit (HOSPITAL_BASED_OUTPATIENT_CLINIC_OR_DEPARTMENT_OTHER): Payer: 59 | Admitting: Orthopaedic Surgery

## 2021-05-12 ENCOUNTER — Ambulatory Visit (HOSPITAL_BASED_OUTPATIENT_CLINIC_OR_DEPARTMENT_OTHER)
Admission: RE | Admit: 2021-05-12 | Discharge: 2021-05-12 | Disposition: A | Discharge status: Home / Self Care | Payer: 59 | Source: Ambulatory Visit | Attending: Orthopaedic Surgery | Admitting: Orthopaedic Surgery

## 2021-05-12 VITALS — BP 161/90 | Ht 70.5 in | Wt 293.0 lb

## 2021-05-12 DIAGNOSIS — M25511 Pain in right shoulder: Secondary | ICD-10-CM

## 2021-05-12 DIAGNOSIS — S46009A Unspecified injury of muscle(s) and tendon(s) of the rotator cuff of unspecified shoulder, initial encounter: Secondary | ICD-10-CM

## 2021-05-12 NOTE — Progress Notes (Signed)
Chief Complaint: right shoulder pain     History of Present Illness:   Pain Score: 4/10 SANE: 70/100  Lee Ballard is a 55 y.o. male with right shoulder pain going on now for approximately 1 month.  He states that he did have an injury approximately 4 years prior where he was picking up heavy blocks in his yard and felt a pop in the shoulder.  At that time it took several months for the the pain to improve although he did not seek any specific treatment at that time.  He recently had a fall where he tripped over his Rottweiler approximately 1 month ago.  Since that time he has had significant shoulder pain about the lateral aspect of the shoulder.  The pain is sharp and constant.  He states that it is worse with certain overhead activities and is worse with laying directly on the side.  It is relieved with reaching overhead.  He has not had any injections.  He has done physical therapy in the past for other conditions.  He has been taking anti-inflammatories which only help a little.  He is right-hand dominant    Surgical History:   None  PMH/PSH/Family History/Social History/Meds/Allergies:    Past Medical History:  Diagnosis Date   Edema leg    Erectile dysfunction    Family history of premature CAD 09/28/2016   GERD (gastroesophageal reflux disease)    Hyperlipidemia    Hypertension    Varicose vein of leg    Past Surgical History:  Procedure Laterality Date   COLONOSCOPY  04/29/2015   per Dr. Henrene Pastor, hyperplastic polyps, repeat in 45 yrs    ENDOVENOUS ABLATION SAPHENOUS VEIN W/ LASER Right    TONSILLECTOMY     Social History   Socioeconomic History   Marital status: Divorced    Spouse name: Not on file   Number of children: Not on file   Years of education: Not on file   Highest education level: Not on file  Occupational History   Not on file  Tobacco Use   Smoking status: Never   Smokeless tobacco: Never  Substance and Sexual  Activity   Alcohol use: No    Alcohol/week: 0.0 standard drinks   Drug use: No   Sexual activity: Not on file  Other Topics Concern   Not on file  Social History Narrative   Not on file   Social Determinants of Health   Financial Resource Strain: Not on file  Food Insecurity: Not on file  Transportation Needs: Not on file  Physical Activity: Not on file  Stress: Not on file  Social Connections: Not on file   Family History  Problem Relation Age of Onset   Cancer Mother    Heart attack Father 101   Colon cancer Neg Hx    No Known Allergies Current Outpatient Medications  Medication Sig Dispense Refill   aspirin EC 81 MG tablet Take 1 tablet (81 mg total) by mouth daily. 90 tablet 3   Calcium 200 MG TABS Take 400 mg by mouth daily.     ibuprofen (ADVIL,MOTRIN) 200 MG tablet Take 200 mg by mouth every 6 (six) hours as needed.     lisinopril (ZESTRIL) 30 MG tablet TAKE 1 TABLET BY MOUTH DAILY. (PLEASE MAKE YEARLY APPT WITH DR. Radford Pax  FOR JAN FOR FUTURE REFILLS) 90 tablet 3   Multiple Vitamins-Minerals (MULTIVITAMIN,TX-MINERALS) tablet Take 1 tablet by mouth daily.       Omega-3 Fatty Acids (FISH OIL) 1200 MG CAPS Take 1,200 mg by mouth.     omeprazole (PRILOSEC) 40 MG capsule Take 1 capsule (40 mg total) by mouth in the morning and at bedtime. 180 capsule 3   POTASSIUM PO Take 400 mg by mouth daily.     simvastatin (ZOCOR) 40 MG tablet TAKE 1 TABLET BY MOUTH EVERYDAY AT BEDTIME 90 tablet 0   vardenafil (LEVITRA) 20 MG tablet Take 1 tablet (20 mg total) by mouth as needed for erectile dysfunction. 3 tablet 11   No current facility-administered medications for this visit.   No results found.  Review of Systems:   A ROS was performed including pertinent positives and negatives as documented in the HPI.  Physical Exam :   Constitutional: NAD and appears stated age Neurological: Alert and oriented Psych: Appropriate affect and cooperative Blood pressure (!) 161/90, height 5'  10.5" (1.791 m), weight 293 lb (132.9 kg).   Comprehensive Musculoskeletal Exam:    Musculoskeletal Exam    Inspection Right Left  Skin No atrophy or winging No atrophy or winging  Palpation    Tenderness Lateral shoulder None  Range of Motion    Flexion (passive) 170 170  Flexion (active) 180 180  Extension 30 30  Abduction 170 170  ER at the side 70 70  ER at 90 of abduction 90 90  IR at 90 of abduction 60 60  Can reach behind back to T10 T10  Strength     4 out of 5 supra and infraspinatus 5 out of 5 throughout  Special Tests    Pseudoparalytic No No  Neurologic    Fires PIN, radial, median, ulnar, musculocutaneous, axillary, suprascapular, long thoracic, and spinal accessory innervated muscles. No abnormal sensibility  Vascular/Lymphatic    Radial Pulse 2+ 2+  Cervical Exam    Patient has symmetric cervical range of motion with negative Spurling's test.  Special Test: Positive Neer impingement     Imaging:   Xray (3 views right shoulder): Normal  I personally reviewed and interpreted the radiographs.   Assessment:   55 year old male with right shoulder pain after a traumatic injury and fall onto the right shoulder approximately 1 month ago.  We discussed possible etiologies including rotator cuff tendinitis versus rotator cuff tear.  He does have some weakness on exam today and as such I have explained that I would like to obtain an MRI to rule out any type of acute on chronic rotator cuff injury.  This is particularly true given his recent injury.  He will also be referred to physical therapy for right shoulder strengthening and motion.  Plan :    -Physical therapy for right shoulder was ordered -MRI right shoulder to be obtained and he will follow-up following this discuss results  I believe that advance imaging in the form of an MRI is indicated for the following reasons: -Xrays images were obtained and not diagnostic -The patient has failed treatment  modalities including NSAID therapy -The following worrisome symptoms are present on history and exam: Weakness on exam with rotator cuff testing in the presence of a recent injury    I personally saw and evaluated the patient, and participated in the management and treatment plan.  Vanetta Mulders, MD Attending Physician, Orthopedic Surgery  This document was dictated using Dragon voice recognition software.  A reasonable attempt at proof reading has been made to minimize errors.

## 2021-05-26 ENCOUNTER — Ambulatory Visit (HOSPITAL_BASED_OUTPATIENT_CLINIC_OR_DEPARTMENT_OTHER): Payer: 59 | Admitting: Orthopaedic Surgery

## 2021-05-27 ENCOUNTER — Other Ambulatory Visit: Payer: Self-pay | Admitting: Family Medicine

## 2021-05-28 ENCOUNTER — Other Ambulatory Visit: Payer: Self-pay

## 2021-05-28 ENCOUNTER — Ambulatory Visit
Admission: RE | Admit: 2021-05-28 | Discharge: 2021-05-28 | Disposition: A | Discharge status: Home / Self Care | Payer: 59 | Source: Ambulatory Visit | Attending: Orthopaedic Surgery | Admitting: Orthopaedic Surgery

## 2021-05-28 DIAGNOSIS — S46009A Unspecified injury of muscle(s) and tendon(s) of the rotator cuff of unspecified shoulder, initial encounter: Secondary | ICD-10-CM

## 2021-05-28 DIAGNOSIS — M25511 Pain in right shoulder: Secondary | ICD-10-CM

## 2021-05-30 ENCOUNTER — Ambulatory Visit (HOSPITAL_BASED_OUTPATIENT_CLINIC_OR_DEPARTMENT_OTHER): Payer: 59 | Admitting: Orthopaedic Surgery

## 2021-05-30 ENCOUNTER — Ambulatory Visit (HOSPITAL_BASED_OUTPATIENT_CLINIC_OR_DEPARTMENT_OTHER): Payer: Self-pay | Admitting: Orthopaedic Surgery

## 2021-05-30 ENCOUNTER — Other Ambulatory Visit: Payer: Self-pay

## 2021-05-30 ENCOUNTER — Other Ambulatory Visit (HOSPITAL_BASED_OUTPATIENT_CLINIC_OR_DEPARTMENT_OTHER): Payer: Self-pay

## 2021-05-30 VITALS — BP 159/81 | Ht 73.0 in | Wt 293.0 lb

## 2021-05-30 DIAGNOSIS — S46009A Unspecified injury of muscle(s) and tendon(s) of the rotator cuff of unspecified shoulder, initial encounter: Secondary | ICD-10-CM

## 2021-05-30 MED ORDER — ASPIRIN EC 325 MG PO TBEC
325.0000 mg | DELAYED_RELEASE_TABLET | Freq: Every day | ORAL | 0 refills | Status: DC
  Filled 2021-05-30: qty 30, 30d supply, fill #0

## 2021-05-30 MED ORDER — IBUPROFEN 800 MG PO TABS
800.0000 mg | ORAL_TABLET | Freq: Three times a day (TID) | ORAL | 0 refills | Status: AC
  Filled 2021-05-30: qty 30, 10d supply, fill #0

## 2021-05-30 MED ORDER — ACETAMINOPHEN 500 MG PO TABS
500.0000 mg | ORAL_TABLET | Freq: Three times a day (TID) | ORAL | 0 refills | Status: AC
  Filled 2021-05-30: qty 30, 10d supply, fill #0

## 2021-05-30 MED ORDER — OXYCODONE HCL 5 MG PO TABS
5.0000 mg | ORAL_TABLET | ORAL | 0 refills | Status: DC | PRN
  Filled 2021-05-30: qty 20, 4d supply, fill #0

## 2021-05-30 NOTE — H&P (View-Only) (Signed)
Chief Complaint: right shoulder pain     History of Present Illness:   Pain Score: 4/10 SANE: 55/100  05/30/2021: Presents today for follow-up of the right shoulder status post MRI.  He continues to have decreased strength and significant limited motion.  He is using over-the-counter topical pain reliever.   Lee Ballard is a 55 y.o. RHD male with right shoulder pain going on now for approximately 1 month.  He states that he did have an injury approximately 4 years prior where he was picking up heavy blocks in his yard and felt a pop in the shoulder.  At that time it took several months for the the pain to improve although he did not seek any specific treatment at that time.  He recently had a fall where he tripped over his Rottweiler approximately 1 month ago.  Since that time he has had significant shoulder pain about the lateral aspect of the shoulder.  The pain is sharp and constant.  He states that it is worse with certain overhead activities and is worse with laying directly on the side.  It is relieved with reaching overhead.  He has not had any injections.  He has done physical therapy in the past for other conditions.  He has been taking anti-inflammatories which only help a little.  He is right-hand dominant    Surgical History:   None  PMH/PSH/Family History/Social History/Meds/Allergies:    Past Medical History:  Diagnosis Date   Edema leg    Erectile dysfunction    Family history of premature CAD 09/28/2016   GERD (gastroesophageal reflux disease)    Hyperlipidemia    Hypertension    Varicose vein of leg    Past Surgical History:  Procedure Laterality Date   COLONOSCOPY  04/29/2015   per Dr. Henrene Pastor, hyperplastic polyps, repeat in 41 yrs    ENDOVENOUS ABLATION SAPHENOUS VEIN W/ LASER Right    TONSILLECTOMY     Social History   Socioeconomic History   Marital status: Divorced    Spouse name: Not on file   Number of children: Not  on file   Years of education: Not on file   Highest education level: Not on file  Occupational History   Not on file  Tobacco Use   Smoking status: Never   Smokeless tobacco: Never  Substance and Sexual Activity   Alcohol use: No    Alcohol/week: 0.0 standard drinks   Drug use: No   Sexual activity: Not on file  Other Topics Concern   Not on file  Social History Narrative   Not on file   Social Determinants of Health   Financial Resource Strain: Not on file  Food Insecurity: Not on file  Transportation Needs: Not on file  Physical Activity: Not on file  Stress: Not on file  Social Connections: Not on file   Family History  Problem Relation Age of Onset   Cancer Mother    Heart attack Father 66   Colon cancer Neg Hx    No Known Allergies Current Outpatient Medications  Medication Sig Dispense Refill   aspirin EC 81 MG tablet Take 1 tablet (81 mg total) by mouth daily. 90 tablet 3   Calcium 200 MG TABS Take 400 mg by mouth daily.     ibuprofen (ADVIL,MOTRIN) 200 MG  tablet Take 200 mg by mouth every 6 (six) hours as needed.     lisinopril (ZESTRIL) 30 MG tablet TAKE 1 TABLET BY MOUTH DAILY. (PLEASE MAKE YEARLY APPT WITH DR. Radford Pax FOR JAN FOR FUTURE REFILLS) 90 tablet 3   Multiple Vitamins-Minerals (MULTIVITAMIN,TX-MINERALS) tablet Take 1 tablet by mouth daily.       Omega-3 Fatty Acids (FISH OIL) 1200 MG CAPS Take 1,200 mg by mouth.     omeprazole (PRILOSEC) 40 MG capsule Take 1 capsule (40 mg total) by mouth in the morning and at bedtime. 180 capsule 3   POTASSIUM PO Take 400 mg by mouth daily.     simvastatin (ZOCOR) 40 MG tablet TAKE 1 TABLET BY MOUTH EVERYDAY AT BEDTIME 90 tablet 0   vardenafil (LEVITRA) 20 MG tablet Take 1 tablet (20 mg total) by mouth as needed for erectile dysfunction. 3 tablet 11   No current facility-administered medications for this visit.   No results found.  Review of Systems:   A ROS was performed including pertinent positives and  negatives as documented in the HPI.  Physical Exam :   Constitutional: NAD and appears stated age Neurological: Alert and oriented Psych: Appropriate affect and cooperative Blood pressure (!) 159/81, height 6\' 1"  (1.854 m), weight 293 lb (132.9 kg).   Comprehensive Musculoskeletal Exam:    Musculoskeletal Exam    Inspection Right Left  Skin No atrophy or winging No atrophy or winging  Palpation    Tenderness Lateral shoulder None  Range of Motion    Flexion (passive) 170 170  Flexion (active) 180 180  Extension 30 30  Abduction 170 170  ER at the side 70 70  ER at 90 of abduction 90 90  IR at 90 of abduction 60 60  Can reach behind back to L1 T10  Strength     4 out of 5 supra and infraspinatus. Weak belly press 5 out of 5 throughout  Special Tests    Pseudoparalytic No No  Neurologic    Fires PIN, radial, median, ulnar, musculocutaneous, axillary, suprascapular, long thoracic, and spinal accessory innervated muscles. No abnormal sensibility  Vascular/Lymphatic    Radial Pulse 2+ 2+  Cervical Exam    Patient has symmetric cervical range of motion with negative Spurling's test.  Special Test: Positive Neer impingement     Imaging:   Xray (3 views right shoulder): Normal  MRI right shoulder: There is a full-thickness tear of the right supraspinatus tendon with minimal retraction.  Muscle quality appears good on T1 images.  Negative tangent sign.  There is an equivocally positive tear of the subscapularis tendon with significant biceps edema/fluid.  I personally reviewed and interpreted the radiographs.   Assessment:   55 year old male with right shoulder pain after a traumatic injury and fall onto the right shoulder approximately 1 month ago.  We discussed that unfortunately he does have a full-thickness tear of the supraspinatus after his traumatic fall 6 weeks prior.  Given the acute nature of the injury, I have recommended that we perform surgery.  Specifically we  did perform an arthroscopic surgery with rotator cuff repair.  At that time I would visualize the subscapularis tendon and perform a repair of that as needed.  We discussed that because this is an acute injury, acute surgical intervention is recommended to prevent long-term effects of having a full-thickness tear.  These include fatty muscle degeneration which can ultimately lead to tendon retraction and degeneration.  The shoulder was completely normal prior  to this fall.  He would like to proceed with right arthroscopic surgery and rotator cuff repair after discussion of the risks and benefits.  Plan :    -Plan for right shoulder arthroscopy with rotator cuff repair -Sling provided for right shoulder for postoperative. -Postoperative medications ordered -PT ordered for postop   I personally saw and evaluated the patient, and participated in the management and treatment plan.  Vanetta Mulders, MD Attending Physician, Orthopedic Surgery  This document was dictated using Dragon voice recognition software. A reasonable attempt at proof reading has been made to minimize errors.

## 2021-05-30 NOTE — Progress Notes (Signed)
Chief Complaint: right shoulder pain     History of Present Illness:   Pain Score: 4/10 SANE: 55/100  05/30/2021: Presents today for follow-up of the right shoulder status post MRI.  He continues to have decreased strength and significant limited motion.  He is using over-the-counter topical pain reliever.   Lee Ballard is a 55 y.o. RHD male with right shoulder pain going on now for approximately 1 month.  He states that he did have an injury approximately 4 years prior where he was picking up heavy blocks in his yard and felt a pop in the shoulder.  At that time it took several months for the the pain to improve although he did not seek any specific treatment at that time.  He recently had a fall where he tripped over his Rottweiler approximately 1 month ago.  Since that time he has had significant shoulder pain about the lateral aspect of the shoulder.  The pain is sharp and constant.  He states that it is worse with certain overhead activities and is worse with laying directly on the side.  It is relieved with reaching overhead.  He has not had any injections.  He has done physical therapy in the past for other conditions.  He has been taking anti-inflammatories which only help a little.  He is right-hand dominant    Surgical History:   None  PMH/PSH/Family History/Social History/Meds/Allergies:    Past Medical History:  Diagnosis Date   Edema leg    Erectile dysfunction    Family history of premature CAD 09/28/2016   GERD (gastroesophageal reflux disease)    Hyperlipidemia    Hypertension    Varicose vein of leg    Past Surgical History:  Procedure Laterality Date   COLONOSCOPY  04/29/2015   per Dr. Henrene Pastor, hyperplastic polyps, repeat in 55 yrs    ENDOVENOUS ABLATION SAPHENOUS VEIN W/ LASER Right    TONSILLECTOMY     Social History   Socioeconomic History   Marital status: Divorced    Spouse name: Not on file   Number of children: Not  on file   Years of education: Not on file   Highest education level: Not on file  Occupational History   Not on file  Tobacco Use   Smoking status: Never   Smokeless tobacco: Never  Substance and Sexual Activity   Alcohol use: No    Alcohol/week: 0.0 standard drinks   Drug use: No   Sexual activity: Not on file  Other Topics Concern   Not on file  Social History Narrative   Not on file   Social Determinants of Health   Financial Resource Strain: Not on file  Food Insecurity: Not on file  Transportation Needs: Not on file  Physical Activity: Not on file  Stress: Not on file  Social Connections: Not on file   Family History  Problem Relation Age of Onset   Cancer Mother    Heart attack Father 26   Colon cancer Neg Hx    No Known Allergies Current Outpatient Medications  Medication Sig Dispense Refill   aspirin EC 81 MG tablet Take 1 tablet (81 mg total) by mouth daily. 90 tablet 3   Calcium 200 MG TABS Take 400 mg by mouth daily.     ibuprofen (ADVIL,MOTRIN) 200 MG  tablet Take 200 mg by mouth every 6 (six) hours as needed.     lisinopril (ZESTRIL) 30 MG tablet TAKE 1 TABLET BY MOUTH DAILY. (PLEASE MAKE YEARLY APPT WITH DR. Radford Pax FOR JAN FOR FUTURE REFILLS) 90 tablet 3   Multiple Vitamins-Minerals (MULTIVITAMIN,TX-MINERALS) tablet Take 1 tablet by mouth daily.       Omega-3 Fatty Acids (FISH OIL) 1200 MG CAPS Take 1,200 mg by mouth.     omeprazole (PRILOSEC) 40 MG capsule Take 1 capsule (40 mg total) by mouth in the morning and at bedtime. 180 capsule 3   POTASSIUM PO Take 400 mg by mouth daily.     simvastatin (ZOCOR) 40 MG tablet TAKE 1 TABLET BY MOUTH EVERYDAY AT BEDTIME 90 tablet 0   vardenafil (LEVITRA) 20 MG tablet Take 1 tablet (20 mg total) by mouth as needed for erectile dysfunction. 3 tablet 11   No current facility-administered medications for this visit.   No results found.  Review of Systems:   A ROS was performed including pertinent positives and  negatives as documented in the HPI.  Physical Exam :   Constitutional: NAD and appears stated age Neurological: Alert and oriented Psych: Appropriate affect and cooperative Blood pressure (!) 159/81, height 6\' 1"  (1.854 m), weight 293 lb (132.9 kg).   Comprehensive Musculoskeletal Exam:    Musculoskeletal Exam    Inspection Right Left  Skin No atrophy or winging No atrophy or winging  Palpation    Tenderness Lateral shoulder None  Range of Motion    Flexion (passive) 170 170  Flexion (active) 180 180  Extension 30 30  Abduction 170 170  ER at the side 70 70  ER at 90 of abduction 90 90  IR at 90 of abduction 60 60  Can reach behind back to L1 T10  Strength     4 out of 5 supra and infraspinatus. Weak belly press 5 out of 5 throughout  Special Tests    Pseudoparalytic No No  Neurologic    Fires PIN, radial, median, ulnar, musculocutaneous, axillary, suprascapular, long thoracic, and spinal accessory innervated muscles. No abnormal sensibility  Vascular/Lymphatic    Radial Pulse 2+ 2+  Cervical Exam    Patient has symmetric cervical range of motion with negative Spurling's test.  Special Test: Positive Neer impingement     Imaging:   Xray (3 views right shoulder): Normal  MRI right shoulder: There is a full-thickness tear of the right supraspinatus tendon with minimal retraction.  Muscle quality appears good on T1 images.  Negative tangent sign.  There is an equivocally positive tear of the subscapularis tendon with significant biceps edema/fluid.  I personally reviewed and interpreted the radiographs.   Assessment:   55 year old male with right shoulder pain after a traumatic injury and fall onto the right shoulder approximately 1 month ago.  We discussed that unfortunately he does have a full-thickness tear of the supraspinatus after his traumatic fall 6 weeks prior.  Given the acute nature of the injury, I have recommended that we perform surgery.  Specifically we  did perform an arthroscopic surgery with rotator cuff repair.  At that time I would visualize the subscapularis tendon and perform a repair of that as needed.  We discussed that because this is an acute injury, acute surgical intervention is recommended to prevent long-term effects of having a full-thickness tear.  These include fatty muscle degeneration which can ultimately lead to tendon retraction and degeneration.  The shoulder was completely normal prior  to this fall.  He would like to proceed with right arthroscopic surgery and rotator cuff repair after discussion of the risks and benefits.  Plan :    -Plan for right shoulder arthroscopy with rotator cuff repair -Sling provided for right shoulder for postoperative. -Postoperative medications ordered -PT ordered for postop   I personally saw and evaluated the patient, and participated in the management and treatment plan.  Vanetta Mulders, MD Attending Physician, Orthopedic Surgery  This document was dictated using Dragon voice recognition software. A reasonable attempt at proof reading has been made to minimize errors.

## 2021-06-03 ENCOUNTER — Encounter (HOSPITAL_BASED_OUTPATIENT_CLINIC_OR_DEPARTMENT_OTHER): Payer: Self-pay | Admitting: Orthopaedic Surgery

## 2021-06-03 ENCOUNTER — Other Ambulatory Visit: Payer: Self-pay

## 2021-06-06 ENCOUNTER — Other Ambulatory Visit: Payer: Self-pay | Admitting: Family Medicine

## 2021-06-06 NOTE — Telephone Encounter (Signed)
Last refill- 08/05/20--90 tabs, 3 refills Last VV- 05/04/20  No future office visit scheduled   **last refill states " Patient to make appointment with Dr. Radford Pax for future refills."      Is okay to send refills?  Please advise

## 2021-06-06 NOTE — Telephone Encounter (Signed)
This request was Refused, this refill was requested to soon.   Refill due   Dec 2022.

## 2021-06-06 NOTE — Telephone Encounter (Signed)
I see what you mean. No we should NOT refill this, he will need to contact Dr. Radford Pax

## 2021-06-10 ENCOUNTER — Encounter (HOSPITAL_BASED_OUTPATIENT_CLINIC_OR_DEPARTMENT_OTHER)
Admission: RE | Admit: 2021-06-10 | Discharge: 2021-06-10 | Disposition: A | Discharge status: Home / Self Care | Payer: 59 | Source: Ambulatory Visit | Attending: Orthopaedic Surgery | Admitting: Orthopaedic Surgery

## 2021-06-10 DIAGNOSIS — Z01810 Encounter for preprocedural cardiovascular examination: Secondary | ICD-10-CM | POA: Insufficient documentation

## 2021-06-10 NOTE — Progress Notes (Signed)

## 2021-06-12 ENCOUNTER — Other Ambulatory Visit: Payer: Self-pay | Admitting: Family Medicine

## 2021-06-13 ENCOUNTER — Ambulatory Visit (HOSPITAL_BASED_OUTPATIENT_CLINIC_OR_DEPARTMENT_OTHER): Payer: 59 | Admitting: Anesthesiology

## 2021-06-13 ENCOUNTER — Other Ambulatory Visit: Payer: Self-pay

## 2021-06-13 ENCOUNTER — Encounter (HOSPITAL_BASED_OUTPATIENT_CLINIC_OR_DEPARTMENT_OTHER)
Admission: RE | Disposition: A | Discharge status: Home / Self Care | Payer: Self-pay | Source: Home / Self Care | Attending: Orthopaedic Surgery

## 2021-06-13 ENCOUNTER — Ambulatory Visit (HOSPITAL_BASED_OUTPATIENT_CLINIC_OR_DEPARTMENT_OTHER)
Admission: RE | Admit: 2021-06-13 | Discharge: 2021-06-13 | Disposition: A | Discharge status: Home / Self Care | Payer: 59 | Attending: Orthopaedic Surgery | Admitting: Orthopaedic Surgery

## 2021-06-13 ENCOUNTER — Encounter (HOSPITAL_BASED_OUTPATIENT_CLINIC_OR_DEPARTMENT_OTHER): Payer: Self-pay | Admitting: Orthopaedic Surgery

## 2021-06-13 ENCOUNTER — Other Ambulatory Visit (HOSPITAL_BASED_OUTPATIENT_CLINIC_OR_DEPARTMENT_OTHER): Payer: Self-pay | Admitting: Orthopaedic Surgery

## 2021-06-13 DIAGNOSIS — M7551 Bursitis of right shoulder: Secondary | ICD-10-CM

## 2021-06-13 DIAGNOSIS — E785 Hyperlipidemia, unspecified: Secondary | ICD-10-CM | POA: Insufficient documentation

## 2021-06-13 DIAGNOSIS — Z791 Long term (current) use of non-steroidal anti-inflammatories (NSAID): Secondary | ICD-10-CM | POA: Insufficient documentation

## 2021-06-13 DIAGNOSIS — S46009A Unspecified injury of muscle(s) and tendon(s) of the rotator cuff of unspecified shoulder, initial encounter: Secondary | ICD-10-CM

## 2021-06-13 DIAGNOSIS — Z7982 Long term (current) use of aspirin: Secondary | ICD-10-CM | POA: Diagnosis not present

## 2021-06-13 DIAGNOSIS — I10 Essential (primary) hypertension: Secondary | ICD-10-CM | POA: Insufficient documentation

## 2021-06-13 DIAGNOSIS — K219 Gastro-esophageal reflux disease without esophagitis: Secondary | ICD-10-CM | POA: Insufficient documentation

## 2021-06-13 DIAGNOSIS — S46011D Strain of muscle(s) and tendon(s) of the rotator cuff of right shoulder, subsequent encounter: Secondary | ICD-10-CM

## 2021-06-13 DIAGNOSIS — M75121 Complete rotator cuff tear or rupture of right shoulder, not specified as traumatic: Secondary | ICD-10-CM | POA: Insufficient documentation

## 2021-06-13 DIAGNOSIS — R000 Tachycardia, unspecified: Secondary | ICD-10-CM | POA: Diagnosis not present

## 2021-06-13 DIAGNOSIS — Z79899 Other long term (current) drug therapy: Secondary | ICD-10-CM | POA: Diagnosis not present

## 2021-06-13 DIAGNOSIS — Z6838 Body mass index (BMI) 38.0-38.9, adult: Secondary | ICD-10-CM | POA: Insufficient documentation

## 2021-06-13 DIAGNOSIS — S46011A Strain of muscle(s) and tendon(s) of the rotator cuff of right shoulder, initial encounter: Secondary | ICD-10-CM

## 2021-06-13 HISTORY — PX: SHOULDER ARTHROSCOPY WITH ROTATOR CUFF REPAIR AND SUBACROMIAL DECOMPRESSION: SHX5686

## 2021-06-13 SURGERY — SHOULDER ARTHROSCOPY WITH ROTATOR CUFF REPAIR AND SUBACROMIAL DECOMPRESSION
Anesthesia: General | Site: Shoulder | Laterality: Right

## 2021-06-13 MED ORDER — SODIUM CHLORIDE 0.9 % IR SOLN
Status: DC | PRN
  Administered 2021-06-13: 18000 mL

## 2021-06-13 MED ORDER — PROPOFOL 10 MG/ML IV BOLUS
INTRAVENOUS | Status: AC
  Filled 2021-06-13: qty 20

## 2021-06-13 MED ORDER — OXYCODONE HCL 5 MG PO TABS: 5.0000 mg | ORAL_TABLET | Freq: Once | ORAL | Status: DC | PRN

## 2021-06-13 MED ORDER — ONDANSETRON HCL 4 MG/2ML IJ SOLN
4.0000 mg | Freq: Once | INTRAMUSCULAR | Status: DC | PRN
Start: 2021-06-13 — End: 2021-06-13

## 2021-06-13 MED ORDER — ACETAMINOPHEN 325 MG PO TABS: 325.0000 mg | ORAL_TABLET | ORAL | Status: DC | PRN

## 2021-06-13 MED ORDER — BUPIVACAINE LIPOSOME 1.3 % IJ SUSP
INTRAMUSCULAR | Status: DC | PRN
  Administered 2021-06-13: 10 mL via PERINEURAL

## 2021-06-13 MED ORDER — DEXAMETHASONE SODIUM PHOSPHATE 10 MG/ML IJ SOLN
INTRAMUSCULAR | Status: AC
  Filled 2021-06-13: qty 1

## 2021-06-13 MED ORDER — PHENYLEPHRINE 40 MCG/ML (10ML) SYRINGE FOR IV PUSH (FOR BLOOD PRESSURE SUPPORT)
PREFILLED_SYRINGE | INTRAVENOUS | Status: AC
  Filled 2021-06-13: qty 10

## 2021-06-13 MED ORDER — BUPIVACAINE HCL (PF) 0.5 % IJ SOLN
INTRAMUSCULAR | Status: AC
  Filled 2021-06-13: qty 30

## 2021-06-13 MED ORDER — ACETAMINOPHEN 500 MG PO TABS
1000.0000 mg | ORAL_TABLET | Freq: Once | ORAL | Status: AC
  Administered 2021-06-13: 1000 mg via ORAL

## 2021-06-13 MED ORDER — FENTANYL CITRATE (PF) 100 MCG/2ML IJ SOLN: 25.0000 ug | INTRAMUSCULAR | Status: DC | PRN

## 2021-06-13 MED ORDER — PHENYLEPHRINE HCL (PRESSORS) 10 MG/ML IV SOLN
INTRAVENOUS | Status: AC
  Filled 2021-06-13: qty 1

## 2021-06-13 MED ORDER — DEXAMETHASONE SODIUM PHOSPHATE 10 MG/ML IJ SOLN
INTRAMUSCULAR | Status: DC | PRN
  Administered 2021-06-13: 5 mg via INTRAVENOUS

## 2021-06-13 MED ORDER — OXYCODONE HCL 5 MG/5ML PO SOLN: 5.0000 mg | Freq: Once | ORAL | Status: DC | PRN

## 2021-06-13 MED ORDER — LACTATED RINGERS IV SOLN: INTRAVENOUS | Status: DC

## 2021-06-13 MED ORDER — ROCURONIUM BROMIDE 10 MG/ML (PF) SYRINGE
PREFILLED_SYRINGE | INTRAVENOUS | Status: AC
  Filled 2021-06-13: qty 10

## 2021-06-13 MED ORDER — GABAPENTIN 300 MG PO CAPS
300.0000 mg | ORAL_CAPSULE | Freq: Once | ORAL | Status: AC
  Administered 2021-06-13: 300 mg via ORAL

## 2021-06-13 MED ORDER — BUPIVACAINE HCL (PF) 0.25 % IJ SOLN
INTRAMUSCULAR | Status: AC
  Filled 2021-06-13: qty 30

## 2021-06-13 MED ORDER — TRANEXAMIC ACID-NACL 1000-0.7 MG/100ML-% IV SOLN
INTRAVENOUS | Status: AC
  Filled 2021-06-13: qty 100

## 2021-06-13 MED ORDER — PROPOFOL 10 MG/ML IV BOLUS
INTRAVENOUS | Status: DC | PRN
  Administered 2021-06-13: 200 mg via INTRAVENOUS

## 2021-06-13 MED ORDER — BUPIVACAINE-EPINEPHRINE (PF) 0.5% -1:200000 IJ SOLN
INTRAMUSCULAR | Status: DC | PRN
  Administered 2021-06-13: 15 mL via PERINEURAL

## 2021-06-13 MED ORDER — MIDAZOLAM HCL 2 MG/2ML IJ SOLN
INTRAMUSCULAR | Status: AC
  Filled 2021-06-13: qty 2

## 2021-06-13 MED ORDER — CEFAZOLIN SODIUM-DEXTROSE 2-4 GM/100ML-% IV SOLN
2.0000 g | INTRAVENOUS | Status: AC
  Administered 2021-06-13: 3 g via INTRAVENOUS

## 2021-06-13 MED ORDER — ONDANSETRON HCL 4 MG/2ML IJ SOLN
INTRAMUSCULAR | Status: DC | PRN
  Administered 2021-06-13: 4 mg via INTRAVENOUS

## 2021-06-13 MED ORDER — EPINEPHRINE PF 1 MG/ML IJ SOLN
INTRAMUSCULAR | Status: AC
  Filled 2021-06-13: qty 2

## 2021-06-13 MED ORDER — TRANEXAMIC ACID-NACL 1000-0.7 MG/100ML-% IV SOLN
1000.0000 mg | INTRAVENOUS | Status: AC
  Administered 2021-06-13: 1000 mg via INTRAVENOUS

## 2021-06-13 MED ORDER — MEPERIDINE HCL 25 MG/ML IJ SOLN: 6.2500 mg | INTRAMUSCULAR | Status: DC | PRN

## 2021-06-13 MED ORDER — ONDANSETRON HCL 4 MG/2ML IJ SOLN
INTRAMUSCULAR | Status: AC
  Filled 2021-06-13: qty 2

## 2021-06-13 MED ORDER — ACETAMINOPHEN 160 MG/5ML PO SOLN: 325.0000 mg | ORAL | Status: DC | PRN

## 2021-06-13 MED ORDER — LIDOCAINE 2% (20 MG/ML) 5 ML SYRINGE
INTRAMUSCULAR | Status: AC
  Filled 2021-06-13: qty 5

## 2021-06-13 MED ORDER — FENTANYL CITRATE (PF) 100 MCG/2ML IJ SOLN
INTRAMUSCULAR | Status: AC
  Filled 2021-06-13: qty 2

## 2021-06-13 MED ORDER — ACETAMINOPHEN 500 MG PO TABS
ORAL_TABLET | ORAL | Status: AC
  Filled 2021-06-13: qty 2

## 2021-06-13 MED ORDER — SODIUM CHLORIDE 0.9 % IR SOLN
Status: DC | PRN
  Administered 2021-06-13: 6000 mL

## 2021-06-13 MED ORDER — FENTANYL CITRATE (PF) 100 MCG/2ML IJ SOLN
100.0000 ug | Freq: Once | INTRAMUSCULAR | Status: AC
Start: 2021-06-13 — End: 2021-06-13
  Administered 2021-06-13: 100 ug via INTRAVENOUS

## 2021-06-13 MED ORDER — MIDAZOLAM HCL 2 MG/2ML IJ SOLN
2.0000 mg | Freq: Once | INTRAMUSCULAR | Status: AC
  Administered 2021-06-13: 2 mg via INTRAVENOUS

## 2021-06-13 MED ORDER — GABAPENTIN 300 MG PO CAPS
ORAL_CAPSULE | ORAL | Status: AC
  Filled 2021-06-13: qty 1

## 2021-06-13 MED ORDER — LIDOCAINE 2% (20 MG/ML) 5 ML SYRINGE
INTRAMUSCULAR | Status: DC | PRN
  Administered 2021-06-13: 100 mg via INTRAVENOUS

## 2021-06-13 MED ORDER — ROCURONIUM BROMIDE 100 MG/10ML IV SOLN
INTRAVENOUS | Status: DC | PRN
  Administered 2021-06-13: 80 mg via INTRAVENOUS

## 2021-06-13 MED ORDER — EPHEDRINE 5 MG/ML INJ
INTRAVENOUS | Status: AC
  Filled 2021-06-13: qty 5

## 2021-06-13 MED ORDER — FENTANYL CITRATE (PF) 100 MCG/2ML IJ SOLN
INTRAMUSCULAR | Status: DC | PRN
  Administered 2021-06-13: 50 ug via INTRAVENOUS

## 2021-06-13 MED ORDER — PHENYLEPHRINE HCL (PRESSORS) 10 MG/ML IV SOLN
INTRAVENOUS | Status: DC | PRN
  Administered 2021-06-13 (×4): 80 ug via INTRAVENOUS

## 2021-06-13 MED ORDER — EPHEDRINE SULFATE 50 MG/ML IJ SOLN
INTRAMUSCULAR | Status: DC | PRN
  Administered 2021-06-13: 10 mg via INTRAVENOUS

## 2021-06-13 MED ORDER — SUGAMMADEX SODIUM 500 MG/5ML IV SOLN
INTRAVENOUS | Status: DC | PRN
  Administered 2021-06-13: 300 mg via INTRAVENOUS

## 2021-06-13 MED ORDER — PHENYLEPHRINE HCL-NACL 20-0.9 MG/250ML-% IV SOLN
INTRAVENOUS | Status: DC | PRN
Start: 2021-06-13 — End: 2021-06-13
  Administered 2021-06-13: 25 ug/min via INTRAVENOUS

## 2021-06-13 MED ORDER — CEFAZOLIN IN SODIUM CHLORIDE 3-0.9 GM/100ML-% IV SOLN
INTRAVENOUS | Status: AC
  Filled 2021-06-13: qty 100

## 2021-06-13 SURGICAL SUPPLY — 63 items
AID PSTN UNV HD RSTRNT DISP (MISCELLANEOUS) ×1
ANCH SUT 2 SWLK 19.1 CLS EYLT (Anchor) ×2 IMPLANT
ANCHOR SWIVELOCK BIO 4.75X19.1 (Anchor) ×4 IMPLANT
APL PRP STRL LF DISP 70% ISPRP (MISCELLANEOUS) ×2
BLADE EXCALIBUR 4.0X13 (MISCELLANEOUS) ×2 IMPLANT
BURR OVAL 8 FLU 4.0X13 (MISCELLANEOUS) ×2 IMPLANT
CANNULA 5.75X71 LONG (CANNULA) IMPLANT
CANNULA 7X7 TWIST-IN (CANNULA) IMPLANT
CANNULA PASSPORT 5 (CANNULA) IMPLANT
CANNULA PASSPORT BUTTON 10-40 (CANNULA) ×2 IMPLANT
CANNULA TWIST IN 8.25X7CM (CANNULA) ×4 IMPLANT
CHLORAPREP W/TINT 26 (MISCELLANEOUS) ×4 IMPLANT
COOLER ICEMAN CLASSIC (MISCELLANEOUS) ×2 IMPLANT
DRAPE IMP U-DRAPE 54X76 (DRAPES) ×2 IMPLANT
DRAPE INCISE IOBAN 66X45 STRL (DRAPES) ×2 IMPLANT
DRAPE SHOULDER BEACH CHAIR (DRAPES) ×2 IMPLANT
DRSG PAD ABDOMINAL 8X10 ST (GAUZE/BANDAGES/DRESSINGS) ×2 IMPLANT
DRSG TEGADERM 2-3/8X2-3/4 SM (GAUZE/BANDAGES/DRESSINGS) IMPLANT
DW OUTFLOW CASSETTE/TUBE SET (MISCELLANEOUS) ×2 IMPLANT
GAUZE SPONGE 4X4 12PLY STRL (GAUZE/BANDAGES/DRESSINGS) ×2 IMPLANT
GAUZE XEROFORM 1X8 LF (GAUZE/BANDAGES/DRESSINGS) ×2 IMPLANT
GLOVE SRG 8 PF TXTR STRL LF DI (GLOVE) ×1 IMPLANT
GLOVE SURG ENC MOIS LTX SZ6 (GLOVE) ×4 IMPLANT
GLOVE SURG ENC MOIS LTX SZ7.5 (GLOVE) ×2 IMPLANT
GLOVE SURG UNDER POLY LF SZ6.5 (GLOVE) ×4 IMPLANT
GLOVE SURG UNDER POLY LF SZ7 (GLOVE) ×4 IMPLANT
GLOVE SURG UNDER POLY LF SZ8 (GLOVE) ×2
GOWN STRL REUS W/ TWL LRG LVL3 (GOWN DISPOSABLE) ×2 IMPLANT
GOWN STRL REUS W/ TWL XL LVL3 (GOWN DISPOSABLE) ×2 IMPLANT
GOWN STRL REUS W/TWL LRG LVL3 (GOWN DISPOSABLE) ×4 IMPLANT
GOWN STRL REUS W/TWL XL LVL3 (GOWN DISPOSABLE) ×4
KIT STABILIZATION SHOULDER (MISCELLANEOUS) ×2 IMPLANT
KIT STR SPEAR 1.8 FBRTK DISP (KITS) IMPLANT
LASSO 90 CVE QUICKPAS (DISPOSABLE) IMPLANT
LASSO CRESCENT QUICKPASS (SUTURE) IMPLANT
MANIFOLD NEPTUNE II (INSTRUMENTS) ×2 IMPLANT
NDL SAFETY ECLIPSE 18X1.5 (NEEDLE) IMPLANT
NEEDLE HYPO 18GX1.5 SHARP (NEEDLE)
NEEDLE SCORPION MULTI FIRE (NEEDLE) ×2 IMPLANT
PACK ARTHROSCOPY DSU (CUSTOM PROCEDURE TRAY) ×2 IMPLANT
PACK BASIN DAY SURGERY FS (CUSTOM PROCEDURE TRAY) ×2 IMPLANT
PAD COLD SHLDR WRAP-ON (PAD) ×2 IMPLANT
PAD ORTHO SHOULDER 7X19 LRG (SOFTGOODS) IMPLANT
PORT APPOLLO RF 90DEGREE MULTI (SURGICAL WAND) ×2 IMPLANT
RESTRAINT HEAD UNIVERSAL NS (MISCELLANEOUS) ×2 IMPLANT
SHEET MEDIUM DRAPE 40X70 STRL (DRAPES) ×2 IMPLANT
SLEEVE SCD COMPRESS KNEE MED (STOCKING) ×2 IMPLANT
SPONGE T-LAP 4X18 ~~LOC~~+RFID (SPONGE) ×2 IMPLANT
SUT ETHILON 3 0 PS 1 (SUTURE) ×2 IMPLANT
SUT FIBERWIRE #2 38 T-5 BLUE (SUTURE)
SUT PDS AB 1 CT  36 (SUTURE)
SUT PDS AB 1 CT 36 (SUTURE) IMPLANT
SUT TIGER TAPE 7 IN WHITE (SUTURE) IMPLANT
SUTURE FIBERWR #2 38 T-5 BLUE (SUTURE) IMPLANT
SUTURE TAPE 1.3 40 TPR END (SUTURE) ×1 IMPLANT
SUTURE TAPE TIGERLINK 1.3MM BL (SUTURE) ×1 IMPLANT
SUTURETAPE 1.3 40 TPR END (SUTURE) ×2
SUTURETAPE TIGERLINK 1.3MM BL (SUTURE) ×2
SYR 5ML LL (SYRINGE) IMPLANT
TAPE FIBER 2MM 7IN #2 BLUE (SUTURE) IMPLANT
TOWEL GREEN STERILE FF (TOWEL DISPOSABLE) ×4 IMPLANT
TUBE CONNECTING 20X1/4 (TUBING) ×2 IMPLANT
TUBING ARTHROSCOPY IRRIG 16FT (MISCELLANEOUS) ×2 IMPLANT

## 2021-06-13 NOTE — Interval H&P Note (Signed)
History and Physical Interval Note:  06/13/2021 10:39 AM  Lee Ballard  has presented today for surgery, with the diagnosis of Right Shoulder Rotator Cuff Tear.  The various methods of treatment have been discussed with the patient and family. After consideration of risks, benefits and other options for treatment, the patient has consented to  Procedure(s): RIGHT SHOULDER ARTHROSCOPY WITH ROTATOR CUFF REPAIR (Right) as a surgical intervention.  The patient's history has been reviewed, patient examined, no change in status, stable for surgery.  I have reviewed the patient's chart and labs.  Questions were answered to the patient's satisfaction.     Vanetta Mulders

## 2021-06-13 NOTE — Progress Notes (Signed)
Assisted Dr. Ambrose Pancoast with right, ultrasound guided, interscalene  block. Side rails up, monitors on throughout procedure. See vital signs in flow sheet. Tolerated Procedure well.

## 2021-06-13 NOTE — Transfer of Care (Signed)
Immediate Anesthesia Transfer of Care Note  Patient: Lee Ballard  Procedure(s) Performed: RIGHT SHOULDER ARTHROSCOPY WITH ROTATOR CUFF REPAIR (Right: Shoulder)  Patient Location: PACU  Anesthesia Type:GA combined with regional for post-op pain  Level of Consciousness: awake, alert  and oriented  Airway & Oxygen Therapy: Patient Spontanous Breathing and Patient connected to face mask oxygen  Post-op Assessment: Report given to RN and Post -op Vital signs reviewed and stable  Post vital signs: Reviewed and stable  Last Vitals:  Vitals Value Taken Time  BP 131/79 06/13/21 1418  Temp    Pulse 99 06/13/21 1421  Resp 21 06/13/21 1421  SpO2 96 % 06/13/21 1421  Vitals shown include unvalidated device data.  Last Pain:  Vitals:   06/13/21 1036  TempSrc: Oral  PainSc: 0-No pain         Complications: No notable events documented.

## 2021-06-13 NOTE — Anesthesia Preprocedure Evaluation (Addendum)
Anesthesia Evaluation  Patient identified by MRN, date of birth, ID band Patient awake    Reviewed: Allergy & Precautions, H&P , NPO status , Patient's Chart, lab work & pertinent test results, reviewed documented beta blocker date and time   Airway Mallampati: II  TM Distance: >3 FB Neck ROM: full    Dental no notable dental hx. (+) Teeth Intact, Dental Advisory Given   Pulmonary neg pulmonary ROS,    Pulmonary exam normal breath sounds clear to auscultation       Cardiovascular Exercise Tolerance: Good hypertension, Pt. on medications  Rhythm:regular Rate:Normal     Neuro/Psych negative neurological ROS  negative psych ROS   GI/Hepatic Neg liver ROS, GERD  Medicated and Controlled,  Endo/Other  Morbid obesity  Renal/GU negative Renal ROS  negative genitourinary   Musculoskeletal   Abdominal   Peds  Hematology negative hematology ROS (+)   Anesthesia Other Findings   Reproductive/Obstetrics negative OB ROS                            Anesthesia Physical Anesthesia Plan  ASA: 3  Anesthesia Plan: General   Post-op Pain Management: GA combined w/ Regional for post-op pain   Induction: Intravenous  PONV Risk Score and Plan: 2 and Ondansetron, Dexamethasone and Midazolam  Airway Management Planned: Oral ETT and LMA  Additional Equipment: None  Intra-op Plan:   Post-operative Plan: Extubation in OR  Informed Consent: I have reviewed the patients History and Physical, chart, labs and discussed the procedure including the risks, benefits and alternatives for the proposed anesthesia with the patient or authorized representative who has indicated his/her understanding and acceptance.     Dental Advisory Given  Plan Discussed with: CRNA and Anesthesiologist  Anesthesia Plan Comments: (Discussed both nerve block for pain relief post-op and GA; including NV, sore throat, dental  injury, and pulmonary complications)        Anesthesia Quick Evaluation

## 2021-06-13 NOTE — Anesthesia Procedure Notes (Addendum)
Anesthesia Regional Block: Interscalene brachial plexus block   Pre-Anesthetic Checklist: , timeout performed,  Correct Patient, Correct Site, Correct Laterality,  Correct Procedure, Correct Position, site marked,  Risks and benefits discussed,  Surgical consent,  Pre-op evaluation,  At surgeon's request and post-op pain management  Laterality: Right  Prep: chloraprep       Needles:  Injection technique: Single-shot  Needle Type: Echogenic Stimulator Needle     Needle Length: 5cm  Needle Gauge: 22     Additional Needles:   Procedures:, nerve stimulator,,, ultrasound used (permanent image in chart),,     Nerve Stimulator or Paresthesia:  Response: quadraceps contraction, 0.45 mA  Additional Responses:   Narrative:  Start time: 06/13/2021 11:20 AM End time: 06/13/2021 11:27 AM Injection made incrementally with aspirations every 5 mL.  Performed by: Personally  Anesthesiologist: Janeece Riggers, MD  Additional Notes: Functioning IV was confirmed and monitors were applied.  A 53mm 22ga Arrow echogenic stimulator needle was used. Sterile prep and drape,hand hygiene and sterile gloves were used. Ultrasound guidance: relevant anatomy identified, needle position confirmed, local anesthetic spread visualized around nerve(s)., vascular puncture avoided.  Image printed for medical record. Negative aspiration and negative test dose prior to incremental administration of local anesthetic. The patient tolerated the procedure well.

## 2021-06-13 NOTE — Brief Op Note (Signed)
   Brief Op Note  Date of Surgery: 06/13/2021  Preoperative Diagnosis: Right Shoulder Rotator Cuff Tear  Postoperative Diagnosis: same  Procedure: Procedure(s): RIGHT SHOULDER ARTHROSCOPY WITH ROTATOR CUFF REPAIR  Implants: Implant Name Type Inv. Item Serial No. Manufacturer Lot No. LRB No. Used Action  Haywood Filler BIO [id number] - [id number] SWIVELOCK BIO [id number]  Rolinda Roan [id number] Right 2 Implanted    Surgeons: Surgeon(s): Vanetta Mulders, MD  Anesthesia: General    Estimated Blood Loss: See anesthesia record  Complications: None  Condition to PACU: Stable  Yevonne Pax, MD 06/13/2021 2:20 PM

## 2021-06-13 NOTE — Anesthesia Postprocedure Evaluation (Signed)
Anesthesia Post Note  Patient: Lee Ballard  Procedure(s) Performed: RIGHT SHOULDER ARTHROSCOPY WITH ROTATOR CUFF REPAIR (Right: Shoulder)     Patient location during evaluation: PACU Anesthesia Type: General and Regional Level of consciousness: awake and alert, oriented and patient cooperative Pain management: pain level controlled Vital Signs Assessment: post-procedure vital signs reviewed and stable Respiratory status: spontaneous breathing, nonlabored ventilation and respiratory function stable Cardiovascular status: blood pressure returned to baseline and stable Postop Assessment: no apparent nausea or vomiting Anesthetic complications: no   No notable events documented.  Last Vitals:  Vitals:   06/13/21 1418 06/13/21 1430  BP: 131/79 140/86  Pulse: 98 92  Resp: 19 18  Temp: 36.9 C   SpO2: 95% 92%    Last Pain:  Vitals:   06/13/21 1418  TempSrc:   PainSc: 0-No pain                 Pervis Hocking

## 2021-06-13 NOTE — Discharge Instructions (Addendum)
Discharge Instructions    Attending Surgeon: Vanetta Mulders, MD Office Phone Number: [phone]   Diagnosis and Procedures:    Surgeries Performed: Right shoulder subscabularis, supraspinatus tear  Discharge Plan:    Diet: Resume usual diet. Begin with light or bland foods.  Drink plenty of fluids.  Activity:  Keep sling and dressing in place until your follow up visit in Physical Therapy You are advised to go home directly from the hospital or surgical center. Restrict your activities.  GENERAL INSTRUCTIONS: 1.  Keep your surgical site elevated above your heart for at least 5-7 days or longer to prevent swelling. This will improve your comfort and your overall recovery following surgery.     2. Please call Dr. Eddie Dibbles office at [phone] with questions Monday-Friday during business hours. If no one answers, please leave a message and someone should get back to the patient within 24 hours. For emergencies please call 911 or proceed to the emergency room.   3. Patient to notify surgical team if experiences any of the following: Bowel/Bladder dysfunction, uncontrolled pain, nerve/muscle weakness, incision with increased drainage or redness, nausea/vomiting and Fever greater than 101.0 F.  Be alert for signs of infection including redness, streaking, odor, fever or chills. Be alert for excessive pain or bleeding and notify your surgeon immediately.  WOUND INSTRUCTIONS:   Leave your dressing/cast/splint in place until your post operative visit.  Keep it clean and dry.  Always keep the incision clean and dry until the staples/sutures are removed. If there is no drainage from the incision you should keep it open to air. If there is drainage from the incision you must keep it covered at all times until the drainage stops  Do not soak in a bath tub, hot tub, pool, lake or other body of water until 21 days after your surgery and your incision is completely dry and healed.  If  you have removable sutures (or staples) they must be removed 10-14 days (unless otherwise instructed) from the day of your surgery.     1)  Elevate the extremity as much as possible.  2)  Keep the dressing clean and dry.  3)  Please call us if the dressing becomes wet or dirty.  4)  If you are experiencing worsening pain or worsening swelling, please call.     MEDICATIONS: Resume all previous home medications at the previous prescribed dose and frequency unless otherwise noted Start taking the  pain medications on an as-needed basis as prescribed  Please taper down pain medication over the next week following surgery.  Ideally you should not require a refill of any narcotic pain medication.  Take pain medication with food to minimize nausea. In addition to the prescribed pain medication, you may take over-the-counter pain relievers such as Tylenol.  Do NOT take additional tylenol if your pain medication already has tylenol in it.  Aspirin 325mg  daily for four weeks.      FOLLOWUP INSTRUCTIONS: 1. Follow up at the Physical Therapy Clinic 3-4 days following surgery. This appointment should be scheduled unless other arrangements have been made.The Physical Therapy scheduling number is [phone] if an appointment has not already been arranged.  2. Contact Dr. Eddie Dibbles office during office hours at [phone] or the practice after hours line at [phone] for non-emergencies. For medical emergencies call 911.   Discharge Location: Home     Next dose of Tylenol can be given at 4:45pm if needed.   [facility]  Care Instructions  Activity: Get plenty of rest for the remainder of the day. A responsible individual must stay with you for 24 hours following the procedure.  For the next 24 hours, DO NOT: -Drive a car -Paediatric nurse -Drink alcoholic beverages -Take any medication unless instructed by your physician -Make any legal decisions or sign important  papers.  Meals: Start with liquid foods such as gelatin or soup. Progress to regular foods as tolerated. Avoid greasy, spicy, heavy foods. If nausea and/or vomiting occur, drink only clear liquids until the nausea and/or vomiting subsides. Call your physician if vomiting continues.  Special Instructions/Symptoms: Your throat may feel dry or sore from the anesthesia or the breathing tube placed in your throat during surgery. If this causes discomfort, gargle with warm salt water. The discomfort should disappear within 24 hours.  If you had a scopolamine patch placed behind your ear for the management of post- operative nausea and/or vomiting:  1. The medication in the patch is effective for 72 hours, after which it should be removed.  Wrap patch in a tissue and discard in the trash. Wash hands thoroughly with soap and water. 2. You may remove the patch earlier than 72 hours if you experience unpleasant side effects which may include dry mouth, dizziness or visual disturbances. 3. Avoid touching the patch. Wash your hands with soap and water after contact with the patch.  Regional Anesthesia Blocks  1. Numbness or the inability to move the "blocked" extremity may last from 3-48 hours after placement. The length of time depends on the medication injected and your individual response to the medication. If the numbness is not going away after 48 hours, call your surgeon.  2. The extremity that is blocked will need to be protected until the numbness is gone and the  Strength has returned. Because you cannot feel it, you will need to take extra care to avoid injury. Because it may be weak, you may have difficulty moving it or using it. You may not know what position it is in without looking at it while the block is in effect.  3. For blocks in the legs and feet, returning to weight bearing and walking needs to be done carefully. You will need to wait until the numbness is entirely gone and the strength  has returned. You should be able to move your leg and foot normally before you try and bear weight or walk. You will need someone to be with you when you first try to ensure you do not fall and possibly risk injury.  4. Bruising and tenderness at the needle site are common side effects and will resolve in a few days.  5. Persistent numbness or new problems with movement should be communicated to the surgeon or the [facility] [phone] [place] [phone]).   Information for Discharge Teaching: EXPAREL (bupivacaine liposome injectable suspension)   Your surgeon or anesthesiologist gave you EXPAREL(bupivacaine) to help control your pain after surgery.  EXPAREL is a local anesthetic that provides pain relief by numbing the tissue around the surgical site. EXPAREL is designed to release pain medication over time and can control pain for up to 72 hours. Depending on how you respond to EXPAREL, you may require less pain medication during your recovery.  Possible side effects: Temporary loss of sensation or ability to move in the area where bupivacaine was injected. Nausea, vomiting, constipation Rarely, numbness and tingling in your mouth or lips, lightheadedness, or anxiety  may occur. Call your doctor right away if you think you may be experiencing any of these sensations, or if you have other questions regarding possible side effects.  Follow all other discharge instructions given to you by your surgeon or nurse. Eat a healthy diet and drink plenty of water or other fluids.  If you return to the hospital for any reason within 96 hours following the administration of EXPAREL, it is important for health care providers to know that you have received this anesthetic. A teal colored band has been placed on your arm with the date, time and amount of EXPAREL you have received in order to alert and inform your health care providers. Please leave this armband in place  for the full 96 hours following administration, and then you may remove the band. Information for Discharge Teaching: EXPAREL (bupivacaine liposome injectable suspension)   Your surgeon or anesthesiologist gave you EXPAREL(bupivacaine) to help control your pain after surgery.  EXPAREL is a local anesthetic that provides pain relief by numbing the tissue around the surgical site. EXPAREL is designed to release pain medication over time and can control pain for up to 72 hours. Depending on how you respond to EXPAREL, you may require less pain medication during your recovery.  Possible side effects: Temporary loss of sensation or ability to move in the area where bupivacaine was injected. Nausea, vomiting, constipation Rarely, numbness and tingling in your mouth or lips, lightheadedness, or anxiety may occur. Call your doctor right away if you think you may be experiencing any of these sensations, or if you have other questions regarding possible side effects.  Follow all other discharge instructions given to you by your surgeon or nurse. Eat a healthy diet and drink plenty of water or other fluids.  If you return to the hospital for any reason within 96 hours following the administration of EXPAREL, it is important for health care providers to know that you have received this anesthetic. A teal colored band has been placed on your arm with the date, time and amount of EXPAREL you have received in order to alert and inform your health care providers. Please leave this armband in place for the full 96 hours following administration, and then you may remove the band.

## 2021-06-13 NOTE — Op Note (Signed)
Date of Surgery: 06/13/2021  INDICATIONS: Lee Ballard is a 55 y.o.-year-old male with subscapularis tear as well as supraspinatus rotator cuff tear.  The risk and benefits of the procedure with discussed in detail and documented in the pre-operative evaluation.  PREOPERATIVE DIAGNOSIS: 1.  Right subscapularis tear 2.  Right rotator cuff supraspinatus tear full-thickness small  POSTOPERATIVE DIAGNOSIS: Same.  PROCEDURE: 1.  Right subscapularis repair arthroscopic 2.  Right rotator cuff repair supraspinatus arthroscopic 3.  Subacromial decompression with acromioplasty right  SURGEON: Yevonne Pax MD  ASSISTANT: Shirley Friar, ATC; necessary for the timely completion of procedure and due to complexity of procedure.  ANESTHESIA:  general  IV FLUIDS AND URINE: See anesthesia record.  ANTIBIOTICS: Ancef 2g  ESTIMATED BLOOD LOSS: 10 mL.  IMPLANTS:  Implant Name Type Inv. Item Serial No. Manufacturer Lot No. LRB No. Used Action  Haywood Filler BIO [id number] - [id number] Anchor ANCHOR SWIVELOCK BIO [id number]  [place] [id number] Right 2 Implanted    DRAINS: None  CULTURES: None  COMPLICATIONS: none  DESCRIPTION OF PROCEDURE:   EUA: Examination under anesthesia revealed forward elevation of 150 degrees.  In abduction, there was 90 degrees of external rotation and 45 degrees of internal rotation.  With the arm at the side, there was 50 degrees of external rotation.  Arthroscopic findings demonstrated:  Glenoid cartilage: Intact Humeral head: Intact Labrum: Anterior as well as posterior fraying Biceps insertion: Intact without erythema or tearing Biceps tendon: Intact without erythema or tearing Subscapularis insertion: Cephalad border tear Rotator cuff: Full-thickness small rotator cuff tear Subacromial space: Significant subacromial bursitis, thickened CA ligament  I identified the patient in the pre-operative holding area.  I marked the operative right shoulder  with my initials. I reviewed the risks and benefits of the proposed surgical intervention and the patient (and/or patient's guardian) wished to proceed.  Anesthesia was then performed with regional block.  The patient was transferred to the operative suite and placed in the beach chair position with all bony prominences padded.     SCDs were placed on bilateral lower extremity. Appropriate antibiotics was administered within 1 hour before incision. Regional anesthesia was administered.The operative extremity was then prepped and draped in standard fashion. A time out was performed confirming the correct extremity, correct patient and correct procedure.  The arthroscope was introduced in the glenohumeral joint from a posterior portal.  An anterior portal was created.  The shoulder was examined and the above findings were noted.    With an arthroscopic shaver and an ArthroCare wand, synovitis throughout the  shoulder was resected and an arthroscopic debridement was performed of the anterior and posterior labral fraying.  The arthroscopic shaver was used to excise torn portions of the labrum back to a stable margin.   We visualize a tear in the superior border of the subscapularis tendon.  A shaver was then used to prepare the medial border of the lesser tuberosity for reinsertion.  The biceps was reapproximated to its native location.  The superior border was tagged with a fiber link stitch and then placed into its native insertion with a swivel lock.  This allowed [place] to really approximate the negative biceps anatomy.  Through visualization from intra-articular, the footprint of the rotator cuff was debrided of soft tissues along with osteophytes that had formed.  This was done with an arthroscopic shaver.  The footprint was abraded with the arthroscopic shaver to create a bleeding bony surface to encourage healing.   The rotator cuff  was then approached through the subacromial space.  Anterior,  anterolateral, posterolateral, and posterior portals were used.  Bursectomy was performed with an arthroscopic shaver and ArthroCare wand.  The soft tissues on the undersurface of the acromion was resected with the arthroscopic shaver and Arthrocare wand as well as a shaver on bur mode to perform a acromioplasty.  A medial release was performed and good excursion was noted of the tendon back to its footprint which would be amendable to an repair.  For the supraspinatus anterior margin tear, a #2 fiber tape was then used in a horizontal mattress fashion and passed with a scorpion suture passer.  We prepared the greater tuberosity with a shaver to create healthy bleeding bone.  Our suture limbs were then passed into this anchor.  The sutures were tensioned and the supraspinatus was reapproximated to its native anatomy.  The shoulder was irrigated.  The arthroscopic instruments were removed.  Wounds were closed with 3-0 nylon sutures.  A sterile dressing was applied with xeroform, 4x8s, abdominal pad, and tape. A polarcare was placed and the upper extremity was placed in a shoulder immobilizer.  The patient tolerated the procedure well and was taken to the recovery room in stable condition.    Shirley Friar ATC was necessary for opening, closing, retracting, limb positioning and overall facilitation and timely completion of the procedure.     POSTOPERATIVE PLAN: The patient will be nonweightbearing on the right upper extremity.  He will remain in the sling. He will follow up in 2 weeks for suture removal.  Yevonne Pax, MD 2:25 PM

## 2021-06-13 NOTE — Anesthesia Procedure Notes (Signed)
Procedure Name: Intubation Date/Time: 06/13/2021 12:15 PM Performed by: Lavonia Dana, CRNA Pre-anesthesia Checklist: Patient identified, Emergency Drugs available, Suction available and Patient being monitored Patient Re-evaluated:Patient Re-evaluated prior to induction Oxygen Delivery Method: Circle system utilized Preoxygenation: Pre-oxygenation with 100% oxygen Induction Type: IV induction Ventilation: Mask ventilation without difficulty and Oral airway inserted - appropriate to patient size Laryngoscope Size: Mac and 4 Grade View: Grade I Tube type: Oral Tube size: 7.5 mm Number of attempts: 1 Airway Equipment and Method: Stylet and Oral airway Placement Confirmation: ETT inserted through vocal cords under direct vision, positive ETCO2 and breath sounds checked- equal and bilateral Secured at: 24 cm Tube secured with: Tape Dental Injury: Teeth and Oropharynx as per pre-operative assessment

## 2021-06-14 ENCOUNTER — Telehealth: Payer: Self-pay

## 2021-06-14 NOTE — Telephone Encounter (Signed)
Patient called requesting Rx refill lisinopril (ZESTRIL) 30 MG tablet sent to [facility] pt will be out of meds end of week

## 2021-06-14 NOTE — Telephone Encounter (Signed)
Last refill for Lisinopril on 08/05/20-was stated on refill to contact Dr. Radford Pax for refills.  Last video visit with Dr. Sarajane Jews-  05/04/21 Last office visit in Cardiology with Dr. Radford Pax 09/28/2016. Patient has new patient office visit scheduled with Dr. Radford Pax on 08/04/21.  Is okay to send 90 day supply, with refills or without refills?   Please advise

## 2021-06-15 ENCOUNTER — Other Ambulatory Visit (HOSPITAL_BASED_OUTPATIENT_CLINIC_OR_DEPARTMENT_OTHER): Payer: Self-pay

## 2021-06-15 ENCOUNTER — Other Ambulatory Visit: Payer: Self-pay

## 2021-06-15 MED ORDER — LISINOPRIL 30 MG PO TABS
ORAL_TABLET | ORAL | 0 refills | Status: DC
  Filled 2021-06-15: qty 90, 90d supply, fill #0

## 2021-06-15 NOTE — Telephone Encounter (Signed)
Rx sent per Dr Sarajane Jews approval, pt was notified

## 2021-06-15 NOTE — Telephone Encounter (Signed)
Please refill for 90 days WITHOUT refills

## 2021-06-15 NOTE — Telephone Encounter (Signed)
Left detailed message for pt regarding refill for his Lisinopril

## 2021-06-16 ENCOUNTER — Encounter (HOSPITAL_BASED_OUTPATIENT_CLINIC_OR_DEPARTMENT_OTHER): Payer: Self-pay | Admitting: Physical Therapy

## 2021-06-16 ENCOUNTER — Other Ambulatory Visit: Payer: Self-pay

## 2021-06-16 ENCOUNTER — Ambulatory Visit (HOSPITAL_BASED_OUTPATIENT_CLINIC_OR_DEPARTMENT_OTHER): Payer: 59 | Attending: Orthopaedic Surgery | Admitting: Physical Therapy

## 2021-06-16 DIAGNOSIS — M25611 Stiffness of right shoulder, not elsewhere classified: Secondary | ICD-10-CM

## 2021-06-16 DIAGNOSIS — M6281 Muscle weakness (generalized): Secondary | ICD-10-CM

## 2021-06-16 DIAGNOSIS — S46011D Strain of muscle(s) and tendon(s) of the rotator cuff of right shoulder, subsequent encounter: Secondary | ICD-10-CM | POA: Insufficient documentation

## 2021-06-16 DIAGNOSIS — M25511 Pain in right shoulder: Secondary | ICD-10-CM

## 2021-06-16 DIAGNOSIS — R600 Localized edema: Secondary | ICD-10-CM

## 2021-06-16 NOTE — Therapy (Signed)
OUTPATIENT PHYSICAL THERAPY SHOULDER EVALUATION   Patient Name: Lee Ballard MRN: [id number] DOB:01/13/1966, 55 y.o., male Today's Date: 06/16/2021   PT End of Session - 06/16/21 1039     Visit Number 1    Number of Visits 21    Date for PT Re-Evaluation 09/14/21    Authorization Type U[employer]    PT Start Time 0845    PT Stop Time 0930    PT Time Calculation (min) 45 min    Activity Tolerance Patient tolerated treatment well;No increased pain    Behavior During Therapy [facility] for tasks assessed/performed             Past Medical History:  Diagnosis Date   Edema leg    Erectile dysfunction    Family history of premature CAD 09/28/2016   GERD (gastroesophageal reflux disease)    Hyperlipidemia    Hypertension    Varicose vein of leg    Past Surgical History:  Procedure Laterality Date   COLONOSCOPY  04/29/2015   per Dr. Henrene Pastor, hyperplastic polyps, repeat in 10 yrs    ENDOVENOUS ABLATION SAPHENOUS VEIN W/ LASER Right    TONSILLECTOMY     Patient Active Problem List   Diagnosis Date Noted   Nutritional counseling 08/20/2020   Superficial thrombophlebitis 01/15/2019   Family history of premature CAD 09/28/2016   Excessive daytime sleepiness 09/28/2016   Hyperglycemia 03/17/2014   Hyperlipidemia 10/08/2009   VARICOSE VEINS, LOWER EXTREMITIES 10/08/2009   Essential hypertension 11/12/2008   EDEMA LEG 11/12/2008    PCP: Laurey Morale, MD  REFERRING PROVIDER: Vanetta Mulders, MD  REFERRING DIAG: S46.011D (ICD-10-CM) - Traumatic complete tear of right rotator cuff, subsequent encounter   THERAPY DIAG:  Shoulder joint stiffness, right  Muscle weakness (generalized)  Acute pain of right shoulder  Localized edema   ONSET DATE: 06/13/2021- Surgery  SUBJECTIVE:                                                                                                                                                                                      SUBJECTIVE  STATEMENT: Pt states the original injury happened when he tripped over his dog. He tried catching him with his R arm and caused the injury. He states did feel a heavy "twinge" in the shoulder after. Pt states since the sx, the pain has been well managed. He feels very little pain in the shoulder. Pt states his partner Manuela Schwartz) helps him with dressing. Bathing also requires assistance from partner. Pt states he sleeping in the recliner and keeping the sling on. He has been icing consistently with the ice machine at home. Pt denies signs of infection  or systemic symptoms. Pt reports being compliant with shoulder precautions with no WB and no AROM.     PAIN:  Are you having pain? No   PRECAUTIONS: Shoulder  WEIGHT BEARING RESTRICTIONS Yes R RTC repair   FALLS:  Has patient fallen in last 6 months? No Number of falls: 0 (no falls to ground, just stumble causing R shoulder injury)  LIVING ENVIRONMENT: Lives with: lives with their family Lives in: House/apartment Stairs: Yes;  Has following equipment at home: None  PLOF: Independent  PATIENT GOALS Pt states that he would like to recover from the injury but gain full ROM and strength in that shoulder. Pt enjoys driving/cars. Pt states he would like to get more active.   OBJECTIVE:   Pt incisions with sutures and covering still in place. Large cover bandage removed and Band-aids covering replaced. Pt given instructions about removal of adhesive from skin and expectations and signs with incision sites. Additional bandages provided to caregiver.  DIAGNOSTIC FINDINGS:  MR  IMPRESSION: 1. Severe tendinosis of the supraspinatus tendon with a small full-thickness tear of the anterior supraspinatus tendon.   2. Severe tendinosis of the infraspinatus tendon.  PATIENT SURVEYS:  SPADI  62 / 80 = 77.5 %  COGNITION:  Overall cognitive status: Within functional limits for tasks assessed     SENSATION:  Light touch: Appears intact Cap refill  appears normal Radial pulse present and normal  POSTURE: Rounded/fwd shoulders Presents with R arm in sling   UPPER EXTREMITY AROM/PROM:  A/PROM Right 06/16/2021 Left 06/16/2021  Elbow flexion WFL WFL throughout  Elbow extension WFL   Wrist flexion WFL   Wrist extension WFL   Wrist ulnar deviation WFL   Wrist radial deviation WFL   Wrist pronation WFL   Wrist supination WFL   (Blank rows = not tested)  R shoulder AROM untested due to surgical precautions   R shoulder PROM: 70 deg flexion before guarding/spasm, ABD 65 deg until first resistance  UPPER EXTREMITY MMT:  MMT Right 06/16/2021 Left 06/16/2021  Grip strength 60, 61, 60 lbs 61, 62, 70 lbs  (Blank rows = not tested)  R shoulder untested due to surgical precautions  SHOULDER SPECIAL TESTS:  N/A  JOINT MOBILITY TESTING:   N/A  PALPATION:  Mild localized edema, no TTP, incisions clean dry with no drainage, steri-strips in place     TODAY'S TREATMENT:   Exercises Circular Shoulder Pendulum with Table Support - 3 x daily - 7 x weekly - 1 sets - 20 reps Flexion-Extension Shoulder Pendulum with Table Support - 3 x daily - 7 x weekly - 1 sets - 20 reps Horizontal Shoulder Pendulum with Table Support - 3 x daily - 7 x weekly - 1 sets - 20 reps Seated Elbow Flexion and Extension AROM - 3 x daily - 7 x weekly - 1 sets - 10 reps Wrist AROM Flexion Extension - 3 x daily - 7 x weekly - 1 sets - 10 reps    PATIENT EDUCATION: Education details: MOI, diagnosis, prognosis, anatomy, exercise progression, surgical precautions, bandaging, MD protocol, muscle firing,  envelope of function, HEP, POC  Person educated: Patient and Caregiver Education method: Explanation, Demonstration, Tactile cues, Verbal cues, and Handouts Education comprehension: verbalized understanding, returned demonstration, verbal cues required, and tactile cues required   HOME EXERCISE PROGRAM: Access Code: [email or web address] URL:  [email or web address] Date: 06/16/2021 Prepared by: Daleen Bo  ASSESSMENT:  CLINICAL IMPRESSION: Patient is a 55 y.o. male who was seen today for physical  therapy evaluation and treatment for status post R supraspinatus/RTC repair. Pt's s/s appear consistent with expected presentation 3 days post op. Pain and swelling is well controlled with no signs of infection noted. Objective impairments include decreased ROM, decreased strength, hypomobility, increased edema, increased fascial restrictions, increased muscle spasms, impaired flexibility, impaired UE functional use, improper body mechanics, postural dysfunction, and pain. These impairments are limiting patient from cleaning, community activity, driving, meal prep, occupation, laundry, yard work, and shopping. Personal factors including Age and Fitness are also affecting patient's functional outcome. Patient will benefit from skilled PT to address above impairments and improve overall function.  REHAB POTENTIAL: Good  CLINICAL DECISION MAKING: Stable/uncomplicated  EVALUATION COMPLEXITY: Low   GOALS:   SHORT TERM GOALS:  STG Name Target Date Goal status  1 Pt will become independent with HEP in order to demonstrate synthesis of PT education.  06/30/2021 INITIAL  2 Pt will demonstrate at least a 15 pt improvement in SPADI in order to demonstrate a clinically significant change in shoulder pain and function   07/28/2021 INITIAL  3 Pt will be able to demonstrate R UE PROM to 140 flexion, 40 ER, and 80  ABD in order to demonstrate functional improvement in UE ROM for transition to muscle activation/strength phase of protocol.   07/28/2021 INITIAL  4 Pt will be able to demonstrate full PROM in all planes in order to demonstrate functional improvement in UE ROM for progression to next phase.   07/28/2021 INITIAL   LONG TERM GOALS:   LTG Name Target Date Goal status  1 Pt  will become independent with final HEP in order to  demonstrate synthesis of PT education.  09/08/2021 INITIAL  2 Pt will be able to demonstrate to perform full AROM in all planes in order to demonstrate functional improvement in UE/LE function for self-care and house hold duties.   09/08/2021 INITIAL  3 Pt will be able to reach [facility] and carry/hold >3 lbs in order to demonstrate functional improvement in R UE strength for return to ADL and basic household duties.   09/08/2021 INITIAL  4 Pt will be able to demonstrate ability to carry 5-10lbs in order to demonstrate functional improvement in UE function for self-care and house hold duties.   09/08/2021 INITIAL   PLAN: PT FREQUENCY: 2x/week (possibly 3x initially to progress PROM)  PT DURATION: 12 weeks  PLANNED INTERVENTIONS: Therapeutic exercises, Therapeutic activity, Neuro Muscular re-education, Patient/Family education, Joint mobilization, Aquatic Therapy, Dry Needling, Electrical stimulation, Spinal mobilization, Cryotherapy, Moist heat, scar mobilization, Taping, Vasopneumatic device, Traction, Ultrasound, Ionotophoresis 4mg /ml Dexamethasone, and Manual therapy  PLAN FOR NEXT SESSION: PROM, review of HEP   Daleen Bo PT, DPT 06/16/21 1:20 PM

## 2021-06-20 ENCOUNTER — Ambulatory Visit (HOSPITAL_BASED_OUTPATIENT_CLINIC_OR_DEPARTMENT_OTHER): Payer: 59 | Admitting: Physical Therapy

## 2021-06-20 ENCOUNTER — Encounter (HOSPITAL_BASED_OUTPATIENT_CLINIC_OR_DEPARTMENT_OTHER): Payer: Self-pay | Admitting: Physical Therapy

## 2021-06-20 ENCOUNTER — Other Ambulatory Visit: Payer: Self-pay

## 2021-06-20 DIAGNOSIS — M25511 Pain in right shoulder: Secondary | ICD-10-CM

## 2021-06-20 DIAGNOSIS — M25611 Stiffness of right shoulder, not elsewhere classified: Secondary | ICD-10-CM

## 2021-06-20 DIAGNOSIS — S46011D Strain of muscle(s) and tendon(s) of the rotator cuff of right shoulder, subsequent encounter: Secondary | ICD-10-CM | POA: Diagnosis not present

## 2021-06-20 DIAGNOSIS — M6281 Muscle weakness (generalized): Secondary | ICD-10-CM

## 2021-06-20 NOTE — Therapy (Signed)
OUTPATIENT PHYSICAL THERAPY TREATMENT NOTE   Patient Name: Lee Ballard MRN: [id number] DOB:05/03/1966, 55 y.o., male Today's Date: 06/20/2021  PCP: Laurey Morale, MD REFERRING PROVIDER: Laurey Morale, MD   PT End of Session - 06/20/21 [phone]     Visit Number 2    Number of Visits 21    Date for PT Re-Evaluation 09/14/21    Authorization Type U[employer]    PT Start Time 0946    PT Stop Time 1021    PT Time Calculation (min) 35 min    Activity Tolerance Patient tolerated treatment well;No increased pain    Behavior During Therapy [facility] for tasks assessed/performed             Past Medical History:  Diagnosis Date   Edema leg    Erectile dysfunction    Family history of premature CAD 09/28/2016   GERD (gastroesophageal reflux disease)    Hyperlipidemia    Hypertension    Varicose vein of leg    Past Surgical History:  Procedure Laterality Date   COLONOSCOPY  04/29/2015   per Dr. Henrene Pastor, hyperplastic polyps, repeat in 10 yrs    ENDOVENOUS ABLATION SAPHENOUS VEIN W/ LASER Right    TONSILLECTOMY     Patient Active Problem List   Diagnosis Date Noted   Nutritional counseling 08/20/2020   Superficial thrombophlebitis 01/15/2019   Family history of premature CAD 09/28/2016   Excessive daytime sleepiness 09/28/2016   Hyperglycemia 03/17/2014   Hyperlipidemia 10/08/2009   VARICOSE VEINS, LOWER EXTREMITIES 10/08/2009   Essential hypertension 11/12/2008   EDEMA LEG 11/12/2008   REFERRING PROVIDER: Vanetta Mulders, MD   REFERRING DIAG: S46.011D (ICD-10-CM) - Traumatic complete tear of right rotator cuff, subsequent encounter    THERAPY DIAG:  Shoulder joint stiffness, right   Muscle weakness (generalized)   Acute pain of right shoulder   Localized edema     ONSET DATE: 06/13/2021- Surgery   SUBJECTIVE:                                                                                                                                                                                        SUBJECTIVE STATEMENT: Pt states the original injury happened when he tripped over his dog. He tried catching him with his R arm and caused the injury.  Pt states he is doing well.  Pt is 1 week s/p R subscapularis and supraspinatus repair.  Pt reports no adverse effects after prior Rx.   Pt reports compliance with HEP.  Pt is sleeping in the recliner, and has difficulty with sleeping.  Pt is compliant with wearing sling.  Pt is sling dependent and  is limited with self care activities and all ADLs/IADLs.  He is using ice on R shoulder but not as frequently as he was.       PAIN:  Are you having pain? No     PRECAUTIONS: Shoulder, RCR on 06/13/2021   WEIGHT BEARING RESTRICTIONS Yes R RTC repair        OBJECTIVE:    OBSERVATION:  Pt has band-aids over portals.  Pt is in sling with abduction pillow.               TODAY'S TREATMENT:   -Reviewed response to prior Rx, current function, and HEP compliance. -Pt received flexion, scaption, and ER PROM of R shoulder in supine w/n MD RCR protocol ranges including precaution of subscapularis repair with ER not going past 0 deg. (subscapularis repair protocol)  -Adjusted sling to proper position -Reviewed and performed HEP. -Pt performed pendulums cw, ccw, and f/b x 20 reps each -wrist flexion and extension AROM with arm supported x 20 reps each -towel gripping x 20 reps with arm supported -seated elbow flexion and extension x 20 reps    PATIENT EDUCATION: Education details: surgical precautions, MD protocol, HEP, and proper sling position.  Instructed pt in using ice especially if he has increased pain or soreness after Rx. Person educated: Patient and spouse Education method: Explanation, Demonstration, Tactile cues, Verbal cues Education comprehension: verbalized understanding, returned demonstration, verbal cues required, and tactile cues required     HOME EXERCISE PROGRAM: Access Code: [email or web address] URL:  [email or web address] Date: 06/16/2021 Prepared by: Daleen Bo  Exercises Circular Shoulder Pendulum with Table Support - 3 x daily - 7 x weekly - 1 sets - 20 reps Flexion-Extension Shoulder Pendulum with Table Support - 3 x daily - 7 x weekly - 1 sets - 20 reps Horizontal Shoulder Pendulum with Table Support - 3 x daily - 7 x weekly - 1 sets - 20 reps Seated Elbow Flexion and Extension AROM - 3 x daily - 7 x weekly - 1 sets - 10 reps Wrist AROM Flexion Extension - 3 x daily - 7 x weekly - 1 sets - 10 reps    ASSESSMENT:   CLINICAL IMPRESSION: Pt is 1 week post op and doing well with PROM.  He demonstrates improved R shoulder PROM compared to initial eval and is progressing appropriately with PROM per protocol.  He tolerates PROM well.  Pt is compliant with sling usage, HEP, and surgical precautions.  Reviewed and performed HEP and Pt demonstrates good understanding.  Pt responded well to Rx having no pain and no c/o's after Rx.  Pt should benefit from cont skilled PT services per protocol to address ongoing goals and to restore PLOF.   Objective impairments include decreased ROM, decreased strength, hypomobility, increased edema, increased fascial restrictions, increased muscle spasms, impaired flexibility, impaired UE functional use, improper body mechanics, postural dysfunction, and pain. These impairments are limiting patient from cleaning, community activity, driving, meal prep, occupation, laundry, yard work, and shopping.       GOALS:     SHORT TERM GOALS:   STG Name Target Date Goal status  1 Pt will become independent with HEP in order to demonstrate synthesis of PT education.   06/30/2021 INITIAL  2 Pt will demonstrate at least a 15 pt improvement in SPADI in order to demonstrate a clinically significant change in shoulder pain and function     07/28/2021 INITIAL  3 Pt will be able to demonstrate R UE PROM to 140  flexion, 40 ER, and 80  ABD in order to  demonstrate functional improvement in UE ROM for transition to muscle activation/strength phase of protocol.    07/28/2021 INITIAL  4 Pt will be able to demonstrate full PROM in all planes in order to demonstrate functional improvement in UE ROM for progression to next phase.    07/28/2021 INITIAL    LONG TERM GOALS:    LTG Name Target Date Goal status  1 Pt  will become independent with final HEP in order to demonstrate synthesis of PT education.   09/08/2021 INITIAL  2 Pt will be able to demonstrate to perform full AROM in all planes in order to demonstrate functional improvement in UE/LE function for self-care and house hold duties.     09/08/2021 INITIAL  3 Pt will be able to reach [facility] and carry/hold >3 lbs in order to demonstrate functional improvement in R UE strength for return to ADL and basic household duties.     09/08/2021 INITIAL  4 Pt will be able to demonstrate ability to carry 5-10lbs in order to demonstrate functional improvement in UE function for self-care and house hold duties.     09/08/2021 INITIAL    PLAN:    PLANNED INTERVENTIONS: Therapeutic exercises, Therapeutic activity, Neuro Muscular re-education, Patient/Family education, Joint mobilization, Aquatic Therapy, Dry Needling, Electrical stimulation, Spinal mobilization, Cryotherapy, Moist heat, scar mobilization, Taping, Vasopneumatic device, Traction, Ultrasound, Ionotophoresis 4mg /ml Dexamethasone, and Manual therapy   PLAN FOR NEXT SESSION: Cont per MD Rotator Cuff repair protocol and also with regards/precaution to subscapularis protocol.  Perform/review HEP.  Ice as needed.       Selinda Michaels III PT, DPT 06/20/21 12:26 PM

## 2021-06-22 ENCOUNTER — Ambulatory Visit (HOSPITAL_BASED_OUTPATIENT_CLINIC_OR_DEPARTMENT_OTHER): Payer: 59 | Admitting: Physical Therapy

## 2021-06-22 ENCOUNTER — Encounter (HOSPITAL_BASED_OUTPATIENT_CLINIC_OR_DEPARTMENT_OTHER): Payer: Self-pay | Admitting: Physical Therapy

## 2021-06-22 ENCOUNTER — Other Ambulatory Visit: Payer: Self-pay

## 2021-06-22 DIAGNOSIS — M25611 Stiffness of right shoulder, not elsewhere classified: Secondary | ICD-10-CM

## 2021-06-22 DIAGNOSIS — R600 Localized edema: Secondary | ICD-10-CM

## 2021-06-22 DIAGNOSIS — M25511 Pain in right shoulder: Secondary | ICD-10-CM

## 2021-06-22 DIAGNOSIS — S46011D Strain of muscle(s) and tendon(s) of the rotator cuff of right shoulder, subsequent encounter: Secondary | ICD-10-CM | POA: Diagnosis not present

## 2021-06-22 DIAGNOSIS — M6281 Muscle weakness (generalized): Secondary | ICD-10-CM

## 2021-06-22 NOTE — Therapy (Signed)
OUTPATIENT PHYSICAL THERAPY TREATMENT NOTE   Patient Name: Lee Ballard MRN: [id number] DOB:1965-12-05, 55 y.o., male Today's Date: 06/22/2021  PCP: Laurey Morale, MD REFERRING PROVIDER: Laurey Morale, MD   PT End of Session - 06/22/21 1605     Visit Number 3    Number of Visits 21    Date for PT Re-Evaluation 09/14/21    Authorization Type [employer]    PT Start Time 1600    PT Stop Time 1640    PT Time Calculation (min) 40 min    Activity Tolerance Patient tolerated treatment well;No increased pain    Behavior During Therapy [facility] for tasks assessed/performed              Past Medical History:  Diagnosis Date   Edema leg    Erectile dysfunction    Family history of premature CAD 09/28/2016   GERD (gastroesophageal reflux disease)    Hyperlipidemia    Hypertension    Varicose vein of leg    Past Surgical History:  Procedure Laterality Date   COLONOSCOPY  04/29/2015   per Dr. Henrene Pastor, hyperplastic polyps, repeat in 10 yrs    ENDOVENOUS ABLATION SAPHENOUS VEIN W/ LASER Right    SHOULDER ARTHROSCOPY WITH ROTATOR CUFF REPAIR AND SUBACROMIAL DECOMPRESSION Right 06/13/2021   Procedure: RIGHT SHOULDER ARTHROSCOPY WITH ROTATOR CUFF REPAIR;  Surgeon: Vanetta Mulders, MD;  Location: [place];  Service: Orthopedics;  Laterality: Right;   TONSILLECTOMY     Patient Active Problem List   Diagnosis Date Noted   Nutritional counseling 08/20/2020   Superficial thrombophlebitis 01/15/2019   Family history of premature CAD 09/28/2016   Excessive daytime sleepiness 09/28/2016   Hyperglycemia 03/17/2014   Hyperlipidemia 10/08/2009   VARICOSE VEINS, LOWER EXTREMITIES 10/08/2009   Essential hypertension 11/12/2008   EDEMA LEG 11/12/2008   REFERRING PROVIDER: Vanetta Mulders, MD   REFERRING DIAG: S46.011D (ICD-10-CM) - Traumatic complete tear of right rotator cuff, subsequent encounter    THERAPY DIAG:  Shoulder joint stiffness, right   Muscle weakness  (generalized)   Acute pain of right shoulder   Localized edema     ONSET DATE: 06/13/2021- Surgery   SUBJECTIVE:                                                                                                                                                                                       SUBJECTIVE STATEMENT: Pt states everything is feeling good. He was not sore after last session and his still doing HEP.       PAIN:  Are you having pain? No     PRECAUTIONS: Shoulder, RCR on 06/13/2021  WEIGHT BEARING RESTRICTIONS Yes R RTC repair        OBJECTIVE:    OBSERVATION:  Pt presents in sling with abduction pillow.  PROM:  to 75 deg flexion ER to 0 deg from hand on belly (subscap repair) ABD 60 deg ABD   Today   -seated scap post rolls and retraction 10x each gentle -standing pendulum CW, CCW, ML, ant post 20x each - elbow AROM flex and ext 20x (standing, postural cuing given) -Wrist PRE 1lb 20x each (rad dev, flex, ext, sup, pro) -Gripping with towel/stress ball (discussed for home) -PROM in flexion, ABD, ER -gentle LAD with oscillation at 60 deg ABD             Previous TREATMENT:   -Reviewed response to prior Rx, current function, and HEP compliance. -Pt received flexion, scaption, and ER PROM of R shoulder in supine w/n MD RCR protocol ranges including precaution of subscapularis repair with ER not going past 0 deg. (subscapularis repair protocol)  -Adjusted sling to proper position -Reviewed and performed HEP. -Pt performed pendulums cw, ccw, and f/b x 20 reps each -wrist flexion and extension AROM with arm supported x 20 reps each -towel gripping x 20 reps with arm supported -seated elbow flexion and extension x 20 reps    PATIENT EDUCATION: Education details: surgical precautions, MD protocol, HEP, and proper sling position.  Instructed pt in using ice especially if he has increased pain or soreness after Rx. Person educated: Patient and  spouse Education method: Explanation, Demonstration, Tactile cues, Verbal cues Education comprehension: verbalized understanding, returned demonstration, verbal cues required, and tactile cues required     HOME EXERCISE PROGRAM: Access Code: [email or web address] URL: [email or web address] Date: 06/16/2021 Prepared by: Daleen Bo  Exercises Circular Shoulder Pendulum with Table Support - 3 x daily - 7 x weekly - 1 sets - 20 reps Flexion-Extension Shoulder Pendulum with Table Support - 3 x daily - 7 x weekly - 1 sets - 20 reps Horizontal Shoulder Pendulum with Table Support - 3 x daily - 7 x weekly - 1 sets - 20 reps Seated Elbow Flexion and Extension AROM - 3 x daily - 7 x weekly - 1 sets - 10 reps Wrist AROM Flexion Extension - 3 x daily - 7 x weekly - 1 sets - 10 reps    ASSESSMENT:   CLINICAL IMPRESSION: Pt tolerated treatment session well with minimal pain/discomfort during session. Pt able to perform gentle scapular mobility exercise without pain and able to add resistance to wrist PRE without activation of R GHJ. Pt requires significant cuing during PROM for relaxation for prevention of AAROM. Pt with active spasm and guarding during PROM into flexion but no complaints of pain noted during. Pt with good understanding of HEP updates and continues to progress well with PT. Pt would benefit from continued skilled therapy in order to reach goals and maximize functional R UE strength and ROM for full return to PLOF.   Objective impairments include decreased ROM, decreased strength, hypomobility, increased edema, increased fascial restrictions, increased muscle spasms, impaired flexibility, impaired UE functional use, improper body mechanics, postural dysfunction, and pain. These impairments are limiting patient from cleaning, community activity, driving, meal prep, occupation, laundry, yard work, and shopping.       GOALS:     SHORT TERM GOALS:   STG Name Target Date Goal status   1 Pt will become independent with HEP in order to demonstrate synthesis of PT education.   06/30/2021 INITIAL  2  Pt will demonstrate at least a 15 pt improvement in SPADI in order to demonstrate a clinically significant change in shoulder pain and function     07/28/2021 INITIAL  3 Pt will be able to demonstrate R UE PROM to 140 flexion, 40 ER, and 80  ABD in order to demonstrate functional improvement in UE ROM for transition to muscle activation/strength phase of protocol.    07/28/2021 INITIAL  4 Pt will be able to demonstrate full PROM in all planes in order to demonstrate functional improvement in UE ROM for progression to next phase.    07/28/2021 INITIAL    LONG TERM GOALS:    LTG Name Target Date Goal status  1 Pt  will become independent with final HEP in order to demonstrate synthesis of PT education.   09/08/2021 INITIAL  2 Pt will be able to demonstrate to perform full AROM in all planes in order to demonstrate functional improvement in UE/LE function for self-care and house hold duties.     09/08/2021 INITIAL  3 Pt will be able to reach [facility] and carry/hold >3 lbs in order to demonstrate functional improvement in R UE strength for return to ADL and basic household duties.     09/08/2021 INITIAL  4 Pt will be able to demonstrate ability to carry 5-10lbs in order to demonstrate functional improvement in UE function for self-care and house hold duties.     09/08/2021 INITIAL    PLAN:    PLANNED INTERVENTIONS: Therapeutic exercises, Therapeutic activity, Neuro Muscular re-education, Patient/Family education, Joint mobilization, Aquatic Therapy, Dry Needling, Electrical stimulation, Spinal mobilization, Cryotherapy, Moist heat, scar mobilization, Taping, Vasopneumatic device, Traction, Ultrasound, Ionotophoresis 4mg /ml Dexamethasone, and Manual therapy   PLAN FOR NEXT SESSION: Cont per MD Rotator Cuff repair protocol and also with regards/precaution to subscapularis protocol. Review scap  retraction exercise     Daleen Bo PT, DPT 06/22/21 5:47 PM

## 2021-06-27 ENCOUNTER — Other Ambulatory Visit: Payer: Self-pay

## 2021-06-27 ENCOUNTER — Ambulatory Visit (INDEPENDENT_AMBULATORY_CARE_PROVIDER_SITE_OTHER): Payer: 59 | Admitting: Orthopaedic Surgery

## 2021-06-27 DIAGNOSIS — S46011D Strain of muscle(s) and tendon(s) of the rotator cuff of right shoulder, subsequent encounter: Secondary | ICD-10-CM

## 2021-06-27 NOTE — Progress Notes (Signed)
Post Operative Evaluation    Procedure/Date of Surgery: 06/13/21  Interval History:   Lee Ballard presents today 2-week follow-up after a right subcu scab repair as well as supraspinatus repair.  He is overall doing extremely well.  He has no pain while at rest.  He is discontinuing oxycodone medication.  He is taking his aspirin.  He is occasionally taking ibuprofen.  He has been working with physical therapy twice per week   PMH/PSH/Family History/Social History/Meds/Allergies:    Past Medical History:  Diagnosis Date   Edema leg    Erectile dysfunction    Family history of premature CAD 09/28/2016   GERD (gastroesophageal reflux disease)    Hyperlipidemia    Hypertension    Varicose vein of leg    Past Surgical History:  Procedure Laterality Date   COLONOSCOPY  04/29/2015   per Dr. Henrene Pastor, hyperplastic polyps, repeat in 10 yrs    ENDOVENOUS ABLATION SAPHENOUS VEIN W/ LASER Right    SHOULDER ARTHROSCOPY WITH ROTATOR CUFF REPAIR AND SUBACROMIAL DECOMPRESSION Right 06/13/2021   Procedure: RIGHT SHOULDER ARTHROSCOPY WITH ROTATOR CUFF REPAIR;  Surgeon: Vanetta Mulders, MD;  Location: [place];  Service: Orthopedics;  Laterality: Right;   TONSILLECTOMY     Social History   Socioeconomic History   Marital status: Divorced    Spouse name: Not on file   Number of children: Not on file   Years of education: Not on file   Highest education level: Not on file  Occupational History   Not on file  Tobacco Use   Smoking status: Never   Smokeless tobacco: Never  Substance and Sexual Activity   Alcohol use: No    Alcohol/week: 0.0 standard drinks   Drug use: No   Sexual activity: Not on file  Other Topics Concern   Not on file  Social History Narrative   Not on file   Social Determinants of Health   Financial Resource Strain: Not on file  Food Insecurity: Not on file  Transportation Needs: Not on file  Physical Activity: Not on  file  Stress: Not on file  Social Connections: Not on file   Family History  Problem Relation Age of Onset   Cancer Mother    Heart attack Father 39   Colon cancer Neg Hx    No Known Allergies Current Outpatient Medications  Medication Sig Dispense Refill   aspirin EC 325 MG tablet Take 1 tablet (325 mg total) by mouth daily. 30 tablet 0   aspirin EC 81 MG tablet Take 1 tablet (81 mg total) by mouth daily. 90 tablet 3   Calcium 200 MG TABS Take 400 mg by mouth daily.     ibuprofen (ADVIL,MOTRIN) 200 MG tablet Take 200 mg by mouth every 6 (six) hours as needed.     lisinopril (ZESTRIL) 30 MG tablet TAKE 1 TABLET BY MOUTH DAILY. (PLEASE MAKE YEARLY APPT WITH DR. Radford Pax FOR JAN FOR FUTURE REFILLS) 90 tablet 0   Multiple Vitamins-Minerals (MULTIVITAMIN,TX-MINERALS) tablet Take 1 tablet by mouth daily.       Omega-3 Fatty Acids (FISH OIL) 1200 MG CAPS Take 1,200 mg by mouth.     omeprazole (PRILOSEC) 40 MG capsule Take 1 capsule (40 mg total) by mouth in the morning and at bedtime. 180 capsule 3   oxyCODONE (OXY  IR/ROXICODONE) 5 MG immediate release tablet Take 1 tablet (5 mg total) by mouth every 4 (four) hours as needed (severe pain). 20 tablet 0   POTASSIUM PO Take 400 mg by mouth daily.     simvastatin (ZOCOR) 40 MG tablet TAKE 1 TABLET BY MOUTH EVERYDAY AT BEDTIME 90 tablet 0   vardenafil (LEVITRA) 20 MG tablet Take 1 tablet (20 mg total) by mouth as needed for erectile dysfunction. 3 tablet 11   No current facility-administered medications for this visit.   No results found.  Review of Systems:   A ROS was performed including pertinent positives and negatives as documented in the HPI.   Musculoskeletal Exam:    There were no vitals taken for this visit.  Right shoulder incisions are well-healed.  Is able to flex and extend at the elbow as well as the wrist.  Sensation is intact in all distributions of the hand.  2+ radial pulse  Imaging:    None  I personally reviewed and  interpreted the radiographs.   Assessment:   55 year old male 2-week status post right supraspinatus as well as subscapularis repair overall doing very well.  He will continue to work on physical therapy for passive range of motion.  We will advance him to active range of motion at 6 weeks.  Plan :    -Return to clinic in 4 weeks -Work note provided    I personally saw and evaluated the patient, and participated in the management and treatment plan.  Vanetta Mulders, MD Attending Physician, Orthopedic Surgery  This document was dictated using Dragon voice recognition software. A reasonable attempt at proof reading has been made to minimize errors.

## 2021-06-28 ENCOUNTER — Encounter (HOSPITAL_BASED_OUTPATIENT_CLINIC_OR_DEPARTMENT_OTHER): Payer: 59 | Attending: Physical Therapy | Admitting: Physical Therapy

## 2021-06-28 ENCOUNTER — Encounter (HOSPITAL_BASED_OUTPATIENT_CLINIC_OR_DEPARTMENT_OTHER): Payer: Self-pay | Admitting: Physical Therapy

## 2021-06-28 DIAGNOSIS — M25611 Stiffness of right shoulder, not elsewhere classified: Secondary | ICD-10-CM | POA: Diagnosis not present

## 2021-06-28 DIAGNOSIS — R600 Localized edema: Secondary | ICD-10-CM | POA: Diagnosis present

## 2021-06-28 DIAGNOSIS — M6281 Muscle weakness (generalized): Secondary | ICD-10-CM | POA: Diagnosis present

## 2021-06-28 DIAGNOSIS — M25511 Pain in right shoulder: Secondary | ICD-10-CM | POA: Insufficient documentation

## 2021-06-28 NOTE — Therapy (Signed)
OUTPATIENT PHYSICAL THERAPY TREATMENT NOTE   Patient Name: Lee Ballard MRN: [id number] DOB:December 30, 1965, 55 y.o., male Today's Date: 06/28/2021  PCP: Laurey Morale, MD REFERRING PROVIDER: Laurey Morale, MD   PT End of Session - 06/28/21 1646     Visit Number 4    Number of Visits 21    Date for PT Re-Evaluation 09/14/21    Authorization Type UHC    PT Start Time 2595    PT Stop Time 6387    PT Time Calculation (min) 30 min    Activity Tolerance Patient tolerated treatment well;No increased pain    Behavior During Therapy [facility] for tasks assessed/performed              Past Medical History:  Diagnosis Date   Edema leg    Erectile dysfunction    Family history of premature CAD 09/28/2016   GERD (gastroesophageal reflux disease)    Hyperlipidemia    Hypertension    Varicose vein of leg    Past Surgical History:  Procedure Laterality Date   COLONOSCOPY  04/29/2015   per Dr. Henrene Pastor, hyperplastic polyps, repeat in 10 yrs    ENDOVENOUS ABLATION SAPHENOUS VEIN W/ LASER Right    SHOULDER ARTHROSCOPY WITH ROTATOR CUFF REPAIR AND SUBACROMIAL DECOMPRESSION Right 06/13/2021   Procedure: RIGHT SHOULDER ARTHROSCOPY WITH ROTATOR CUFF REPAIR;  Surgeon: Vanetta Mulders, MD;  Location: [place];  Service: Orthopedics;  Laterality: Right;   TONSILLECTOMY     Patient Active Problem List   Diagnosis Date Noted   Nutritional counseling 08/20/2020   Superficial thrombophlebitis 01/15/2019   Family history of premature CAD 09/28/2016   Excessive daytime sleepiness 09/28/2016   Hyperglycemia 03/17/2014   Hyperlipidemia 10/08/2009   VARICOSE VEINS, LOWER EXTREMITIES 10/08/2009   Essential hypertension 11/12/2008   EDEMA LEG 11/12/2008   REFERRING PROVIDER: Vanetta Mulders, MD   REFERRING DIAG: S46.011D (ICD-10-CM) - Traumatic complete tear of right rotator cuff, subsequent encounter    THERAPY DIAG:  Shoulder joint stiffness, right   Muscle weakness  (generalized)   Acute pain of right shoulder   Localized edema     ONSET DATE: 06/13/2021- Surgery   SUBJECTIVE:                                                                                                                                                                                       SUBJECTIVE STATEMENT: Pt states everything is still doing well. No pain with HEP. He states only 15 mins of pain after sleeping in a "funny" position on the recliner.      PAIN:  Are you having pain? No  PRECAUTIONS: Shoulder, RCR on 06/13/2021   WEIGHT BEARING RESTRICTIONS Yes R RTC repair        OBJECTIVE:  Goals by week 4: 140 flexion, ABD 60-80   OBSERVATION:  Pt presents in sling with abduction pillow.  PROM:  to 90 deg flexion ER to 0 deg from hand on belly (subscap repair) ABD 70 deg ABD   10/25   -seated scap post  and ant rolls 20x each gentle -standing pendulum CW, CCW, ML, ant post 20x each - elbow AROM flex and ext 20x (standing, postural cuing given) -Wrist PRE 2lb 20x each (rad dev, flex, ext, sup, pro)  -PROM in flexion, ABD, ER -gentle LAD with oscillation at 70 deg ABD  Previous:   -seated scap post rolls and retraction 10x each gentle -standing pendulum CW, CCW, ML, ant post 20x each - elbow AROM flex and ext 20x (standing, postural cuing given) -Wrist PRE 1lb 20x each (rad dev, flex, ext, sup, pro) -Gripping with towel/stress ball (discussed for home) -PROM in flexion, ABD, ER -gentle LAD with oscillation at 60 deg ABD             Previous TREATMENT:   -Reviewed response to prior Rx, current function, and HEP compliance. -Pt received flexion, scaption, and ER PROM of R shoulder in supine w/n MD RCR protocol ranges including precaution of subscapularis repair with ER not going past 0 deg. (subscapularis repair protocol)  -Adjusted sling to proper position -Reviewed and performed HEP. -Pt performed pendulums cw, ccw, and f/b x 20 reps each -wrist  flexion and extension AROM with arm supported x 20 reps each -towel gripping x 20 reps with arm supported -seated elbow flexion and extension x 20 reps    PATIENT EDUCATION: Education details: surgical precautions, MD protocol, HEP, and proper sling position.  Instructed pt in using ice especially if he has increased pain or soreness after Rx. Person educated: Patient and spouse Education method: Explanation, Demonstration, Tactile cues, Verbal cues Education comprehension: verbalized understanding, returned demonstration, verbal cues required, and tactile cues required     HOME EXERCISE PROGRAM: Access Code: [email or web address] URL: [email or web address] Date: 06/16/2021 Prepared by: Daleen Bo  Exercises Circular Shoulder Pendulum with Table Support - 3 x daily - 7 x weekly - 1 sets - 20 reps Flexion-Extension Shoulder Pendulum with Table Support - 3 x daily - 7 x weekly - 1 sets - 20 reps Horizontal Shoulder Pendulum with Table Support - 3 x daily - 7 x weekly - 1 sets - 20 reps Seated Elbow Flexion and Extension AROM - 3 x daily - 7 x weekly - 1 sets - 10 reps Wrist AROM Flexion Extension - 3 x daily - 7 x weekly - 1 sets - 10 reps    ASSESSMENT:   CLINICAL IMPRESSION: Pt tolerated treatment session well with no discomfort during exercise or PROM. Pt able to continue scapular mobility exercise without pain and able to add resistance to wrist PRE without activation of R GHJ. Pt requires significant cuing during PROM for relaxation for prevention of AAROM. Pt improving PROM but still with spasm and guarding at end range. Pt doing very well with current stage of rehab. Continue to progress PROM as able. Pt would benefit from continued skilled therapy in order to reach goals and maximize functional R UE strength and ROM for full return to PLOF.   Objective impairments include decreased ROM, decreased strength, hypomobility, increased edema, increased fascial restrictions, increased  muscle spasms, impaired flexibility, impaired UE  functional use, improper body mechanics, postural dysfunction, and pain. These impairments are limiting patient from cleaning, community activity, driving, meal prep, occupation, laundry, yard work, and shopping.       GOALS:     SHORT TERM GOALS:   STG Name Target Date Goal status  1 Pt will become independent with HEP in order to demonstrate synthesis of PT education.   06/30/2021 INITIAL  2 Pt will demonstrate at least a 15 pt improvement in SPADI in order to demonstrate a clinically significant change in shoulder pain and function     07/28/2021 INITIAL  3 Pt will be able to demonstrate R UE PROM to 140 flexion, 40 ER, and 80  ABD in order to demonstrate functional improvement in UE ROM for transition to muscle activation/strength phase of protocol.    07/28/2021 INITIAL  4 Pt will be able to demonstrate full PROM in all planes in order to demonstrate functional improvement in UE ROM for progression to next phase.    07/28/2021 INITIAL    LONG TERM GOALS:    LTG Name Target Date Goal status  1 Pt  will become independent with final HEP in order to demonstrate synthesis of PT education.   09/08/2021 INITIAL  2 Pt will be able to demonstrate to perform full AROM in all planes in order to demonstrate functional improvement in UE/LE function for self-care and house hold duties.     09/08/2021 INITIAL  3 Pt will be able to reach [facility] and carry/hold >3 lbs in order to demonstrate functional improvement in R UE strength for return to ADL and basic household duties.     09/08/2021 INITIAL  4 Pt will be able to demonstrate ability to carry 5-10lbs in order to demonstrate functional improvement in UE function for self-care and house hold duties.     09/08/2021 INITIAL    PLAN:    PLANNED INTERVENTIONS: Therapeutic exercises, Therapeutic activity, Neuro Muscular re-education, Patient/Family education, Joint mobilization, Aquatic Therapy, Dry  Needling, Electrical stimulation, Spinal mobilization, Cryotherapy, Moist heat, scar mobilization, Taping, Vasopneumatic device, Traction, Ultrasound, Ionotophoresis 4mg /ml Dexamethasone, and Manual therapy   PLAN FOR NEXT SESSION: Cont per MD Rotator Cuff repair protocol and also with regards/precaution to subscapularis protocol.      Daleen Bo PT, DPT 06/28/21 5:23 PM

## 2021-06-30 ENCOUNTER — Ambulatory Visit (HOSPITAL_BASED_OUTPATIENT_CLINIC_OR_DEPARTMENT_OTHER): Payer: 59 | Admitting: Physical Therapy

## 2021-06-30 ENCOUNTER — Other Ambulatory Visit: Payer: Self-pay

## 2021-06-30 ENCOUNTER — Encounter (HOSPITAL_BASED_OUTPATIENT_CLINIC_OR_DEPARTMENT_OTHER): Payer: Self-pay | Admitting: Physical Therapy

## 2021-06-30 DIAGNOSIS — M6281 Muscle weakness (generalized): Secondary | ICD-10-CM

## 2021-06-30 DIAGNOSIS — R600 Localized edema: Secondary | ICD-10-CM

## 2021-06-30 DIAGNOSIS — M25611 Stiffness of right shoulder, not elsewhere classified: Secondary | ICD-10-CM

## 2021-06-30 DIAGNOSIS — M25511 Pain in right shoulder: Secondary | ICD-10-CM

## 2021-06-30 DIAGNOSIS — S46011D Strain of muscle(s) and tendon(s) of the rotator cuff of right shoulder, subsequent encounter: Secondary | ICD-10-CM | POA: Diagnosis not present

## 2021-06-30 NOTE — Therapy (Signed)
OUTPATIENT PHYSICAL THERAPY TREATMENT NOTE   Patient Name: Lee Ballard MRN: [id number] DOB:[birth year], 55 y.o., male Today's Date: 06/30/2021  PCP: Laurey Morale, MD REFERRING PROVIDER: Laurey Morale, MD   PT End of Session - 06/30/21 1735     Visit Number 5    Number of Visits 21    Date for PT Re-Evaluation 09/14/21    Authorization Type UHC    PT Start Time 4970    PT Stop Time 2637    PT Time Calculation (min) 30 min    Activity Tolerance Patient tolerated treatment well;No increased pain    Behavior During Therapy [facility] for tasks assessed/performed               Past Medical History:  Diagnosis Date   Edema leg    Erectile dysfunction    Family history of premature CAD 09/28/2016   GERD (gastroesophageal reflux disease)    Hyperlipidemia    Hypertension    Varicose vein of leg    Past Surgical History:  Procedure Laterality Date   COLONOSCOPY  04/29/2015   per Dr. Henrene Pastor, hyperplastic polyps, repeat in 10 yrs    ENDOVENOUS ABLATION SAPHENOUS VEIN W/ LASER Right    SHOULDER ARTHROSCOPY WITH ROTATOR CUFF REPAIR AND SUBACROMIAL DECOMPRESSION Right 06/13/2021   Procedure: RIGHT SHOULDER ARTHROSCOPY WITH ROTATOR CUFF REPAIR;  Surgeon: Vanetta Mulders, MD;  Location: [place];  Service: Orthopedics;  Laterality: Right;   TONSILLECTOMY     Patient Active Problem List   Diagnosis Date Noted   Nutritional counseling 08/20/2020   Superficial thrombophlebitis 01/15/2019   Family history of premature CAD 09/28/2016   Excessive daytime sleepiness 09/28/2016   Hyperglycemia 03/17/2014   Hyperlipidemia 10/08/2009   VARICOSE VEINS, LOWER EXTREMITIES 10/08/2009   Essential hypertension 11/12/2008   EDEMA LEG 11/12/2008   REFERRING PROVIDER: Vanetta Mulders, MD   REFERRING DIAG: S46.011D (ICD-10-CM) - Traumatic complete tear of right rotator cuff, subsequent encounter    THERAPY DIAG:  Shoulder joint stiffness, right   Muscle weakness  (generalized)   Acute pain of right shoulder   Localized edema     ONSET DATE: 06/13/2021- Surgery   SUBJECTIVE:                                                                                                                                                                                       SUBJECTIVE STATEMENT: Pt states he is doing well. There is still no pain at home with exercise.      PAIN:  Are you having pain? No     PRECAUTIONS: Shoulder, RCR on 06/13/2021   WEIGHT BEARING RESTRICTIONS  Yes R RTC repair        OBJECTIVE:  Goals by week 4: 140 flexion, ABD 60-80   OBSERVATION:  Pt presents in sling with abduction pillow.  PROM:  to 115 deg flexion ER to 0 deg from hand on belly (subscap repair) ABD 80 deg ABD  10/27  - UT/LS 30s 3x each side  -seated scap post  and ant rolls 20x each gentle -standing pendulum CW, CCW, ML, ant post 20x each - elbow AROM flex and ext 20x  -Wrist PRE 3lb 20x each (rad dev, flex, ext, sup, pro)  -PROM in flexion, ABD, ER -gentle LAD with oscillation at 80 deg ABD   10/25   -seated scap post  and ant rolls 20x each gentle -standing pendulum CW, CCW, ML, ant post 20x each - elbow AROM flex and ext 20x (standing, postural cuing given) -Wrist PRE 2lb 20x each (rad dev, flex, ext, sup, pro)  -PROM in flexion, ABD, ER -gentle LAD with oscillation at 70 deg ABD  Previous:   -seated scap post rolls and retraction 10x each gentle -standing pendulum CW, CCW, ML, ant post 20x each - elbow AROM flex and ext 20x (standing, postural cuing given) -Wrist PRE 1lb 20x each (rad dev, flex, ext, sup, pro) -Gripping with towel/stress ball (discussed for home) -PROM in flexion, ABD, ER -gentle LAD with oscillation at 60 deg ABD             Previous TREATMENT:   -Reviewed response to prior Rx, current function, and HEP compliance. -Pt received flexion, scaption, and ER PROM of R shoulder in supine w/n MD RCR protocol ranges including  precaution of subscapularis repair with ER not going past 0 deg. (subscapularis repair protocol)  -Adjusted sling to proper position -Reviewed and performed HEP. -Pt performed pendulums cw, ccw, and f/b x 20 reps each -wrist flexion and extension AROM with arm supported x 20 reps each -towel gripping x 20 reps with arm supported -seated elbow flexion and extension x 20 reps    PATIENT EDUCATION: Education details: surgical precautions, MD protocol, HEP, and proper sling position.  Instructed pt in using ice especially if he has increased pain or soreness after Rx. Person educated: Patient and spouse Education method: Explanation, Demonstration, Tactile cues, Verbal cues Education comprehension: verbalized understanding, returned demonstration, verbal cues required, and tactile cues required     HOME EXERCISE PROGRAM: Access Code: [email or web address] URL: [email or web address] Date: 06/16/2021 Prepared by: Daleen Bo  Exercises Circular Shoulder Pendulum with Table Support - 3 x daily - 7 x weekly - 1 sets - 20 reps Flexion-Extension Shoulder Pendulum with Table Support - 3 x daily - 7 x weekly - 1 sets - 20 reps Horizontal Shoulder Pendulum with Table Support - 3 x daily - 7 x weekly - 1 sets - 20 reps Seated Elbow Flexion and Extension AROM - 3 x daily - 7 x weekly - 1 sets - 10 reps Wrist AROM Flexion Extension - 3 x daily - 7 x weekly - 1 sets - 10 reps    ASSESSMENT:   CLINICAL IMPRESSION: Pt continues to perform well during therapy with no restrictions outside of spasm at end range. Pt with very well managed pain. No soft tissue restriction noted today. Continue to progress PROM as able. Pt likely able to drop to 1x/week until week 6/next phase of therapy. Pt would benefit from continued skilled therapy in order to reach goals and maximize functional R UE strength and ROM for full  return to PLOF.   Objective impairments include decreased ROM, decreased strength,  hypomobility, increased edema, increased fascial restrictions, increased muscle spasms, impaired flexibility, impaired UE functional use, improper body mechanics, postural dysfunction, and pain. These impairments are limiting patient from cleaning, community activity, driving, meal prep, occupation, laundry, yard work, and shopping.       GOALS:     SHORT TERM GOALS:   STG Name Target Date Goal status  1 Pt will become independent with HEP in order to demonstrate synthesis of PT education.   06/30/2021 INITIAL  2 Pt will demonstrate at least a 15 pt improvement in SPADI in order to demonstrate a clinically significant change in shoulder pain and function     07/28/2021 INITIAL  3 Pt will be able to demonstrate R UE PROM to 140 flexion, 40 ER, and 80  ABD in order to demonstrate functional improvement in UE ROM for transition to muscle activation/strength phase of protocol.    07/28/2021 INITIAL  4 Pt will be able to demonstrate full PROM in all planes in order to demonstrate functional improvement in UE ROM for progression to next phase.    07/28/2021 INITIAL    LONG TERM GOALS:    LTG Name Target Date Goal status  1 Pt  will become independent with final HEP in order to demonstrate synthesis of PT education.   09/08/2021 INITIAL  2 Pt will be able to demonstrate to perform full AROM in all planes in order to demonstrate functional improvement in UE/LE function for self-care and house hold duties.     09/08/2021 INITIAL  3 Pt will be able to reach [facility] and carry/hold >3 lbs in order to demonstrate functional improvement in R UE strength for return to ADL and basic household duties.     09/08/2021 INITIAL  4 Pt will be able to demonstrate ability to carry 5-10lbs in order to demonstrate functional improvement in UE function for self-care and house hold duties.     09/08/2021 INITIAL    PLAN:    PLANNED INTERVENTIONS: Therapeutic exercises, Therapeutic activity, Neuro Muscular re-education,  Patient/Family education, Joint mobilization, Aquatic Therapy, Dry Needling, Electrical stimulation, Spinal mobilization, Cryotherapy, Moist heat, scar mobilization, Taping, Vasopneumatic device, Traction, Ultrasound, Ionotophoresis 4mg /ml Dexamethasone, and Manual therapy   PLAN FOR NEXT SESSION: Cont per MD Rotator Cuff repair protocol and also with regards/precaution to subscapularis protocol.      Daleen Bo PT, DPT 06/30/21 5:36 PM

## 2021-07-05 ENCOUNTER — Ambulatory Visit (HOSPITAL_BASED_OUTPATIENT_CLINIC_OR_DEPARTMENT_OTHER): Payer: 59 | Attending: Orthopaedic Surgery | Admitting: Physical Therapy

## 2021-07-05 ENCOUNTER — Other Ambulatory Visit: Payer: Self-pay

## 2021-07-05 DIAGNOSIS — M25511 Pain in right shoulder: Secondary | ICD-10-CM | POA: Insufficient documentation

## 2021-07-05 DIAGNOSIS — R600 Localized edema: Secondary | ICD-10-CM | POA: Insufficient documentation

## 2021-07-05 DIAGNOSIS — M25611 Stiffness of right shoulder, not elsewhere classified: Secondary | ICD-10-CM | POA: Insufficient documentation

## 2021-07-05 DIAGNOSIS — M6281 Muscle weakness (generalized): Secondary | ICD-10-CM | POA: Diagnosis present

## 2021-07-05 NOTE — Therapy (Signed)
OUTPATIENT PHYSICAL THERAPY TREATMENT NOTE   Patient Name: Lee Ballard MRN: [id number] DOB:March 04, 1966, 55 y.o., male Today's Date: 07/05/2021  PCP: Laurey Morale, MD REFERRING PROVIDER: Laurey Morale, MD   PT End of Session - 07/05/21 1725     Visit Number 6    Number of Visits 21    Date for PT Re-Evaluation 09/14/21    Authorization Type [employer]    PT Start Time 0034    PT Stop Time 1720    PT Time Calculation (min) 30 min    Activity Tolerance Patient tolerated treatment well;No increased pain    Behavior During Therapy [facility] for tasks assessed/performed                Past Medical History:  Diagnosis Date   Edema leg    Erectile dysfunction    Family history of premature CAD 09/28/2016   GERD (gastroesophageal reflux disease)    Hyperlipidemia    Hypertension    Varicose vein of leg    Past Surgical History:  Procedure Laterality Date   COLONOSCOPY  04/29/2015   per Dr. Henrene Pastor, hyperplastic polyps, repeat in 10 yrs    ENDOVENOUS ABLATION SAPHENOUS VEIN W/ LASER Right    SHOULDER ARTHROSCOPY WITH ROTATOR CUFF REPAIR AND SUBACROMIAL DECOMPRESSION Right 06/13/2021   Procedure: RIGHT SHOULDER ARTHROSCOPY WITH ROTATOR CUFF REPAIR;  Surgeon: Vanetta Mulders, MD;  Location: [facility];  Service: Orthopedics;  Laterality: Right;   TONSILLECTOMY     Patient Active Problem List   Diagnosis Date Noted   Nutritional counseling 08/20/2020   Superficial thrombophlebitis 01/15/2019   Family history of premature CAD 09/28/2016   Excessive daytime sleepiness 09/28/2016   Hyperglycemia 03/17/2014   Hyperlipidemia 10/08/2009   VARICOSE VEINS, LOWER EXTREMITIES 10/08/2009   Essential hypertension 11/12/2008   EDEMA LEG 11/12/2008   REFERRING PROVIDER: Vanetta Mulders, MD   REFERRING DIAG: S46.011D (ICD-10-CM) - Traumatic complete tear of right rotator cuff, subsequent encounter    THERAPY DIAG:  Shoulder joint stiffness, right   Muscle weakness  (generalized)   Acute pain of right shoulder   Localized edema     ONSET DATE: 06/13/2021- Surgery   SUBJECTIVE:                                                                                                                                                                                       SUBJECTIVE STATEMENT: Pt states he is doing well still. He has had no pain at home. Postero-lateral arm will feel a "twinge" on occasion.      PAIN:  Are you having pain? No     PRECAUTIONS: Shoulder,  RCR on 06/13/2021   WEIGHT BEARING RESTRICTIONS Yes R RTC repair        OBJECTIVE:  Goals by week 4: 140 flexion, ABD 60-80   OBSERVATION:  Pt presents in sling with abduction pillow.  PROM:  to 130 deg flexion ER to 0 deg from hand on belly (subscap repair) ABD 80 deg ABD   11/1  - SCM stretch 30s 3x each side  -seated scap post  and ant rolls 20x each gentle -standing pendulum CW, CCW, ML, ant post 20x each - elbow AROM flex and ext 20x  -Wrist PRE 4lb 20x each (rad dev, flex, ext, sup, pro) R forewarm braced with L arm to ensure no shoulder activation, pillow under R UE for support  -PROM in flexion, ABD, ER -gentle LAD with oscillation at 80 deg ABD  10/27  - UT/LS 30s 3x each side  -seated scap post  and ant rolls 20x each gentle -standing pendulum CW, CCW, ML, ant post 20x each - elbow AROM flex and ext 20x  -Wrist PRE 3lb 20x each (rad dev, flex, ext, sup, pro)  -PROM in flexion, ABD, ER -gentle LAD with oscillation at 80 deg ABD              Previous TREATMENT:   -Reviewed response to prior Rx, current function, and HEP compliance. -Pt received flexion, scaption, and ER PROM of R shoulder in supine w/n MD RCR protocol ranges including precaution of subscapularis repair with ER not going past 0 deg. (subscapularis repair protocol)  -Adjusted sling to proper position -Reviewed and performed HEP. -Pt performed pendulums cw, ccw, and f/b x 20 reps each -wrist  flexion and extension AROM with arm supported x 20 reps each -towel gripping x 20 reps with arm supported -seated elbow flexion and extension x 20 reps    PATIENT EDUCATION: Education details: surgical precautions, MD protocol, HEP, and proper sling position.  Instructed pt in using ice especially if he has increased pain or soreness after Rx. Person educated: Patient and spouse Education method: Explanation, Demonstration, Tactile cues, Verbal cues Education comprehension: verbalized understanding, returned demonstration, verbal cues required, and tactile cues required     HOME EXERCISE PROGRAM: Access Code: [email or web address] URL: [email or web address] Date: 06/16/2021 Prepared by: Daleen Bo  Exercises Circular Shoulder Pendulum with Table Support - 3 x daily - 7 x weekly - 1 sets - 20 reps Flexion-Extension Shoulder Pendulum with Table Support - 3 x daily - 7 x weekly - 1 sets - 20 reps Horizontal Shoulder Pendulum with Table Support - 3 x daily - 7 x weekly - 1 sets - 20 reps Seated Elbow Flexion and Extension AROM - 3 x daily - 7 x weekly - 1 sets - 10 reps Wrist AROM Flexion Extension - 3 x daily - 7 x weekly - 1 sets - 10 reps    ASSESSMENT:   CLINICAL IMPRESSION: Pt able to reach to 130 deg of flexion today without pain or discomfort. Mild spasm and guarding at end range after repeated flexion but no discomfort noted. Pt able to reach 80 ABD and 0 deg of ER from hand on belly. Pt progressing very well with PT and able to drop down to 1x/week until 6 week mark until next phase of PT protocol. Continue to progress PROM as able. Pt would benefit from continued skilled therapy in order to reach goals and maximize functional R UE strength and ROM for full return to PLOF.   Objective impairments include decreased  ROM, decreased strength, hypomobility, increased edema, increased fascial restrictions, increased muscle spasms, impaired flexibility, impaired UE functional use,  improper body mechanics, postural dysfunction, and pain. These impairments are limiting patient from cleaning, community activity, driving, meal prep, occupation, laundry, yard work, and shopping.       GOALS:     SHORT TERM GOALS:   STG Name Target Date Goal status  1 Pt will become independent with HEP in order to demonstrate synthesis of PT education.   06/30/2021 INITIAL  2 Pt will demonstrate at least a 15 pt improvement in SPADI in order to demonstrate a clinically significant change in shoulder pain and function     07/28/2021 INITIAL  3 Pt will be able to demonstrate R UE PROM to 140 flexion, 40 ER, and 80  ABD in order to demonstrate functional improvement in UE ROM for transition to muscle activation/strength phase of protocol.    07/28/2021 INITIAL  4 Pt will be able to demonstrate full PROM in all planes in order to demonstrate functional improvement in UE ROM for progression to next phase.    07/28/2021 INITIAL    LONG TERM GOALS:    LTG Name Target Date Goal status  1 Pt  will become independent with final HEP in order to demonstrate synthesis of PT education.   09/08/2021 INITIAL  2 Pt will be able to demonstrate to perform full AROM in all planes in order to demonstrate functional improvement in UE/LE function for self-care and house hold duties.     09/08/2021 INITIAL  3 Pt will be able to reach [facility] and carry/hold >3 lbs in order to demonstrate functional improvement in R UE strength for return to ADL and basic household duties.     09/08/2021 INITIAL  4 Pt will be able to demonstrate ability to carry 5-10lbs in order to demonstrate functional improvement in UE function for self-care and house hold duties.     09/08/2021 INITIAL    PLAN:    PLANNED INTERVENTIONS: Therapeutic exercises, Therapeutic activity, Neuro Muscular re-education, Patient/Family education, Joint mobilization, Aquatic Therapy, Dry Needling, Electrical stimulation, Spinal mobilization, Cryotherapy,  Moist heat, scar mobilization, Taping, Vasopneumatic device, Traction, Ultrasound, Ionotophoresis 4mg /ml Dexamethasone, and Manual therapy   PLAN FOR NEXT SESSION: Cont per MD Rotator Cuff repair protocol and also with regards/precaution to subscapularis protocol.      Daleen Bo PT, DPT 07/05/21 5:26 PM

## 2021-07-06 DIAGNOSIS — M7551 Bursitis of right shoulder: Secondary | ICD-10-CM

## 2021-07-06 DIAGNOSIS — S46009A Unspecified injury of muscle(s) and tendon(s) of the rotator cuff of unspecified shoulder, initial encounter: Secondary | ICD-10-CM

## 2021-07-07 ENCOUNTER — Other Ambulatory Visit: Payer: Self-pay

## 2021-07-07 ENCOUNTER — Ambulatory Visit (HOSPITAL_BASED_OUTPATIENT_CLINIC_OR_DEPARTMENT_OTHER): Payer: 59 | Admitting: Physical Therapy

## 2021-07-07 ENCOUNTER — Encounter (HOSPITAL_BASED_OUTPATIENT_CLINIC_OR_DEPARTMENT_OTHER): Payer: Self-pay | Admitting: Physical Therapy

## 2021-07-07 DIAGNOSIS — M25511 Pain in right shoulder: Secondary | ICD-10-CM

## 2021-07-07 DIAGNOSIS — M25611 Stiffness of right shoulder, not elsewhere classified: Secondary | ICD-10-CM

## 2021-07-07 DIAGNOSIS — M6281 Muscle weakness (generalized): Secondary | ICD-10-CM

## 2021-07-07 NOTE — Therapy (Signed)
OUTPATIENT PHYSICAL THERAPY TREATMENT NOTE   Patient Name: Lee Ballard MRN: [id number] DOB:Apr 04, 1966, 55 y.o., male Today's Date: 07/07/2021  PCP: Laurey Morale, MD REFERRING PROVIDER: Laurey Morale, MD   PT End of Session - 07/07/21 1607     Visit Number 7    Number of Visits 21    Date for PT Re-Evaluation 09/14/21    Authorization Type [employer]    PT Start Time 4268    PT Stop Time 3419    PT Time Calculation (min) 33 min    Activity Tolerance Patient tolerated treatment well;No increased pain    Behavior During Therapy [facility] for tasks assessed/performed                Past Medical History:  Diagnosis Date   Edema leg    Erectile dysfunction    Family history of premature CAD 09/28/2016   GERD (gastroesophageal reflux disease)    Hyperlipidemia    Hypertension    Varicose vein of leg    Past Surgical History:  Procedure Laterality Date   COLONOSCOPY  04/29/2015   per Dr. Henrene Pastor, hyperplastic polyps, repeat in 10 yrs    ENDOVENOUS ABLATION SAPHENOUS VEIN W/ LASER Right    SHOULDER ARTHROSCOPY WITH ROTATOR CUFF REPAIR AND SUBACROMIAL DECOMPRESSION Right 06/13/2021   Procedure: RIGHT SHOULDER ARTHROSCOPY WITH ROTATOR CUFF REPAIR;  Surgeon: Vanetta Mulders, MD;  Location: [place];  Service: Orthopedics;  Laterality: Right;   TONSILLECTOMY     Patient Active Problem List   Diagnosis Date Noted   Rotator cuff injury, initial encounter    Subacromial bursitis of right shoulder joint    Nutritional counseling 08/20/2020   Superficial thrombophlebitis 01/15/2019   Family history of premature CAD 09/28/2016   Excessive daytime sleepiness 09/28/2016   Hyperglycemia 03/17/2014   Hyperlipidemia 10/08/2009   VARICOSE VEINS, LOWER EXTREMITIES 10/08/2009   Essential hypertension 11/12/2008   EDEMA LEG 11/12/2008   REFERRING PROVIDER: Vanetta Mulders, MD   REFERRING DIAG: S46.011D (ICD-10-CM) - Traumatic complete tear of right rotator cuff,  subsequent encounter    THERAPY DIAG:  Shoulder joint stiffness, right   Muscle weakness (generalized)   Acute pain of right shoulder   Localized edema     ONSET DATE: 06/13/2021- Surgery   SUBJECTIVE:                                                                                                                                                                                       SUBJECTIVE STATEMENT: Pt is 3 weeks and 3 days s/p s/p R subscapularis and supraspinatus repair.  Pt reports he iced his shoulder after prior  Rx.  He had some soreness after prior Rx though no increased pain.  Pt denies pain currently.  Pt reports compliance with HEP and has no pain with HEP.  Pt plans to return to work on Monday.     PAIN:  Are you having pain? No     PRECAUTIONS: Shoulder, RCR on 06/13/2021   WEIGHT BEARING RESTRICTIONS Yes R RTC repair        OBJECTIVE:  Goals by week 4: 140 flexion, ABD 60-80   OBSERVATION:  Pt presents in sling with abduction pillow.  PROM:  Flexion:  140  ER: 0 deg limited per protocol (subscap repair) ABD: 80 deg ABD    Therapeutic Exercise:  -Reviewed response to prior Rx, current function, and HEP compliance.  -Pt performed pendulums cw, ccw, and f/b x 20 reps each  -elbow flexion and extension AROM x 20 reps  Manual Therapy: -gentle distraction with oscillation, AP and inferior Jt mobs to R GH jt to improve pain, ROM, and to normalize arthrokinematics -Pt received R shoulder PROM per protocol ranges and pt tolerance in flexion, scaption, and ER including precaution of subscapularis repair with ER not going past 0 deg. (subscapularis repair protocol) -Assessed PROM        PATIENT EDUCATION: Education details: surgical precautions, MD protocol, HEP, and ROM findings.   Person educated: Patient and spouse Education method: Explanation, Demonstration, Verbal cues Education comprehension: verbalized understanding, returned demonstration, verbal  cues required     HOME EXERCISE PROGRAM: Access Code: [email or web address] URL: [email or web address] Date: 06/16/2021 Prepared by: Daleen Bo  Exercises Circular Shoulder Pendulum with Table Support - 3 x daily - 7 x weekly - 1 sets - 20 reps Flexion-Extension Shoulder Pendulum with Table Support - 3 x daily - 7 x weekly - 1 sets - 20 reps Horizontal Shoulder Pendulum with Table Support - 3 x daily - 7 x weekly - 1 sets - 20 reps Seated Elbow Flexion and Extension AROM - 3 x daily - 7 x weekly - 1 sets - 10 reps Wrist AROM Flexion Extension - 3 x daily - 7 x weekly - 1 sets - 10 reps    ASSESSMENT:   CLINICAL IMPRESSION: Pt is making excellent progress with R shoulder PROM.  He has reached max ranges in flexion, abd, and ER per protocol.  He tolerates PROM well without c/o's.  Due to pt progressing well, we plan to decrease frequency to 1x/week until 6 week mark until next phase of PT protocol.  Pt responded well to Rx having no pain after Rx.  He should benefit from continued skilled therapy in order to reach goals and maximize functional R UE strength and ROM for full return to PLOF.   Objective impairments include decreased ROM, decreased strength, hypomobility, increased edema, increased fascial restrictions, increased muscle spasms, impaired flexibility, impaired UE functional use, improper body mechanics, postural dysfunction, and pain. These impairments are limiting patient from cleaning, community activity, driving, meal prep, occupation, laundry, yard work, and shopping.       GOALS:     SHORT TERM GOALS:   STG Name Target Date Goal status  1 Pt will become independent with HEP in order to demonstrate synthesis of PT education.   06/30/2021 INITIAL  2 Pt will demonstrate at least a 15 pt improvement in SPADI in order to demonstrate a clinically significant change in shoulder pain and function     07/28/2021 INITIAL  3 Pt will be able to demonstrate R UE PROM  to 140  flexion, 40 ER, and 80  ABD in order to demonstrate functional improvement in UE ROM for transition to muscle activation/strength phase of protocol.    07/28/2021 INITIAL  4 Pt will be able to demonstrate full PROM in all planes in order to demonstrate functional improvement in UE ROM for progression to next phase.    07/28/2021 INITIAL    LONG TERM GOALS:    LTG Name Target Date Goal status  1 Pt  will become independent with final HEP in order to demonstrate synthesis of PT education.   09/08/2021 INITIAL  2 Pt will be able to demonstrate to perform full AROM in all planes in order to demonstrate functional improvement in UE/LE function for self-care and house hold duties.     09/08/2021 INITIAL  3 Pt will be able to reach [facility] and carry/hold >3 lbs in order to demonstrate functional improvement in R UE strength for return to ADL and basic household duties.     09/08/2021 INITIAL  4 Pt will be able to demonstrate ability to carry 5-10lbs in order to demonstrate functional improvement in UE function for self-care and house hold duties.     09/08/2021 INITIAL    PLAN:    PLANNED INTERVENTIONS: Therapeutic exercises, Therapeutic activity, Neuro Muscular re-education, Patient/Family education, Joint mobilization, Aquatic Therapy, Dry Needling, Electrical stimulation, Spinal mobilization, Cryotherapy, Moist heat, scar mobilization, Taping, Vasopneumatic device, Traction, Ultrasound, Ionotophoresis 4mg /ml Dexamethasone, and Manual therapy   PLAN FOR NEXT SESSION: Cont per MD Rotator Cuff repair protocol and also with regards/precaution to subscapularis protocol.  Cancel 1 appt next week and plan to see Pt 1x/wk due to his ROM findings meeting protocol ranges.      Selinda Michaels III PT, DPT 07/07/21 9:14 PM

## 2021-07-12 ENCOUNTER — Encounter (HOSPITAL_BASED_OUTPATIENT_CLINIC_OR_DEPARTMENT_OTHER): Payer: 59 | Admitting: Physical Therapy

## 2021-07-14 ENCOUNTER — Ambulatory Visit (HOSPITAL_BASED_OUTPATIENT_CLINIC_OR_DEPARTMENT_OTHER): Payer: 59 | Admitting: Physical Therapy

## 2021-07-14 ENCOUNTER — Other Ambulatory Visit: Payer: Self-pay

## 2021-07-14 DIAGNOSIS — M25611 Stiffness of right shoulder, not elsewhere classified: Secondary | ICD-10-CM | POA: Diagnosis not present

## 2021-07-14 DIAGNOSIS — M25511 Pain in right shoulder: Secondary | ICD-10-CM

## 2021-07-14 DIAGNOSIS — R600 Localized edema: Secondary | ICD-10-CM

## 2021-07-14 DIAGNOSIS — M6281 Muscle weakness (generalized): Secondary | ICD-10-CM

## 2021-07-14 NOTE — Therapy (Signed)
OUTPATIENT PHYSICAL THERAPY TREATMENT NOTE   Patient Name: Lee Ballard MRN: [id number] DOB:October 16, 1965, 55 y.o., male Today's Date: 07/14/2021  PCP: Laurey Morale, MD REFERRING PROVIDER: Laurey Morale, MD   PT End of Session - 07/14/21 1725     Visit Number 8    Number of Visits 21    Date for PT Re-Evaluation 09/14/21    Authorization Type [employer]    PT Start Time 9417    PT Stop Time 1720    PT Time Calculation (min) 30 min    Activity Tolerance Patient tolerated treatment well;No increased pain    Behavior During Therapy [facility] for tasks assessed/performed                 Past Medical History:  Diagnosis Date   Edema leg    Erectile dysfunction    Family history of premature CAD 09/28/2016   GERD (gastroesophageal reflux disease)    Hyperlipidemia    Hypertension    Varicose vein of leg    Past Surgical History:  Procedure Laterality Date   COLONOSCOPY  04/29/2015   per Dr. Henrene Pastor, hyperplastic polyps, repeat in 10 yrs    ENDOVENOUS ABLATION SAPHENOUS VEIN W/ LASER Right    SHOULDER ARTHROSCOPY WITH ROTATOR CUFF REPAIR AND SUBACROMIAL DECOMPRESSION Right 06/13/2021   Procedure: RIGHT SHOULDER ARTHROSCOPY WITH ROTATOR CUFF REPAIR;  Surgeon: Vanetta Mulders, MD;  Location: [place];  Service: Orthopedics;  Laterality: Right;   TONSILLECTOMY     Patient Active Problem List   Diagnosis Date Noted   Rotator cuff injury, initial encounter    Subacromial bursitis of right shoulder joint    Nutritional counseling 08/20/2020   Superficial thrombophlebitis 01/15/2019   Family history of premature CAD 09/28/2016   Excessive daytime sleepiness 09/28/2016   Hyperglycemia 03/17/2014   Hyperlipidemia 10/08/2009   VARICOSE VEINS, LOWER EXTREMITIES 10/08/2009   Essential hypertension 11/12/2008   EDEMA LEG 11/12/2008   REFERRING PROVIDER: Vanetta Mulders, MD   REFERRING DIAG: S46.011D (ICD-10-CM) - Traumatic complete tear of right rotator cuff,  subsequent encounter    THERAPY DIAG:  Shoulder joint stiffness, right   Muscle weakness (generalized)   Acute pain of right shoulder   Localized edema     ONSET DATE: 06/13/2021- Surgery   SUBJECTIVE:                                                                                                                                                                                       SUBJECTIVE STATEMENT: Pt states things are going well. He has had no pain with HEP and has gone back to work. Things are still  going well.    PAIN:  Are you having pain? No     PRECAUTIONS: Shoulder, RCR on 06/13/2021   WEIGHT BEARING RESTRICTIONS Yes R RTC repair        OBJECTIVE:  Goals by week 4: 140 flexion, ABD 60-80   OBSERVATION:  Pt presents in sling with abduction pillow.  PROM:  Flexion: 140  ER: 0 deg limited per protocol (subscap repair) ABD: 80 deg ABD   Exercise:  -Pt performed pendulums cw, ccw, and f/b x 20 reps each with 1lb weight  -elbow flexion and extension AROM x 20 reps -Wrist flexion and extension, sup/pro 4lb 20x each -gentle distraction with oscillation, AP and inferior Jt mobs R GHJ grade I-II  -Pt received R shoulder PROM per protocol ranges and pt tolerance in flexion, scaption, and ER (precaution of subscapularis repair with ER)   PATIENT EDUCATION: Education details: surgical precautions, MD protocol, HEP, and ROM findings   Person educated: Patient and spouse Education method: Explanation, Demonstration, Verbal cues Education comprehension: verbalized understanding, returned demonstration, verbal cues required     HOME EXERCISE PROGRAM: Access Code: [email or web address] URL: [email or web address] Date: 06/16/2021 Prepared by: Daleen Bo  Exercises Circular Shoulder Pendulum with Table Support - 3 x daily - 7 x weekly - 1 sets - 20 reps Flexion-Extension Shoulder Pendulum with Table Support - 3 x daily - 7 x weekly - 1 sets - 20 reps Horizontal  Shoulder Pendulum with Table Support - 3 x daily - 7 x weekly - 1 sets - 20 reps Seated Elbow Flexion and Extension AROM - 3 x daily - 7 x weekly - 1 sets - 10 reps Wrist AROM Flexion Extension - 3 x daily - 7 x weekly - 1 sets - 10 reps    ASSESSMENT:   CLINICAL IMPRESSION: Pt maintaining ROM from previous session and is continuing to do well with PT. Continue with 1x/week until 6 week mark for progression to next phase of therapy protocol. Pt responding very well to treatment sessions. Pt will benefit from continued skilled therapy in order to reach goals and maximize functional R UE strength and ROM for full return to PLOF.   Objective impairments include decreased ROM, decreased strength, hypomobility, increased edema, increased fascial restrictions, increased muscle spasms, impaired flexibility, impaired UE functional use, improper body mechanics, postural dysfunction, and pain. These impairments are limiting patient from cleaning, community activity, driving, meal prep, occupation, laundry, yard work, and shopping.       GOALS:     SHORT TERM GOALS:   STG Name Target Date Goal status  1 Pt will become independent with HEP in order to demonstrate synthesis of PT education.   06/30/2021 INITIAL  2 Pt will demonstrate at least a 15 pt improvement in SPADI in order to demonstrate a clinically significant change in shoulder pain and function     07/28/2021 INITIAL  3 Pt will be able to demonstrate R UE PROM to 140 flexion, 40 ER, and 80  ABD in order to demonstrate functional improvement in UE ROM for transition to muscle activation/strength phase of protocol.    07/28/2021 INITIAL  4 Pt will be able to demonstrate full PROM in all planes in order to demonstrate functional improvement in UE ROM for progression to next phase.    07/28/2021 INITIAL    LONG TERM GOALS:    LTG Name Target Date Goal status  1 Pt  will become independent with final HEP in order to demonstrate synthesis  of  PT education.   09/08/2021 INITIAL  2 Pt will be able to demonstrate to perform full AROM in all planes in order to demonstrate functional improvement in UE/LE function for self-care and house hold duties.     09/08/2021 INITIAL  3 Pt will be able to reach [facility] and carry/hold >3 lbs in order to demonstrate functional improvement in R UE strength for return to ADL and basic household duties.     09/08/2021 INITIAL  4 Pt will be able to demonstrate ability to carry 5-10lbs in order to demonstrate functional improvement in UE function for self-care and house hold duties.     09/08/2021 INITIAL    PLAN:    PLANNED INTERVENTIONS: Therapeutic exercises, Therapeutic activity, Neuro Muscular re-education, Patient/Family education, Joint mobilization, Aquatic Therapy, Dry Needling, Electrical stimulation, Spinal mobilization, Cryotherapy, Moist heat, scar mobilization, Taping, Vasopneumatic device, Traction, Ultrasound, Ionotophoresis 4mg /ml Dexamethasone, and Manual therapy   PLAN FOR NEXT SESSION: Cont per MD Rotator Cuff repair protocol and also with regards/precaution to subscapularis protocol.  1x/week until 6 wks   Daleen Bo PT, DPT 07/14/21 5:25 PM

## 2021-07-20 ENCOUNTER — Other Ambulatory Visit: Payer: Self-pay

## 2021-07-20 ENCOUNTER — Ambulatory Visit (HOSPITAL_BASED_OUTPATIENT_CLINIC_OR_DEPARTMENT_OTHER): Payer: 59 | Admitting: Physical Therapy

## 2021-07-20 ENCOUNTER — Encounter (HOSPITAL_BASED_OUTPATIENT_CLINIC_OR_DEPARTMENT_OTHER): Payer: Self-pay | Admitting: Physical Therapy

## 2021-07-20 DIAGNOSIS — M25611 Stiffness of right shoulder, not elsewhere classified: Secondary | ICD-10-CM

## 2021-07-20 DIAGNOSIS — R600 Localized edema: Secondary | ICD-10-CM

## 2021-07-20 DIAGNOSIS — M25511 Pain in right shoulder: Secondary | ICD-10-CM

## 2021-07-20 DIAGNOSIS — M6281 Muscle weakness (generalized): Secondary | ICD-10-CM

## 2021-07-20 NOTE — Therapy (Signed)
OUTPATIENT PHYSICAL THERAPY TREATMENT NOTE   Patient Name: Lee Ballard MRN: [id number] DOB:May 06, 1966, 55 y.o., male Today's Date: 07/20/2021  PCP: Laurey Morale, MD REFERRING PROVIDER: Laurey Morale, MD   PT End of Session - 07/20/21 1648     Visit Number 9    Number of Visits 21    Date for PT Re-Evaluation 09/14/21    Authorization Type U[employer]    PT Start Time 4193    Activity Tolerance Patient tolerated treatment well;No increased pain    Behavior During Therapy [facility] for tasks assessed/performed                  Past Medical History:  Diagnosis Date   Edema leg    Erectile dysfunction    Family history of premature CAD 09/28/2016   GERD (gastroesophageal reflux disease)    Hyperlipidemia    Hypertension    Varicose vein of leg    Past Surgical History:  Procedure Laterality Date   COLONOSCOPY  04/29/2015   per Dr. Henrene Pastor, hyperplastic polyps, repeat in 10 yrs    ENDOVENOUS ABLATION SAPHENOUS VEIN W/ LASER Right    SHOULDER ARTHROSCOPY WITH ROTATOR CUFF REPAIR AND SUBACROMIAL DECOMPRESSION Right 06/13/2021   Procedure: RIGHT SHOULDER ARTHROSCOPY WITH ROTATOR CUFF REPAIR;  Surgeon: Vanetta Mulders, MD;  Location: [place];  Service: Orthopedics;  Laterality: Right;   TONSILLECTOMY     Patient Active Problem List   Diagnosis Date Noted   Rotator cuff injury, initial encounter    Subacromial bursitis of right shoulder joint    Nutritional counseling 08/20/2020   Superficial thrombophlebitis 01/15/2019   Family history of premature CAD 09/28/2016   Excessive daytime sleepiness 09/28/2016   Hyperglycemia 03/17/2014   Hyperlipidemia 10/08/2009   VARICOSE VEINS, LOWER EXTREMITIES 10/08/2009   Essential hypertension 11/12/2008   EDEMA LEG 11/12/2008   REFERRING PROVIDER: Vanetta Mulders, MD   REFERRING DIAG: S46.011D (ICD-10-CM) - Traumatic complete tear of right rotator cuff, subsequent encounter    THERAPY DIAG:  Shoulder joint  stiffness, right   Muscle weakness (generalized)   Acute pain of right shoulder   Localized edema     ONSET DATE: 06/13/2021- Surgery   SUBJECTIVE:                                                                                                                                                                                       SUBJECTIVE STATEMENT: Pt states things are going well. He has had no pain with HEP and has gone back to work. Things are still going well.    PAIN:  Are you having pain? No  PRECAUTIONS: Shoulder, RCR on 06/13/2021   WEIGHT BEARING RESTRICTIONS Yes R RTC repair        OBJECTIVE:  Goals by week 4: 140 flexion, ABD 60-80   OBSERVATION:  Pt presents in sling with abduction pillow.  PROM:  Flexion: 150  ER: 0 deg limited per protocol (subscap repair) ABD: 90 deg ABD   Exercise:  -Pt performed pendulums cw, ccw, and fwd retro x 20 reps each  -Wrist flexion and extension, sup/pro 4lb 20x each -gentle distraction with oscillation, AP and inferior Jt mobs R GHJ grade I-II  -Pt received R shoulder PROM per protocol ranges and pt tolerance in flexion, scaption, and ER (precaution of subscapularis repair with ER)   PATIENT EDUCATION: Education details: surgical precautions, MD protocol, HEP, and ROM findings   Person educated: Patient and spouse Education method: Explanation, Demonstration, Verbal cues Education comprehension: verbalized understanding, returned demonstration, verbal cues required     HOME EXERCISE PROGRAM: Access Code: [email or web address] URL: [email or web address] Date: 06/16/2021 Prepared by: Daleen Bo  Exercises Circular Shoulder Pendulum with Table Support - 3 x daily - 7 x weekly - 1 sets - 20 reps Flexion-Extension Shoulder Pendulum with Table Support - 3 x daily - 7 x weekly - 1 sets - 20 reps Horizontal Shoulder Pendulum with Table Support - 3 x daily - 7 x weekly - 1 sets - 20 reps Seated Elbow Flexion and  Extension AROM - 3 x daily - 7 x weekly - 1 sets - 10 reps Wrist AROM Flexion Extension - 3 x daily - 7 x weekly - 1 sets - 10 reps    ASSESSMENT:   CLINICAL IMPRESSION: Pt continues to maintain previously gained ROM and has met ROM goals for 1st phase. Pt is continuing to do very well with PT. Continue with 1x/week until 6 week mark for progression to next phase of therapy protocol. Pt responding very well to treatment sessions as well as HEP. Pt will benefit from continued skilled therapy in order to reach goals and maximize functional R UE strength and ROM for full return to PLOF.   Objective impairments include decreased ROM, decreased strength, hypomobility, increased edema, increased fascial restrictions, increased muscle spasms, impaired flexibility, impaired UE functional use, improper body mechanics, postural dysfunction, and pain. These impairments are limiting patient from cleaning, community activity, driving, meal prep, occupation, laundry, yard work, and shopping.       GOALS:     SHORT TERM GOALS:   STG Name Target Date Goal status  1 Pt will become independent with HEP in order to demonstrate synthesis of PT education.   06/30/2021 INITIAL  2 Pt will demonstrate at least a 15 pt improvement in SPADI in order to demonstrate a clinically significant change in shoulder pain and function     07/28/2021 INITIAL  3 Pt will be able to demonstrate R UE PROM to 140 flexion, 40 ER, and 80  ABD in order to demonstrate functional improvement in UE ROM for transition to muscle activation/strength phase of protocol.    07/28/2021 INITIAL  4 Pt will be able to demonstrate full PROM in all planes in order to demonstrate functional improvement in UE ROM for progression to next phase.    07/28/2021 INITIAL    LONG TERM GOALS:    LTG Name Target Date Goal status  1 Pt  will become independent with final HEP in order to demonstrate synthesis of PT education.   09/08/2021 INITIAL  2 Pt  will be able  to demonstrate to perform full AROM in all planes in order to demonstrate functional improvement in UE/LE function for self-care and house hold duties.     09/08/2021 INITIAL  3 Pt will be able to reach [facility] and carry/hold >3 lbs in order to demonstrate functional improvement in R UE strength for return to ADL and basic household duties.     09/08/2021 INITIAL  4 Pt will be able to demonstrate ability to carry 5-10lbs in order to demonstrate functional improvement in UE function for self-care and house hold duties.     09/08/2021 INITIAL    PLAN:    PLANNED INTERVENTIONS: Therapeutic exercises, Therapeutic activity, Neuro Muscular re-education, Patient/Family education, Joint mobilization, Aquatic Therapy, Dry Needling, Electrical stimulation, Spinal mobilization, Cryotherapy, Moist heat, scar mobilization, Taping, Vasopneumatic device, Traction, Ultrasound, Ionotophoresis 21m/ml Dexamethasone, and Manual therapy   PLAN FOR NEXT SESSION: Cont per MD Rotator Cuff repair protocol and also with regards/precaution to subscapularis protocol.  1x/week until 6 wks     ADaleen BoPT, DPT 07/20/21 5:25 PM

## 2021-07-25 ENCOUNTER — Ambulatory Visit (INDEPENDENT_AMBULATORY_CARE_PROVIDER_SITE_OTHER): Payer: 59 | Admitting: Orthopaedic Surgery

## 2021-07-25 ENCOUNTER — Other Ambulatory Visit: Payer: Self-pay

## 2021-07-25 DIAGNOSIS — S46011D Strain of muscle(s) and tendon(s) of the rotator cuff of right shoulder, subsequent encounter: Secondary | ICD-10-CM

## 2021-07-25 NOTE — Progress Notes (Signed)
Post Operative Evaluation    Procedure/Date of Surgery: 06/13/21 right shoulder rotator cuff repair  Interval History:   Clair Gulling presents today status post right shoulder rotator cuff repair 5 weeks.  Overall he is doing extremely well.  Passively he has been able to establish essentially normal range of motion.  He has no pain at all.  No difficulty sleeping.  He is not taking any pain medication.   PMH/PSH/Family History/Social History/Meds/Allergies:    Past Medical History:  Diagnosis Date   Edema leg    Erectile dysfunction    Family history of premature CAD 09/28/2016   GERD (gastroesophageal reflux disease)    Hyperlipidemia    Hypertension    Varicose vein of leg    Past Surgical History:  Procedure Laterality Date   COLONOSCOPY  04/29/2015   per Dr. Henrene Pastor, hyperplastic polyps, repeat in 10 yrs    ENDOVENOUS ABLATION SAPHENOUS VEIN W/ LASER Right    SHOULDER ARTHROSCOPY WITH ROTATOR CUFF REPAIR AND SUBACROMIAL DECOMPRESSION Right 06/13/2021   Procedure: RIGHT SHOULDER ARTHROSCOPY WITH ROTATOR CUFF REPAIR;  Surgeon: Vanetta Mulders, MD;  Location: [place];  Service: Orthopedics;  Laterality: Right;   TONSILLECTOMY     Social History   Socioeconomic History   Marital status: Divorced    Spouse name: Not on file   Number of children: Not on file   Years of education: Not on file   Highest education level: Not on file  Occupational History   Not on file  Tobacco Use   Smoking status: Never   Smokeless tobacco: Never  Substance and Sexual Activity   Alcohol use: No    Alcohol/week: 0.0 standard drinks   Drug use: No   Sexual activity: Not on file  Other Topics Concern   Not on file  Social History Narrative   Not on file   Social Determinants of Health   Financial Resource Strain: Not on file  Food Insecurity: Not on file  Transportation Needs: Not on file  Physical Activity: Not on file  Stress: Not on  file  Social Connections: Not on file   Family History  Problem Relation Age of Onset   Cancer Mother    Heart attack Father 21   Colon cancer Neg Hx    No Known Allergies Current Outpatient Medications  Medication Sig Dispense Refill   aspirin EC 325 MG tablet Take 1 tablet (325 mg total) by mouth daily. 30 tablet 0   aspirin EC 81 MG tablet Take 1 tablet (81 mg total) by mouth daily. 90 tablet 3   Calcium 200 MG TABS Take 400 mg by mouth daily.     ibuprofen (ADVIL,MOTRIN) 200 MG tablet Take 200 mg by mouth every 6 (six) hours as needed.     lisinopril (ZESTRIL) 30 MG tablet TAKE 1 TABLET BY MOUTH DAILY. (PLEASE MAKE YEARLY APPT WITH DR. Radford Pax FOR JAN FOR FUTURE REFILLS) 90 tablet 0   Multiple Vitamins-Minerals (MULTIVITAMIN,TX-MINERALS) tablet Take 1 tablet by mouth daily.       Omega-3 Fatty Acids (FISH OIL) 1200 MG CAPS Take 1,200 mg by mouth.     omeprazole (PRILOSEC) 40 MG capsule Take 1 capsule (40 mg total) by mouth in the morning and at bedtime. 180 capsule 3   oxyCODONE (OXY IR/ROXICODONE) 5 MG immediate  release tablet Take 1 tablet (5 mg total) by mouth every 4 (four) hours as needed (severe pain). 20 tablet 0   POTASSIUM PO Take 400 mg by mouth daily.     simvastatin (ZOCOR) 40 MG tablet TAKE 1 TABLET BY MOUTH EVERYDAY AT BEDTIME 90 tablet 0   vardenafil (LEVITRA) 20 MG tablet Take 1 tablet (20 mg total) by mouth as needed for erectile dysfunction. 3 tablet 11   No current facility-administered medications for this visit.   No results found.  Review of Systems:   A ROS was performed including pertinent positives and negatives as documented in the HPI.   Musculoskeletal Exam:    There were no vitals taken for this visit.  Right shoulder incisions are well-healed.  Active forward elevation is to 160 degrees without difficulty, external rotation at the side is to 50 degrees which is equal to contralateral side.  Internal rotation is to L1 without difficulty  Imaging:     None  I personally reviewed and interpreted the radiographs.   Assessment:   55 year old male 6 weeks status post right shoulder rotator cuff repair.  Overall he is doing extremely well.  He may begin active range of motion at this time.  I have advised that ultimately he may discuss aquatic therapy with his physical therapist I do believe this might significantly help him.  Plan :    -Return to clinic in 6 weeks, continue to advance per protocol    I personally saw and evaluated the patient, and participated in the management and treatment plan.  Vanetta Mulders, MD Attending Physician, Orthopedic Surgery  This document was dictated using Dragon voice recognition software. A reasonable attempt at proof reading has been made to minimize errors.

## 2021-07-27 ENCOUNTER — Ambulatory Visit (HOSPITAL_BASED_OUTPATIENT_CLINIC_OR_DEPARTMENT_OTHER): Payer: 59 | Admitting: Physical Therapy

## 2021-07-27 ENCOUNTER — Other Ambulatory Visit: Payer: Self-pay

## 2021-07-27 DIAGNOSIS — R600 Localized edema: Secondary | ICD-10-CM

## 2021-07-27 DIAGNOSIS — M6281 Muscle weakness (generalized): Secondary | ICD-10-CM

## 2021-07-27 DIAGNOSIS — M25611 Stiffness of right shoulder, not elsewhere classified: Secondary | ICD-10-CM

## 2021-07-27 DIAGNOSIS — M25511 Pain in right shoulder: Secondary | ICD-10-CM

## 2021-07-27 NOTE — Therapy (Signed)
OUTPATIENT PHYSICAL THERAPY PROGRESS NOTE   Patient Name: Lee Ballard MRN: [id number] DOB:07-13-1966, 55 y.o., male Today's Date: 07/27/2021  PCP: Laurey Morale, MD REFERRING PROVIDER: Laurey Morale, MD   PT End of Session - 07/27/21 1726     Visit Number 10    Number of Visits 21    Date for PT Re-Evaluation 09/14/21    Authorization Type [employer]    PT Start Time 1157    PT Stop Time 2620    PT Time Calculation (min) 39 min    Activity Tolerance Patient tolerated treatment well;No increased pain    Behavior During Therapy [facility] for tasks assessed/performed                 Past Medical History:  Diagnosis Date   Edema leg    Erectile dysfunction    Family history of premature CAD 09/28/2016   GERD (gastroesophageal reflux disease)    Hyperlipidemia    Hypertension    Varicose vein of leg    Past Surgical History:  Procedure Laterality Date   COLONOSCOPY  04/29/2015   per Dr. Henrene Pastor, hyperplastic polyps, repeat in 10 yrs    ENDOVENOUS ABLATION SAPHENOUS VEIN W/ LASER Right    SHOULDER ARTHROSCOPY WITH ROTATOR CUFF REPAIR AND SUBACROMIAL DECOMPRESSION Right 06/13/2021   Procedure: RIGHT SHOULDER ARTHROSCOPY WITH ROTATOR CUFF REPAIR;  Surgeon: Vanetta Mulders, MD;  Location: [place];  Service: Orthopedics;  Laterality: Right;   TONSILLECTOMY     Patient Active Problem List   Diagnosis Date Noted   Rotator cuff injury, initial encounter    Subacromial bursitis of right shoulder joint    Nutritional counseling 08/20/2020   Superficial thrombophlebitis 01/15/2019   Family history of premature CAD 09/28/2016   Excessive daytime sleepiness 09/28/2016   Hyperglycemia 03/17/2014   Hyperlipidemia 10/08/2009   VARICOSE VEINS, LOWER EXTREMITIES 10/08/2009   Essential hypertension 11/12/2008   EDEMA LEG 11/12/2008   REFERRING PROVIDER: Vanetta Mulders, MD   REFERRING DIAG: S46.011D (ICD-10-CM) - Traumatic complete tear of right rotator cuff,  subsequent encounter    THERAPY DIAG:  Shoulder joint stiffness, right   Muscle weakness (generalized)   Acute pain of right shoulder   Localized edema     ONSET DATE: 06/13/2021- Surgery   SUBJECTIVE:                                                                                                                                                                                       SUBJECTIVE STATEMENT: Pt states things are going well. Pt has no pain at all. Pt states MD visit went well. No longer in sling  and able to perform AROM at this time.    PAIN:  Are you having pain? No     PRECAUTIONS: Shoulder, RCR on 06/13/2021   WEIGHT BEARING RESTRICTIONS Yes R RTC repair        OBJECTIVE:     UPPER EXTREMITY AROM/PROM:     R shoulder AROM: flexion 165 in supine, ABD 130, scaption 145, ER at 0 60 deg             R shoulder PROM: 170 flexion, ABD 165, ER 70, ER WNL   UPPER EXTREMITY MMT:     R shoulder untested due to surgical precautions; able to perform all motions against gravity without pain  TODAY'S TREATMENT   Exercise: Joint mob: posterior and inf R GHJ mob grade III  Supine protraction 2x10 AROM scaption and ABD 2x10 each Prone I Y T 2x10 each Supine flexion 2x10   PATIENT EDUCATION: Education details: surgical precautions, MD protocol, HEP, and ROM findings   Person educated: Patient and spouse Education method: Explanation, Demonstration, Verbal cues Education comprehension: verbalized understanding, returned demonstration, verbal cues required     HOME EXERCISE PROGRAM: Access Code: [email or web address] URL: [email or web address] Date: 07/27/2021 Prepared by: Daleen Bo  Exercises Standing Shoulder Scaption - 1 x daily - 7 x weekly - 3 sets - 10 reps Shoulder Abduction - Thumbs Up - 2 x daily - 7 x weekly - 2 sets - 10 reps Prone Scapular Retraction Y - 2 x daily - 7 x weekly - 3 sets - 10 reps Prone Shoulder Extension - Single Arm - 2 x  daily - 7 x weekly - 3 sets - 10 reps Prone Shoulder Horizontal Abduction - 2 x daily - 7 x weekly - 3 sets - 10 reps Seated Shoulder Flexion Towel Slide at Table Top - 2 x daily - 7 x weekly - 1 sets - 10 reps - 5 hold Seated Shoulder Abduction Towel Slide at Table Top - 2 x daily - 7 x weekly - 1 sets - 10 reps - 5 hold     ASSESSMENT:   CLINICAL IMPRESSION: Pt able to progress to AROM today per last MD visit note. Pt performed AROM very well today without pain but with signficant fatigue and endurance deficits with sustained reptitions as expected. Pt HEP updated to include AROM exercise. Plan to introduce aquatics in order to progress gentle R shoulder strength and ROM. Pt will benefit from continued skilled therapy in order to reach goals and maximize functional R UE strength and ROM for full return to PLOF.   Objective impairments include decreased ROM, decreased strength, hypomobility, increased edema, increased fascial restrictions, increased muscle spasms, impaired flexibility, impaired UE functional use, improper body mechanics, postural dysfunction, and pain. These impairments are limiting patient from cleaning, community activity, driving, meal prep, occupation, laundry, yard work, and shopping.       GOALS:     SHORT TERM GOALS:   STG Name Target Date Goal status  1 Pt will become independent with HEP in order to demonstrate synthesis of PT education.   06/30/2021 MET  2 Pt will demonstrate at least a 15 pt improvement in SPADI in order to demonstrate a clinically significant change in shoulder pain and function     07/28/2021 Plan to take at next session  3 Pt will be able to demonstrate R UE PROM to 140 flexion, 40 ER, and 80  ABD in order to demonstrate functional improvement in UE ROM for transition to  muscle activation/strength phase of protocol.    07/28/2021 MET  4 Pt will be able to demonstrate full PROM in all planes in order to demonstrate functional improvement  in UE ROM for progression to next phase.    07/28/2021 ongoing    LONG TERM GOALS:    LTG Name Target Date Goal status  1 Pt  will become independent with final HEP in order to demonstrate synthesis of PT education.   09/08/2021 ongoing  2 Pt will be able to demonstrate to perform full AROM in all planes in order to demonstrate functional improvement in UE/LE function for self-care and house hold duties.     09/08/2021 ongoing  3 Pt will be able to reach [facility] and carry/hold >3 lbs in order to demonstrate functional improvement in R UE strength for return to ADL and basic household duties.     09/08/2021 ongoing  4 Pt will be able to demonstrate ability to carry 5-10lbs in order to demonstrate functional improvement in UE function for self-care and house hold duties.     09/08/2021 ongoing    PLAN:    PLANNED INTERVENTIONS: Therapeutic exercises, Therapeutic activity, Neuro Muscular re-education, Patient/Family education, Joint mobilization, Aquatic Therapy, Dry Needling, Electrical stimulation, Spinal mobilization, Cryotherapy, Moist heat, scar mobilization, Taping, Vasopneumatic device, Traction, Ultrasound, Ionotophoresis 42m/ml Dexamethasone, and Manual therapy   PLAN FOR NEXT SESSION: Cont per MD Rotator Cuff repair protocol and also with regards/precaution to subscapularis protocol.  Review HEP, resisted protraction, light rowing, take SPADI*     ADaleen BoPT, DPT 07/27/21 5:33 PM

## 2021-08-03 ENCOUNTER — Other Ambulatory Visit: Payer: Self-pay

## 2021-08-03 ENCOUNTER — Ambulatory Visit (HOSPITAL_BASED_OUTPATIENT_CLINIC_OR_DEPARTMENT_OTHER): Payer: 59 | Admitting: Physical Therapy

## 2021-08-03 ENCOUNTER — Encounter (HOSPITAL_BASED_OUTPATIENT_CLINIC_OR_DEPARTMENT_OTHER): Payer: Self-pay | Admitting: Physical Therapy

## 2021-08-03 DIAGNOSIS — M25611 Stiffness of right shoulder, not elsewhere classified: Secondary | ICD-10-CM | POA: Diagnosis not present

## 2021-08-03 DIAGNOSIS — M25511 Pain in right shoulder: Secondary | ICD-10-CM

## 2021-08-03 DIAGNOSIS — M6281 Muscle weakness (generalized): Secondary | ICD-10-CM

## 2021-08-03 NOTE — Therapy (Signed)
OUTPATIENT PHYSICAL THERAPY TREATMENT NOTE   Patient Name: Lee Ballard MRN: [id number] DOB:[birth year], 55 y.o., male Today's Date: 08/03/2021  PCP: Laurey Morale, MD REFERRING PROVIDER: Laurey Morale, MD   PT End of Session - 08/03/21 1721     Visit Number 11    Number of Visits 21    Date for PT Re-Evaluation 09/14/21    Authorization Type UHC    PT Start Time 9767    PT Stop Time 1726    PT Time Calculation (min) 39 min    Activity Tolerance Patient tolerated treatment well;No increased pain    Behavior During Therapy [facility] for tasks assessed/performed                  Past Medical History:  Diagnosis Date   Edema leg    Erectile dysfunction    Family history of premature CAD 09/28/2016   GERD (gastroesophageal reflux disease)    Hyperlipidemia    Hypertension    Varicose vein of leg    Past Surgical History:  Procedure Laterality Date   COLONOSCOPY  04/29/2015   per Dr. Henrene Pastor, hyperplastic polyps, repeat in 10 yrs    ENDOVENOUS ABLATION SAPHENOUS VEIN W/ LASER Right    SHOULDER ARTHROSCOPY WITH ROTATOR CUFF REPAIR AND SUBACROMIAL DECOMPRESSION Right 06/13/2021   Procedure: RIGHT SHOULDER ARTHROSCOPY WITH ROTATOR CUFF REPAIR;  Surgeon: Vanetta Mulders, MD;  Location: [place];  Service: Orthopedics;  Laterality: Right;   TONSILLECTOMY     Patient Active Problem List   Diagnosis Date Noted   Rotator cuff injury, initial encounter    Subacromial bursitis of right shoulder joint    Nutritional counseling 08/20/2020   Superficial thrombophlebitis 01/15/2019   Family history of premature CAD 09/28/2016   Excessive daytime sleepiness 09/28/2016   Hyperglycemia 03/17/2014   Hyperlipidemia 10/08/2009   VARICOSE VEINS, LOWER EXTREMITIES 10/08/2009   Essential hypertension 11/12/2008   EDEMA LEG 11/12/2008   REFERRING PROVIDER: Vanetta Mulders, MD   REFERRING DIAG: S46.011D (ICD-10-CM) - Traumatic complete tear of right rotator cuff,  subsequent encounter    THERAPY DIAG:  Shoulder joint stiffness, right   Muscle weakness (generalized)   Acute pain of right shoulder   Localized edema     ONSET DATE: 06/13/2021- Surgery   SUBJECTIVE:                                                                                                                                                                                       SUBJECTIVE STATEMENT: Pt states things are going well. He has no pain. His table surface at home is not high enough in order  to do IYTs.    PAIN:  Are you having pain? No     PRECAUTIONS: Shoulder, RCR on 06/13/2021   WEIGHT BEARING RESTRICTIONS Yes R RTC repair        OBJECTIVE:      TODAY'S TREATMENT   Exercise: Joint mob: posterior and inf R GHJ mob grade III  Supine protraction w/ slight ER 3x10 AROM scaption and ABD 2x10 each Bent over I Y T 2x10 each Supine flexion 2x10 IR strap stretch 15s 4x IR/ER isometrics 5s 10x   PATIENT EDUCATION: Education details: surgical precautions, MD protocol, HEP, and ROM findings   Person educated: Patient and spouse Education method: Explanation, Demonstration, Verbal cues Education comprehension: verbalized understanding, returned demonstration, verbal cues required     HOME EXERCISE PROGRAM: Access Code: [email or web address] URL: [email or web address] Date: 07/27/2021 Prepared by: Daleen Bo  Exercises Standing Shoulder Scaption - 1 x daily - 7 x weekly - 3 sets - 10 reps Shoulder Abduction - Thumbs Up - 2 x daily - 7 x weekly - 2 sets - 10 reps Prone Scapular Retraction Y - 2 x daily - 7 x weekly - 3 sets - 10 reps Prone Shoulder Extension - Single Arm - 2 x daily - 7 x weekly - 3 sets - 10 reps Prone Shoulder Horizontal Abduction - 2 x daily - 7 x weekly - 3 sets - 10 reps Seated Shoulder Flexion Towel Slide at Table Top - 2 x daily - 7 x weekly - 1 sets - 10 reps - 5 hold Seated Shoulder Abduction Towel Slide at Table Top - 2 x  daily - 7 x weekly - 1 sets - 10 reps - 5 hold     ASSESSMENT:   CLINICAL IMPRESSION: Pt able to continue with previous AROM per MD request without pain or difficulty, though UT compensation noted with bent over  I, Y, and Ts and scaption. Pt able to incorporate rotational isometrics today without pain. Plan to revisit at next session to consider addition to HEP in addition to IR stretch.Pt is 7 weeks and 3 days today. Pt is progressing very well with therapy. Pt will benefit from continued skilled therapy in order to reach goals and maximize functional R UE strength and ROM for full return to PLOF.   Objective impairments include decreased ROM, decreased strength, hypomobility, increased edema, increased fascial restrictions, increased muscle spasms, impaired flexibility, impaired UE functional use, improper body mechanics, postural dysfunction, and pain. These impairments are limiting patient from cleaning, community activity, driving, meal prep, occupation, laundry, yard work, and shopping.       GOALS:     SHORT TERM GOALS:   STG Name Target Date Goal status  1 Pt will become independent with HEP in order to demonstrate synthesis of PT education.   06/30/2021 MET  2 Pt will demonstrate at least a 15 pt improvement in SPADI in order to demonstrate a clinically significant change in shoulder pain and function     07/28/2021 Plan to take at next session  3 Pt will be able to demonstrate R UE PROM to 140 flexion, 40 ER, and 80  ABD in order to demonstrate functional improvement in UE ROM for transition to muscle activation/strength phase of protocol.    07/28/2021 MET  4 Pt will be able to demonstrate full PROM in all planes in order to demonstrate functional improvement in UE ROM for progression to next phase.    07/28/2021 ongoing    LONG TERM GOALS:  LTG Name Target Date Goal status  1 Pt  will become independent with final HEP in order to demonstrate synthesis of PT  education.   09/08/2021 ongoing  2 Pt will be able to demonstrate to perform full AROM in all planes in order to demonstrate functional improvement in UE/LE function for self-care and house hold duties.     09/08/2021 ongoing  3 Pt will be able to reach [facility] and carry/hold >3 lbs in order to demonstrate functional improvement in R UE strength for return to ADL and basic household duties.     09/08/2021 ongoing  4 Pt will be able to demonstrate ability to carry 5-10lbs in order to demonstrate functional improvement in UE function for self-care and house hold duties.     09/08/2021 ongoing    PLAN:    PLANNED INTERVENTIONS: Therapeutic exercises, Therapeutic activity, Neuro Muscular re-education, Patient/Family education, Joint mobilization, Aquatic Therapy, Dry Needling, Electrical stimulation, Spinal mobilization, Cryotherapy, Moist heat, scar mobilization, Taping, Vasopneumatic device, Traction, Ultrasound, Ionotophoresis 66m/ml Dexamethasone, and Manual therapy   PLAN FOR NEXT SESSION: Cont per MD Rotator Cuff repair protocol and also with regards/precaution to subscapularis protocol.  Review HEP, resisted protraction, light rowing, update HEP    ADaleen BoPT, DPT 08/03/21 5:38 PM

## 2021-08-04 ENCOUNTER — Ambulatory Visit: Payer: 59 | Admitting: Cardiology

## 2021-08-08 ENCOUNTER — Ambulatory Visit (HOSPITAL_BASED_OUTPATIENT_CLINIC_OR_DEPARTMENT_OTHER): Payer: 59 | Attending: Orthopaedic Surgery | Admitting: Physical Therapy

## 2021-08-08 ENCOUNTER — Encounter (HOSPITAL_BASED_OUTPATIENT_CLINIC_OR_DEPARTMENT_OTHER): Payer: Self-pay | Admitting: Physical Therapy

## 2021-08-08 ENCOUNTER — Other Ambulatory Visit: Payer: Self-pay

## 2021-08-08 DIAGNOSIS — M6281 Muscle weakness (generalized): Secondary | ICD-10-CM | POA: Diagnosis present

## 2021-08-08 DIAGNOSIS — M25611 Stiffness of right shoulder, not elsewhere classified: Secondary | ICD-10-CM | POA: Diagnosis present

## 2021-08-08 DIAGNOSIS — M25511 Pain in right shoulder: Secondary | ICD-10-CM | POA: Insufficient documentation

## 2021-08-08 DIAGNOSIS — R600 Localized edema: Secondary | ICD-10-CM | POA: Insufficient documentation

## 2021-08-08 NOTE — Therapy (Signed)
OUTPATIENT PHYSICAL THERAPY TREATMENT NOTE   Patient Name: Lee Ballard MRN: [id number] DOB:18-Nov-1965, 55 y.o., male Today's Date: 08/08/2021  PCP: Laurey Morale, MD REFERRING PROVIDER: Laurey Morale, MD   PT End of Session - 08/08/21 1651     Visit Number 12    Number of Visits 21    Date for PT Re-Evaluation 09/14/21    Authorization Type UHC    PT Start Time 9179    PT Stop Time 1730    PT Time Calculation (min) 40 min    Activity Tolerance Patient tolerated treatment well;No increased pain    Behavior During Therapy [facility] for tasks assessed/performed                  Past Medical History:  Diagnosis Date   Edema leg    Erectile dysfunction    Family history of premature CAD 09/28/2016   GERD (gastroesophageal reflux disease)    Hyperlipidemia    Hypertension    Varicose vein of leg    Past Surgical History:  Procedure Laterality Date   COLONOSCOPY  04/29/2015   per Dr. Henrene Pastor, hyperplastic polyps, repeat in 10 yrs    ENDOVENOUS ABLATION SAPHENOUS VEIN W/ LASER Right    SHOULDER ARTHROSCOPY WITH ROTATOR CUFF REPAIR AND SUBACROMIAL DECOMPRESSION Right 06/13/2021   Procedure: RIGHT SHOULDER ARTHROSCOPY WITH ROTATOR CUFF REPAIR;  Surgeon: Vanetta Mulders, MD;  Location: [place];  Service: Orthopedics;  Laterality: Right;   TONSILLECTOMY     Patient Active Problem List   Diagnosis Date Noted   Rotator cuff injury, initial encounter    Subacromial bursitis of right shoulder joint    Nutritional counseling 08/20/2020   Superficial thrombophlebitis 01/15/2019   Family history of premature CAD 09/28/2016   Excessive daytime sleepiness 09/28/2016   Hyperglycemia 03/17/2014   Hyperlipidemia 10/08/2009   VARICOSE VEINS, LOWER EXTREMITIES 10/08/2009   Essential hypertension 11/12/2008   EDEMA LEG 11/12/2008   REFERRING PROVIDER: Vanetta Mulders, MD   REFERRING DIAG: S46.011D (ICD-10-CM) - Traumatic complete tear of right rotator cuff,  subsequent encounter    THERAPY DIAG:  Shoulder joint stiffness, right   Muscle weakness (generalized)   Acute pain of right shoulder   Localized edema     ONSET DATE: 06/13/2021- Surgery   SUBJECTIVE:                                                                                                                                                                                       SUBJECTIVE STATEMENT: Pt states things are going well. He has no pain.    PAIN:  Are you having pain? No  PRECAUTIONS: Shoulder, RCR on 06/13/2021   WEIGHT BEARING RESTRICTIONS Yes R RTC repair        OBJECTIVE:      TODAY'S TREATMENT  12/5  Pt seen for aquatic therapy today.  Treatment took place in water 3.25-4 ft in depth at the [facility] pool. Temp of water was 91.  Pt entered/exited the pool via stairs (alternating) independently with no rail support.   Warm up: fwd, retro, sidestepping with arm flexion,ext, and abd 3x  Exercise:   standing shoulder extension in half squat 2x10 Standing shoulder extension from 45 to 0 yellow DB 2x10 Standing ER stretch edge of pool 10s 10x Scaption 2x10 ABD palm down 2x10 Standing shoulder flexion stretch on edge of pool 10s 10x Arm circles at 0, 45, and 90 deg ABD 10x each Back against wall push kickboard 2x10    Pt requires buoyancy for support and to offload joints with strengthening exercises. Viscosity of the water is needed for resistance of strengthening; water current perturbations provides challenge to standing balance unsupported, requiring increased core activation.   Previous: Exercise: Joint mob: posterior and inf R GHJ mob grade III  Supine protraction w/ slight ER 3x10 AROM scaption and ABD 2x10 each Bent over I Y T 2x10 each Supine flexion 2x10 IR strap stretch 15s 4x IR/ER isometrics 5s 10x   PATIENT EDUCATION: Education details: surgical precautions, MD protocol, HEP, aquatic environment Person  educated: Patient and spouse Education method: Consulting civil engineer, Media planner, Verbal cues Education comprehension: verbalized understanding, returned demonstration, verbal cues required     HOME EXERCISE PROGRAM: Access Code: [email or web address] URL: [email or web address] Date: 07/27/2021 Prepared by: Daleen Bo  Exercises Standing Shoulder Scaption - 1 x daily - 7 x weekly - 3 sets - 10 reps Shoulder Abduction - Thumbs Up - 2 x daily - 7 x weekly - 2 sets - 10 reps Prone Scapular Retraction Y - 2 x daily - 7 x weekly - 3 sets - 10 reps Prone Shoulder Extension - Single Arm - 2 x daily - 7 x weekly - 3 sets - 10 reps Prone Shoulder Horizontal Abduction - 2 x daily - 7 x weekly - 3 sets - 10 reps Seated Shoulder Flexion Towel Slide at Table Top - 2 x daily - 7 x weekly - 1 sets - 10 reps - 5 hold Seated Shoulder Abduction Towel Slide at Table Top - 2 x daily - 7 x weekly - 1 sets - 10 reps - 5 hold     ASSESSMENT:   CLINICAL IMPRESSION: Pt with very good tolerance to intro aquatic therapy session. Pt able to preform gentle resistance exercise at the shoulder without increased pain or discomfort. Arm movement velocity kept low in order to reduce drag with current protocol levels of resistance. Pt did demonstrate fatigue and tetany with longer lever arm/shoulder position. Pt does have difficulty with scapular depression and retraction on the R at this time with resisted movements. Plan to perform alternating land and aquatic visits. Pt will benefit from continued skilled therapy in order to reach goals and maximize functional R UE strength and ROM for full return to PLOF.   Objective impairments include decreased ROM, decreased strength, hypomobility, increased edema, increased fascial restrictions, increased muscle spasms, impaired flexibility, impaired UE functional use, improper body mechanics, postural dysfunction, and pain. These impairments are limiting patient from cleaning, community  activity, driving, meal prep, occupation, laundry, yard work, and shopping.       GOALS:     SHORT  TERM GOALS:   STG Name Target Date Goal status  1 Pt will become independent with HEP in order to demonstrate synthesis of PT education.   06/30/2021 MET  2 Pt will demonstrate at least a 15 pt improvement in SPADI in order to demonstrate a clinically significant change in shoulder pain and function     07/28/2021 Plan to take at next session  3 Pt will be able to demonstrate R UE PROM to 140 flexion, 40 ER, and 80  ABD in order to demonstrate functional improvement in UE ROM for transition to muscle activation/strength phase of protocol.    07/28/2021 MET  4 Pt will be able to demonstrate full PROM in all planes in order to demonstrate functional improvement in UE ROM for progression to next phase.    07/28/2021 ongoing    LONG TERM GOALS:    LTG Name Target Date Goal status  1 Pt  will become independent with final HEP in order to demonstrate synthesis of PT education.   09/08/2021 ongoing  2 Pt will be able to demonstrate to perform full AROM in all planes in order to demonstrate functional improvement in UE/LE function for self-care and house hold duties.     09/08/2021 ongoing  3 Pt will be able to reach [facility] and carry/hold >3 lbs in order to demonstrate functional improvement in R UE strength for return to ADL and basic household duties.     09/08/2021 ongoing  4 Pt will be able to demonstrate ability to carry 5-10lbs in order to demonstrate functional improvement in UE function for self-care and house hold duties.     09/08/2021 ongoing    PLAN:    PLANNED INTERVENTIONS: Therapeutic exercises, Therapeutic activity, Neuro Muscular re-education, Patient/Family education, Joint mobilization, Aquatic Therapy, Dry Needling, Electrical stimulation, Spinal mobilization, Cryotherapy, Moist heat, scar mobilization, Taping, Vasopneumatic device, Traction, Ultrasound, Ionotophoresis 49m/ml  Dexamethasone, and Manual therapy   PLAN FOR NEXT SESSION: Cont per MD Rotator Cuff repair protocol and also with regards/precaution to subscapularis protocol.  Review HEP, resisted protraction, light rowing, update HEP    ADaleen BoPT, DPT 08/08/21 5:33 PM

## 2021-08-10 ENCOUNTER — Encounter (HOSPITAL_BASED_OUTPATIENT_CLINIC_OR_DEPARTMENT_OTHER): Payer: 59 | Admitting: Physical Therapy

## 2021-08-15 ENCOUNTER — Other Ambulatory Visit: Payer: Self-pay

## 2021-08-15 ENCOUNTER — Encounter (HOSPITAL_BASED_OUTPATIENT_CLINIC_OR_DEPARTMENT_OTHER): Payer: Self-pay | Admitting: Physical Therapy

## 2021-08-15 ENCOUNTER — Ambulatory Visit (HOSPITAL_BASED_OUTPATIENT_CLINIC_OR_DEPARTMENT_OTHER): Payer: 59 | Admitting: Physical Therapy

## 2021-08-15 DIAGNOSIS — M25511 Pain in right shoulder: Secondary | ICD-10-CM

## 2021-08-15 DIAGNOSIS — R600 Localized edema: Secondary | ICD-10-CM

## 2021-08-15 DIAGNOSIS — M25611 Stiffness of right shoulder, not elsewhere classified: Secondary | ICD-10-CM

## 2021-08-15 DIAGNOSIS — M6281 Muscle weakness (generalized): Secondary | ICD-10-CM

## 2021-08-15 NOTE — Therapy (Signed)
OUTPATIENT PHYSICAL THERAPY TREATMENT NOTE   Patient Name: Lee Ballard MRN: [id number] DOB:02/21/1966, 55 y.o., male Today's Date: 08/15/2021  PCP: Lee Ballard, Lee A, MD REFERRING PROVIDER: Fry, Lee A, MD   PT End of Session - 08/15/21 1641     Visit Number 13    Number of Visits 21    Date for PT Re-Evaluation 09/14/21    Authorization Type UHC    PT Start Time 1645    PT Stop Time 1725    PT Time Calculation (min) 40 min    Activity Tolerance Patient tolerated treatment well;No increased pain    Behavior During Therapy [facility] for tasks assessed/performed                  Past Medical History:  Diagnosis Date   Edema leg    Erectile dysfunction    Family history of premature CAD 09/28/2016   GERD (gastroesophageal reflux disease)    Hyperlipidemia    Hypertension    Varicose vein of leg    Past Surgical History:  Procedure Laterality Date   COLONOSCOPY  04/29/2015   per Dr. Perry, hyperplastic polyps, repeat in 10 yrs    ENDOVENOUS ABLATION SAPHENOUS VEIN W/ LASER Right    SHOULDER ARTHROSCOPY WITH ROTATOR CUFF REPAIR AND SUBACROMIAL DECOMPRESSION Right 06/13/2021   Procedure: RIGHT SHOULDER ARTHROSCOPY WITH ROTATOR CUFF REPAIR;  Surgeon: Lee Ballard, Steven, MD;  Location: [facility];  Service: Orthopedics;  Laterality: Right;   TONSILLECTOMY     Patient Active Problem List   Diagnosis Date Noted   Rotator cuff injury, initial encounter    Subacromial bursitis of right shoulder joint    Nutritional counseling 08/20/2020   Superficial thrombophlebitis 01/15/2019   Family history of premature CAD 09/28/2016   Excessive daytime sleepiness 09/28/2016   Hyperglycemia 03/17/2014   Hyperlipidemia 10/08/2009   VARICOSE VEINS, LOWER EXTREMITIES 10/08/2009   Essential hypertension 11/12/2008   EDEMA LEG 11/12/2008   REFERRING PROVIDER: Bokshan, Steven, MD   REFERRING DIAG: S46.011D (ICD-10-CM) - Traumatic complete tear of right rotator cuff,  subsequent encounter    THERAPY DIAG:  Shoulder joint stiffness, right   Muscle weakness (generalized)   Acute pain of right shoulder   Localized edema     ONSET DATE: 06/13/2021- Surgery   SUBJECTIVE:                                                                                                                                                                                       SUBJECTIVE STATEMENT: Pt states things are going well. He was sore for about Ballard day after last session. He did not have lingering   stiffness following.    PAIN:  Are you having pain? No     PRECAUTIONS: Shoulder, RCR on 06/13/2021   WEIGHT BEARING RESTRICTIONS Yes R RTC repair        OBJECTIVE:      TODAY'S TREATMENT  12/12  Joint mob: posterior and inf R GHJ mob grade III  Manual rhythmic stab 20s 3x Supine protraction w/ slight ER 3x10 2lbs  Supine flexion 2lbs 2x10 5s hold  Rowing RTB 2x10 IR/ER YTB 3x10   Wall slide 5s 15x each ABD and flex     12/5  Pt seen for aquatic therapy today.  Treatment took place in water 3.25-4 ft in depth at the [facility] pool. Temp of water was 91.  Pt entered/exited the pool via stairs (alternating) independently with no rail support.   Warm up: fwd, retro, sidestepping with arm flexion,ext, and abd 3x  Exercise:   standing shoulder extension in half squat 2x10 Standing shoulder extension from 45 to 0 yellow DB 2x10 Standing ER stretch edge of pool 10s 10x Scaption 2x10 ABD palm down 2x10 Standing shoulder flexion stretch on edge of pool 10s 10x Arm circles at 0, 45, and 90 deg ABD 10x each Back against wall push kickboard 2x10    Pt requires buoyancy for support and to offload joints with strengthening exercises. Viscosity of the water is needed for resistance of strengthening; water current perturbations provides challenge to standing balance unsupported, requiring increased core activation.   PATIENT EDUCATION: Education  details: exercise progression, DOMS expectations, muscle firing,  envelope of function, HEP Person educated: Patient and spouse Education method: Explanation, Demonstration, Verbal cues Education comprehension: verbalized understanding, returned demonstration, verbal cues required     HOME EXERCISE PROGRAM: Access Code: [email or web address] URL: [email or web address] Date: 08/15/2021 Prepared by: Lee Ballard  Exercises Standing Shoulder Scaption - 1 x daily - 3-4 x weekly - 3 sets - 10 reps Shoulder Abduction - Thumbs Up - 1 x daily - 3-4 x weekly - 3 sets - 10 reps Shoulder External Rotation with Anchored Resistance - 1 x daily - 3-4 x weekly - 3 sets - 10 reps Shoulder Internal Rotation with Resistance - 1 x daily - 3-4 x weekly - 3 sets - 10 reps Standing Shoulder Row with Anchored Resistance - 1 x daily - 3-4 x weekly - 3 sets - 10 reps Shoulder Flexion Wall Slide with Towel - 1 x daily - 7 x weekly - 1 sets - 15 reps - 5 hold Standing Shoulder Abduction Slides at Wall - 1 x daily - 7 x weekly - 1 sets - 15 reps - 5 hold      ASSESSMENT:   CLINICAL IMPRESSION: Pt able to to progress land based strengthening today to isotonic exercises without issue. Pt with joint stiffness following last session with ABD and flexion but was improved following manual therapy. Plan to continue with gentle strengthening as tolerated. Pt with good ROM but needs cuing for UT compensation. Pt is 8 weeks at this time. Pt will benefit from continued skilled therapy in order to reach goals and maximize functional R UE strength and ROM for full return to PLOF.   Objective impairments include decreased ROM, decreased strength, hypomobility, increased edema, increased fascial restrictions, increased muscle spasms, impaired flexibility, impaired UE functional use, improper body mechanics, postural dysfunction, and pain. These impairments are limiting patient from cleaning, community activity, driving, meal prep,  occupation, laundry, yard work, and shopping.       GOALS:       SHORT TERM GOALS:   STG Name Target Date Goal status  1 Pt will become independent with HEP in order to demonstrate synthesis of PT education.   06/30/2021 MET  2 Pt will demonstrate at least Ballard 15 pt improvement in SPADI in order to demonstrate Ballard clinically significant change in shoulder pain and function     07/28/2021 Plan to take at next session  3 Pt will be able to demonstrate R UE PROM to 140 flexion, 40 ER, and 80  ABD in order to demonstrate functional improvement in UE ROM for transition to muscle activation/strength phase of protocol.    07/28/2021 MET  4 Pt will be able to demonstrate full PROM in all planes in order to demonstrate functional improvement in UE ROM for progression to next phase.    07/28/2021 ongoing    LONG TERM GOALS:    LTG Name Target Date Goal status  1 Pt  will become independent with final HEP in order to demonstrate synthesis of PT education.   09/08/2021 ongoing  2 Pt will be able to demonstrate to perform full AROM in all planes in order to demonstrate functional improvement in UE/LE function for self-care and house hold duties.     09/08/2021 ongoing  3 Pt will be able to reach [place] and carry/hold >3 lbs in order to demonstrate functional improvement in R UE strength for return to ADL and basic household duties.     09/08/2021 ongoing  4 Pt will be able to demonstrate ability to carry 5-10lbs in order to demonstrate functional improvement in UE function for self-care and house hold duties.     09/08/2021 ongoing    PLAN:    PLANNED INTERVENTIONS: Therapeutic exercises, Therapeutic activity, Neuro Muscular re-education, Patient/Family education, Joint mobilization, Aquatic Therapy, Dry Needling, Electrical stimulation, Spinal mobilization, Cryotherapy, Moist heat, scar mobilization, Taping, Vasopneumatic device, Traction, Ultrasound, Ionotophoresis 4mg/ml Dexamethasone, and Manual therapy    PLAN FOR NEXT SESSION: Maintain ROM, cuff strength, reduce OH compensation, periscap strength, scapular depression strength    Lee Ballard PT, DPT 08/15/21 5:37 PM         

## 2021-08-17 ENCOUNTER — Ambulatory Visit (HOSPITAL_BASED_OUTPATIENT_CLINIC_OR_DEPARTMENT_OTHER): Payer: 59 | Admitting: Physical Therapy

## 2021-08-17 ENCOUNTER — Encounter (HOSPITAL_BASED_OUTPATIENT_CLINIC_OR_DEPARTMENT_OTHER): Payer: Self-pay | Admitting: Physical Therapy

## 2021-08-17 ENCOUNTER — Other Ambulatory Visit: Payer: Self-pay | Admitting: Family Medicine

## 2021-08-17 ENCOUNTER — Other Ambulatory Visit: Payer: Self-pay

## 2021-08-17 DIAGNOSIS — M6281 Muscle weakness (generalized): Secondary | ICD-10-CM

## 2021-08-17 DIAGNOSIS — M25611 Stiffness of right shoulder, not elsewhere classified: Secondary | ICD-10-CM

## 2021-08-17 DIAGNOSIS — M25511 Pain in right shoulder: Secondary | ICD-10-CM

## 2021-08-17 DIAGNOSIS — R600 Localized edema: Secondary | ICD-10-CM

## 2021-08-17 NOTE — Therapy (Signed)
OUTPATIENT PHYSICAL THERAPY TREATMENT NOTE   Patient Name: Lee Ballard MRN: [id number] DOB:08-13-1966, 55 y.o., male Today's Date: 08/17/2021  PCP: Laurey Morale, MD REFERRING PROVIDER: Laurey Morale, MD   PT End of Session - 08/17/21 1723     Visit Number 14    Number of Visits 21    Date for PT Re-Evaluation 09/14/21    Authorization Type [employer]    PT Start Time 7517    PT Stop Time 0017    PT Time Calculation (min) 40 min    Activity Tolerance Patient tolerated treatment well;No increased pain    Behavior During Therapy [facility] for tasks assessed/performed                   Past Medical History:  Diagnosis Date   Edema leg    Erectile dysfunction    Family history of premature CAD 09/28/2016   GERD (gastroesophageal reflux disease)    Hyperlipidemia    Hypertension    Varicose vein of leg    Past Surgical History:  Procedure Laterality Date   COLONOSCOPY  04/29/2015   per Dr. Henrene Pastor, hyperplastic polyps, repeat in 10 yrs    ENDOVENOUS ABLATION SAPHENOUS VEIN W/ LASER Right    SHOULDER ARTHROSCOPY WITH ROTATOR CUFF REPAIR AND SUBACROMIAL DECOMPRESSION Right 06/13/2021   Procedure: RIGHT SHOULDER ARTHROSCOPY WITH ROTATOR CUFF REPAIR;  Surgeon: Vanetta Mulders, MD;  Location: [place];  Service: Orthopedics;  Laterality: Right;   TONSILLECTOMY     Patient Active Problem List   Diagnosis Date Noted   Rotator cuff injury, initial encounter    Subacromial bursitis of right shoulder joint    Nutritional counseling 08/20/2020   Superficial thrombophlebitis 01/15/2019   Family history of premature CAD 09/28/2016   Excessive daytime sleepiness 09/28/2016   Hyperglycemia 03/17/2014   Hyperlipidemia 10/08/2009   VARICOSE VEINS, LOWER EXTREMITIES 10/08/2009   Essential hypertension 11/12/2008   EDEMA LEG 11/12/2008   REFERRING PROVIDER: Vanetta Mulders, MD   REFERRING DIAG: S46.011D (ICD-10-CM) - Traumatic complete tear of right rotator cuff,  subsequent encounter    THERAPY DIAG:  Shoulder joint stiffness, right   Muscle weakness (generalized)   Acute pain of right shoulder   Localized edema     ONSET DATE: 06/13/2021- Surgery   SUBJECTIVE:                                                                                                                                                                                       SUBJECTIVE STATEMENT: Pt states he was sore for about a day after last session. No stiffness noted after.   PAIN:  Are you having pain? No     PRECAUTIONS: Shoulder, RCR on 06/13/2021   WEIGHT BEARING RESTRICTIONS Yes R RTC repair        OBJECTIVE:      TODAY'S TREATMENT 12/14  Pt seen for aquatic therapy today.  Treatment took place in water 3.25-4 ft in depth at the [facility] pool. Temp of water was 91.  Pt entered/exited the pool via stairs (alternating) independently with no rail support.   Warm up: fwd, retro, sidestepping with arm flexion,ext, and abd 3x Walking lengths 4x as active recovery  Exercise:   Pool wall push up 3x10 Standing shoulder extension from 45 to 0  and vice versa yellow DB 2x10 Standing ER stretch edge of pool 10s 10x Scaption 2x10 Scap depression yellow DB 3x10 Tricep press down 3x10 board Shoulder ADD palm down yellow DB (fast down, slow up) 2x10 Standing shoulder flexion stretch on edge of pool 10s 10x Arm circles at 45, and 90 deg ABD 10x each Yellow DB Standing board press and row 2x20 Yellow noodle trunk and shoulder rotation 20x    Pt requires buoyancy for support and to offload joints with strengthening exercises. Viscosity of the water is needed for resistance of strengthening; water current perturbations provides challenge to standing balance unsupported, requiring increased core activation.  12/12  Joint mob: posterior and inf R GHJ mob grade III  Manual rhythmic stab 20s 3x Supine protraction w/ slight ER 3x10 2lbs  Supine  flexion 2lbs 2x10 5s hold  Rowing RTB 2x10 IR/ER YTB 3x10   Wall slide 5s 15x each ABD and flex     12/5  Pt seen for aquatic therapy today.  Treatment took place in water 3.25-4 ft in depth at the [facility] pool. Temp of water was 91.  Pt entered/exited the pool via stairs (alternating) independently with no rail support.   Warm up: fwd, retro, sidestepping with arm flexion,ext, and abd 3x  Exercise:   standing shoulder extension in half squat 2x10 Standing shoulder extension from 45 to 0 yellow DB 2x10 Standing ER stretch edge of pool 10s 10x Scaption 2x10 ABD palm down 2x10 Standing shoulder flexion stretch on edge of pool 10s 10x Arm circles at 0, 45, and 90 deg ABD 10x each Back against wall push kickboard 2x10    Pt requires buoyancy for support and to offload joints with strengthening exercises. Viscosity of the water is needed for resistance of strengthening; water current perturbations provides challenge to standing balance unsupported, requiring increased core activation.   PATIENT EDUCATION: Education details: exercise progression, DOMS expectations, muscle firing,  envelope of function, HEP Person educated: Patient and spouse Education method: Explanation, Demonstration, Verbal cues Education comprehension: verbalized understanding, returned demonstration, verbal cues required     HOME EXERCISE PROGRAM: Access Code: [email or web address] URL: [email or web address] Date: 08/15/2021 Prepared by: Daleen Bo  Exercises Standing Shoulder Scaption - 1 x daily - 3-4 x weekly - 3 sets - 10 reps Shoulder Abduction - Thumbs Up - 1 x daily - 3-4 x weekly - 3 sets - 10 reps Shoulder External Rotation with Anchored Resistance - 1 x daily - 3-4 x weekly - 3 sets - 10 reps Shoulder Internal Rotation with Resistance - 1 x daily - 3-4 x weekly - 3 sets - 10 reps Standing Shoulder Row with Anchored Resistance - 1 x daily - 3-4 x weekly - 3 sets - 10 reps Shoulder  Flexion Wall Slide with Towel - 1 x daily - 7 x  weekly - 1 sets - 15 reps - 5 hold Standing Shoulder Abduction Slides at Wall - 1 x daily - 7 x weekly - 1 sets - 15 reps - 5 hold      ASSESSMENT:   CLINICAL IMPRESSION: Pt with good tolerance to progression of intensity in aquatic environment and able to progress to CKC at ~30% BW without pain or discomfort. Pt has minimized UT compensation in reduce gravity environment suggesting continued cuff weakness. Pt likely able to drop to 1x/week in 1 more week due to continued progress/progression. Pt is 8 weeks at this time. Pt will benefit from continued skilled therapy in order to reach goals and maximize functional R UE strength and ROM for full return to PLOF.   Objective impairments include decreased ROM, decreased strength, hypomobility, increased edema, increased fascial restrictions, increased muscle spasms, impaired flexibility, impaired UE functional use, improper body mechanics, postural dysfunction, and pain. These impairments are limiting patient from cleaning, community activity, driving, meal prep, occupation, laundry, yard work, and shopping.       GOALS:     SHORT TERM GOALS:   STG Name Target Date Goal status  1 Pt will become independent with HEP in order to demonstrate synthesis of PT education.   06/30/2021 MET  2 Pt will demonstrate at least a 15 pt improvement in SPADI in order to demonstrate a clinically significant change in shoulder pain and function     07/28/2021 Plan to take at next session  3 Pt will be able to demonstrate R UE PROM to 140 flexion, 40 ER, and 80  ABD in order to demonstrate functional improvement in UE ROM for transition to muscle activation/strength phase of protocol.    07/28/2021 MET  4 Pt will be able to demonstrate full PROM in all planes in order to demonstrate functional improvement in UE ROM for progression to next phase.    07/28/2021 ongoing    LONG TERM GOALS:    LTG Name Target  Date Goal status  1 Pt  will become independent with final HEP in order to demonstrate synthesis of PT education.   09/08/2021 ongoing  2 Pt will be able to demonstrate to perform full AROM in all planes in order to demonstrate functional improvement in UE/LE function for self-care and house hold duties.     09/08/2021 ongoing  3 Pt will be able to reach [facility] and carry/hold >3 lbs in order to demonstrate functional improvement in R UE strength for return to ADL and basic household duties.     09/08/2021 ongoing  4 Pt will be able to demonstrate ability to carry 5-10lbs in order to demonstrate functional improvement in UE function for self-care and house hold duties.     09/08/2021 ongoing    PLAN:    PLANNED INTERVENTIONS: Therapeutic exercises, Therapeutic activity, Neuro Muscular re-education, Patient/Family education, Joint mobilization, Aquatic Therapy, Dry Needling, Electrical stimulation, Spinal mobilization, Cryotherapy, Moist heat, scar mobilization, Taping, Vasopneumatic device, Traction, Ultrasound, Ionotophoresis 34m/ml Dexamethasone, and Manual therapy   PLAN FOR NEXT SESSION: Maintain ROM, cuff strength, reduce OH compensation, periscap strength, scapular depression strength    ADaleen BoPT, DPT 08/17/21 5:31 PM

## 2021-08-22 ENCOUNTER — Ambulatory Visit (HOSPITAL_BASED_OUTPATIENT_CLINIC_OR_DEPARTMENT_OTHER): Payer: 59 | Admitting: Physical Therapy

## 2021-08-22 ENCOUNTER — Other Ambulatory Visit: Payer: Self-pay

## 2021-08-22 ENCOUNTER — Encounter (HOSPITAL_BASED_OUTPATIENT_CLINIC_OR_DEPARTMENT_OTHER): Payer: Self-pay | Admitting: Physical Therapy

## 2021-08-22 DIAGNOSIS — R600 Localized edema: Secondary | ICD-10-CM

## 2021-08-22 DIAGNOSIS — M25611 Stiffness of right shoulder, not elsewhere classified: Secondary | ICD-10-CM

## 2021-08-22 DIAGNOSIS — M6281 Muscle weakness (generalized): Secondary | ICD-10-CM

## 2021-08-22 DIAGNOSIS — M25511 Pain in right shoulder: Secondary | ICD-10-CM

## 2021-08-22 NOTE — Therapy (Signed)
OUTPATIENT PHYSICAL THERAPY TREATMENT NOTE   Patient Name: Lee Ballard MRN: [id number] DOB:1966-01-04, 55 y.o., male Today's Date: 08/22/2021  PCP: Laurey Morale, MD REFERRING PROVIDER: Laurey Morale, MD   PT End of Session - 08/22/21 1637     Visit Number 17    Number of Visits 21    Date for PT Re-Evaluation 09/14/21    Authorization Type U[employer]    PT Start Time 4982    PT Stop Time 1720    PT Time Calculation (min) 43 min    Activity Tolerance Patient tolerated treatment well;No increased pain    Behavior During Therapy [facility] for tasks assessed/performed                    Past Medical History:  Diagnosis Date   Edema leg    Erectile dysfunction    Family history of premature CAD 09/28/2016   GERD (gastroesophageal reflux disease)    Hyperlipidemia    Hypertension    Varicose vein of leg    Past Surgical History:  Procedure Laterality Date   COLONOSCOPY  04/29/2015   per Dr. Henrene Pastor, hyperplastic polyps, repeat in 10 yrs    ENDOVENOUS ABLATION SAPHENOUS VEIN W/ LASER Right    SHOULDER ARTHROSCOPY WITH ROTATOR CUFF REPAIR AND SUBACROMIAL DECOMPRESSION Right 06/13/2021   Procedure: RIGHT SHOULDER ARTHROSCOPY WITH ROTATOR CUFF REPAIR;  Surgeon: Vanetta Mulders, MD;  Location: [address];  Service: Orthopedics;  Laterality: Right;   TONSILLECTOMY     Patient Active Problem List   Diagnosis Date Noted   Rotator cuff injury, initial encounter    Subacromial bursitis of right shoulder joint    Nutritional counseling 08/20/2020   Superficial thrombophlebitis 01/15/2019   Family history of premature CAD 09/28/2016   Excessive daytime sleepiness 09/28/2016   Hyperglycemia 03/17/2014   Hyperlipidemia 10/08/2009   VARICOSE VEINS, LOWER EXTREMITIES 10/08/2009   Essential hypertension 11/12/2008   EDEMA LEG 11/12/2008   REFERRING PROVIDER: Vanetta Mulders, MD   REFERRING DIAG: S46.011D (ICD-10-CM) - Traumatic complete tear of right rotator cuff,  subsequent encounter    THERAPY DIAG:  Shoulder joint stiffness, right   Muscle weakness (generalized)   Acute pain of right shoulder   Localized edema     ONSET DATE: 06/13/2021- Surgery   SUBJECTIVE:                                                                                                                                                                                       SUBJECTIVE STATEMENT: Pt states he was sore for about a day after last session.   PAIN:  Are you having  pain? No     PRECAUTIONS: Shoulder, RCR on 06/13/2021   WEIGHT BEARING RESTRICTIONS Yes R RTC repair        OBJECTIVE:      TODAY'S TREATMENT  12/19  Joint mob: posterior and inf R GHJ mob grade III STM: R deltoid  Manual rhythmic stab 20s 3x Sleeper stretch 30s 2x Wall push up 2x10 OHP 2lbs 3x10 Horizontal ABD YTB 2x10 IR strap stretch 15s 4x Diagonal YTB 6x  Rowing GTB 2x10 IR/ER GTB 2x10     12/14  Pt seen for aquatic therapy today.  Treatment took place in water 3.25-4 ft in depth at the [facility] pool. Temp of water was 91.  Pt entered/exited the pool via stairs (alternating) independently with no rail support.   Warm up: fwd, retro, sidestepping with arm flexion,ext, and abd 3x Walking lengths 4x as active recovery  Exercise:   Pool wall push up 3x10 Standing shoulder extension from 45 to 0  and vice versa yellow DB 2x10 Standing ER stretch edge of pool 10s 10x Scaption 2x10 Scap depression yellow DB 3x10 Tricep press down 3x10 board Shoulder ADD palm down yellow DB (fast down, slow up) 2x10 Standing shoulder flexion stretch on edge of pool 10s 10x Arm circles at 45, and 90 deg ABD 10x each Yellow DB Standing board press and row 2x20 Yellow noodle trunk and shoulder rotation 20x    Pt requires buoyancy for support and to offload joints with strengthening exercises. Viscosity of the water is needed for resistance of strengthening; water current  perturbations provides challenge to standing balance unsupported, requiring increased core activation.  12/12  Joint mob: posterior and inf R GHJ mob grade III  Manual rhythmic stab 20s 3x Supine protraction w/ slight ER 3x10 2lbs  Supine flexion 2lbs 2x10 5s hold  Rowing RTB 2x10 IR/ER YTB 3x10   Wall slide 5s 15x each ABD and flex     12/5  Pt seen for aquatic therapy today.  Treatment took place in water 3.25-4 ft in depth at the [facility] pool. Temp of water was 91.  Pt entered/exited the pool via stairs (alternating) independently with no rail support.   Warm up: fwd, retro, sidestepping with arm flexion,ext, and abd 3x  Exercise:   standing shoulder extension in half squat 2x10 Standing shoulder extension from 45 to 0 yellow DB 2x10 Standing ER stretch edge of pool 10s 10x Scaption 2x10 ABD palm down 2x10 Standing shoulder flexion stretch on edge of pool 10s 10x Arm circles at 0, 45, and 90 deg ABD 10x each Back against wall push kickboard 2x10    Pt requires buoyancy for support and to offload joints with strengthening exercises. Viscosity of the water is needed for resistance of strengthening; water current perturbations provides challenge to standing balance unsupported, requiring increased core activation.   PATIENT EDUCATION: Education details: exercise progression, DOMS expectations, muscle firing,  envelope of function, HEP Person educated: Patient and spouse Education method: Explanation, Demonstration, Verbal cues Education comprehension: verbalized understanding, returned demonstration, verbal cues required     HOME EXERCISE PROGRAM: Access Code: [email or web address] URL: [email or web address] Date: 08/22/2021 Prepared by: Daleen Bo  Exercises Standing Shoulder Scaption - 1 x daily - 3-4 x weekly - 3 sets - 10 reps Shoulder Abduction - Thumbs Up - 1 x daily - 3-4 x weekly - 3 sets - 10 reps Shoulder External Rotation with Anchored  Resistance - 1 x daily - 3-4 x weekly - 3 sets - 10  reps Shoulder Internal Rotation with Resistance - 1 x daily - 3-4 x weekly - 3 sets - 10 reps Standing Shoulder Row with Anchored Resistance - 1 x daily - 3-4 x weekly - 3 sets - 10 reps Shoulder Overhead Press in Flexion with Dumbbells - 1 x daily - 3-4 x weekly - 2 sets - 10 reps Standing Shoulder Internal Rotation Stretch with Towel - 1 x daily - 3-4 x weekly - 1 sets - 3 reps - 30 hold        ASSESSMENT:   CLINICAL IMPRESSION: Pt with good tolerance to progression of land based cuff strengthening. No pain noted with any exercise and able to introduce CKC as well as OH resisted motions without shoulder irritation or significant UT compensation. Pt is 10 weeks at this time. Plan to continue with R cuff strength. HEP updated to improve IR.  Pt will benefit from continued skilled therapy in order to reach goals and maximize functional R UE strength and ROM for full return to PLOF.   Objective impairments include decreased ROM, decreased strength, hypomobility, increased edema, increased fascial restrictions, increased muscle spasms, impaired flexibility, impaired UE functional use, improper body mechanics, postural dysfunction, and pain. These impairments are limiting patient from cleaning, community activity, driving, meal prep, occupation, laundry, yard work, and shopping.       GOALS:     SHORT TERM GOALS:   STG Name Target Date Goal status  1 Pt will become independent with HEP in order to demonstrate synthesis of PT education.   06/30/2021 MET  2 Pt will demonstrate at least a 15 pt improvement in SPADI in order to demonstrate a clinically significant change in shoulder pain and function     07/28/2021 Plan to take at next session  3 Pt will be able to demonstrate R UE PROM to 140 flexion, 40 ER, and 80  ABD in order to demonstrate functional improvement in UE ROM for transition to muscle activation/strength phase of protocol.     07/28/2021 MET  4 Pt will be able to demonstrate full PROM in all planes in order to demonstrate functional improvement in UE ROM for progression to next phase.    07/28/2021 ongoing    LONG TERM GOALS:    LTG Name Target Date Goal status  1 Pt  will become independent with final HEP in order to demonstrate synthesis of PT education.   09/08/2021 ongoing  2 Pt will be able to demonstrate to perform full AROM in all planes in order to demonstrate functional improvement in UE/LE function for self-care and house hold duties.     09/08/2021 ongoing  3 Pt will be able to reach [facility] and carry/hold >3 lbs in order to demonstrate functional improvement in R UE strength for return to ADL and basic household duties.     09/08/2021 ongoing  4 Pt will be able to demonstrate ability to carry 5-10lbs in order to demonstrate functional improvement in UE function for self-care and house hold duties.     09/08/2021 ongoing    PLAN:    PLANNED INTERVENTIONS: Therapeutic exercises, Therapeutic activity, Neuro Muscular re-education, Patient/Family education, Joint mobilization, Aquatic Therapy, Dry Needling, Electrical stimulation, Spinal mobilization, Cryotherapy, Moist heat, scar mobilization, Taping, Vasopneumatic device, Traction, Ultrasound, Ionotophoresis 73m/ml Dexamethasone, and Manual therapy   PLAN FOR NEXT SESSION: Maintain ROM, cuff strength, reduce OH compensation, periscap strength, scapular depression strength    ADaleen BoPT, DPT 08/22/21 5:27 PM

## 2021-08-24 ENCOUNTER — Encounter (HOSPITAL_BASED_OUTPATIENT_CLINIC_OR_DEPARTMENT_OTHER): Payer: Self-pay | Admitting: Physical Therapy

## 2021-08-24 ENCOUNTER — Other Ambulatory Visit: Payer: Self-pay

## 2021-08-24 ENCOUNTER — Ambulatory Visit (HOSPITAL_BASED_OUTPATIENT_CLINIC_OR_DEPARTMENT_OTHER): Payer: 59 | Admitting: Physical Therapy

## 2021-08-24 DIAGNOSIS — M25611 Stiffness of right shoulder, not elsewhere classified: Secondary | ICD-10-CM | POA: Diagnosis not present

## 2021-08-24 DIAGNOSIS — M6281 Muscle weakness (generalized): Secondary | ICD-10-CM

## 2021-08-24 DIAGNOSIS — M25511 Pain in right shoulder: Secondary | ICD-10-CM

## 2021-08-24 NOTE — Therapy (Signed)
OUTPATIENT PHYSICAL THERAPY TREATMENT NOTE   Patient Name: Lee Ballard MRN: [id number] DOB:09-Jan-1966, 55 y.o., male Today's Date: 08/24/2021  PCP: Laurey Morale, MD REFERRING PROVIDER: Laurey Morale, MD   PT End of Session - 08/24/21 1657     Visit Number 18    Number of Visits 21    Date for PT Re-Evaluation 09/14/21    Authorization Type UHC    PT Start Time 5643    PT Stop Time 3295    PT Time Calculation (min) 40 min    Activity Tolerance Patient tolerated treatment well;No increased pain    Behavior During Therapy [facility] for tasks assessed/performed                     Past Medical History:  Diagnosis Date   Edema leg    Erectile dysfunction    Family history of premature CAD 09/28/2016   GERD (gastroesophageal reflux disease)    Hyperlipidemia    Hypertension    Varicose vein of leg    Past Surgical History:  Procedure Laterality Date   COLONOSCOPY  04/29/2015   per Dr. Henrene Pastor, hyperplastic polyps, repeat in 10 yrs    ENDOVENOUS ABLATION SAPHENOUS VEIN W/ LASER Right    SHOULDER ARTHROSCOPY WITH ROTATOR CUFF REPAIR AND SUBACROMIAL DECOMPRESSION Right 06/13/2021   Procedure: RIGHT SHOULDER ARTHROSCOPY WITH ROTATOR CUFF REPAIR;  Surgeon: Vanetta Mulders, MD;  Location: [place];  Service: Orthopedics;  Laterality: Right;   TONSILLECTOMY     Patient Active Problem List   Diagnosis Date Noted   Rotator cuff injury, initial encounter    Subacromial bursitis of right shoulder joint    Nutritional counseling 08/20/2020   Superficial thrombophlebitis 01/15/2019   Family history of premature CAD 09/28/2016   Excessive daytime sleepiness 09/28/2016   Hyperglycemia 03/17/2014   Hyperlipidemia 10/08/2009   VARICOSE VEINS, LOWER EXTREMITIES 10/08/2009   Essential hypertension 11/12/2008   EDEMA LEG 11/12/2008   REFERRING PROVIDER: Vanetta Mulders, MD   REFERRING DIAG: S46.011D (ICD-10-CM) - Traumatic complete tear of right rotator  cuff, subsequent encounter    THERAPY DIAG:  Shoulder joint stiffness, right   Muscle weakness (generalized)   Acute pain of right shoulder   Localized edema     ONSET DATE: 06/13/2021- Surgery   SUBJECTIVE:                                                                                                                                                                                       SUBJECTIVE STATEMENT: Pt states he had no issues from last session. Just ant shoulder soreness.    PAIN:  Are you having pain? No     PRECAUTIONS: Shoulder, RCR on 06/13/2021   WEIGHT BEARING RESTRICTIONS Yes R RTC repair        OBJECTIVE:      TODAY'S TREATMENT  12/21  Pt seen for aquatic therapy today.  Treatment took place in water 3.25-4 ft in depth at the [facility] pool. Temp of water was 93.  Pt entered/exited the pool via stairs (alternating) independently with no rail support.   Warm up: fwd, sidestepping with arm flexion,ext, and abd 3x Walking lengths 4x as active recovery   Exercise:   Pool wall push up 3x10 Scap push up 3x10 Standing shoulder extension from 45 to 0  and vice versa yellow DB 2x20 Standing ER and flexion stretch edge of pool 10s 10x Scap depression yellow DB 2x10 Tricep press down 2x10 yellow DB Shoulder ADD palm down yellow DB (fast down, slow up) 2x10 Standing shoulder flexion stretch on edge of pool 10s 10x Arm circles at 45, and 90 deg ABD 20x each Yellow DB Standing board press and row 2x20 Standing board trunk and shoulder rotation 20x    Pt requires buoyancy for support and to offload joints with strengthening exercises. Viscosity of the water is needed for resistance of strengthening; water current perturbations provides challenge to standing balance unsupported, requiring increased core activation.  12/19  Joint mob: posterior and inf R GHJ mob grade III STM: R deltoid  Manual rhythmic stab 20s 3x Sleeper stretch 30s  2x Wall push up 2x10 OHP 2lbs 3x10 Horizontal ABD YTB 2x10 IR strap stretch 15s 4x Diagonal YTB 6x  Rowing GTB 2x10 IR/ER GTB 2x10     12/14  Pt seen for aquatic therapy today.  Treatment took place in water 3.25-4 ft in depth at the [facility] pool. Temp of water was 91.  Pt entered/exited the pool via stairs (alternating) independently with no rail support.   Warm up: fwd, retro, sidestepping with arm flexion,ext, and abd 3x Walking lengths 4x as active recovery  Exercise:   Pool wall push up 3x10 Standing shoulder extension from 45 to 0  and vice versa yellow DB 2x10 Standing ER stretch edge of pool 10s 10x Scaption 2x10 Scap depression yellow DB 3x10 Tricep press down 3x10 board Shoulder ADD palm down yellow DB (fast down, slow up) 2x10 Standing shoulder flexion stretch on edge of pool 10s 10x Arm circles at 45, and 90 deg ABD 10x each Yellow DB Standing board press and row 2x20 Yellow noodle trunk and shoulder rotation 20x    Pt requires buoyancy for support and to offload joints with strengthening exercises. Viscosity of the water is needed for resistance of strengthening; water current perturbations provides challenge to standing balance unsupported, requiring increased core activation.  12/12  Joint mob: posterior and inf R GHJ mob grade III  Manual rhythmic stab 20s 3x Supine protraction w/ slight ER 3x10 2lbs  Supine flexion 2lbs 2x10 5s hold  Rowing RTB 2x10 IR/ER YTB 3x10   Wall slide 5s 15x each ABD and flex      PATIENT EDUCATION: Education details: exercise progression, muscle firing,  envelope of function, HEP Person educated: Patient and spouse Education method: Explanation, Demonstration, Verbal cues Education comprehension: verbalized understanding, returned demonstration, verbal cues required     HOME EXERCISE PROGRAM: Access Code: [email or web address] URL: [email or web address] Date: 08/22/2021 Prepared by: Daleen Bo  Exercises Standing Shoulder Scaption - 1 x daily - 3-4 x weekly - 3 sets - 10  reps Shoulder Abduction - Thumbs Up - 1 x daily - 3-4 x weekly - 3 sets - 10 reps Shoulder External Rotation with Anchored Resistance - 1 x daily - 3-4 x weekly - 3 sets - 10 reps Shoulder Internal Rotation with Resistance - 1 x daily - 3-4 x weekly - 3 sets - 10 reps Standing Shoulder Row with Anchored Resistance - 1 x daily - 3-4 x weekly - 3 sets - 10 reps Shoulder Overhead Press in Flexion with Dumbbells - 1 x daily - 3-4 x weekly - 2 sets - 10 reps Standing Shoulder Internal Rotation Stretch with Towel - 1 x daily - 3-4 x weekly - 1 sets - 3 reps - 30 hold        ASSESSMENT:   CLINICAL IMPRESSION: Session used to progress muscle endurance of R shoulder with increased volume. Pt tolerated well without pain exacerbation. Plan to add more CKC in the pool setting and progress at next week's session. Likely able to stop pool sessions after 1-2x visits, able to transition to full land based strength in 1-2 weeks. Pt has built good endurance capacity within aquatic setting. Likely does not need plyometric activity in pool setting.  Pt will benefit from continued skilled therapy in order to reach goals and maximize functional R UE strength and ROM for full return to PLOF.   Objective impairments include decreased ROM, decreased strength, hypomobility, increased edema, increased fascial restrictions, increased muscle spasms, impaired flexibility, impaired UE functional use, improper body mechanics, postural dysfunction, and pain. These impairments are limiting patient from cleaning, community activity, driving, meal prep, occupation, laundry, yard work, and shopping.       GOALS:     SHORT TERM GOALS:   STG Name Target Date Goal status  1 Pt will become independent with HEP in order to demonstrate synthesis of PT education.   06/30/2021 MET  2 Pt will demonstrate at least a 15 pt improvement in SPADI in  order to demonstrate a clinically significant change in shoulder pain and function     07/28/2021 Plan to take at next session  3 Pt will be able to demonstrate R UE PROM to 140 flexion, 40 ER, and 80  ABD in order to demonstrate functional improvement in UE ROM for transition to muscle activation/strength phase of protocol.    07/28/2021 MET  4 Pt will be able to demonstrate full PROM in all planes in order to demonstrate functional improvement in UE ROM for progression to next phase.    07/28/2021 ongoing    LONG TERM GOALS:    LTG Name Target Date Goal status  1 Pt  will become independent with final HEP in order to demonstrate synthesis of PT education.   09/08/2021 ongoing  2 Pt will be able to demonstrate to perform full AROM in all planes in order to demonstrate functional improvement in UE/LE function for self-care and house hold duties.     09/08/2021 ongoing  3 Pt will be able to reach [facility] and carry/hold >3 lbs in order to demonstrate functional improvement in R UE strength for return to ADL and basic household duties.     09/08/2021 ongoing  4 Pt will be able to demonstrate ability to carry 5-10lbs in order to demonstrate functional improvement in UE function for self-care and house hold duties.     09/08/2021 ongoing    PLAN:    PLANNED INTERVENTIONS: Therapeutic exercises, Therapeutic activity, Neuro Muscular re-education, Patient/Family education, Joint mobilization, Aquatic  Therapy, Dry Needling, Electrical stimulation, Spinal mobilization, Cryotherapy, Moist heat, scar mobilization, Taping, Vasopneumatic device, Traction, Ultrasound, Ionotophoresis 12m/ml Dexamethasone, and Manual therapy   PLAN FOR NEXT SESSION: Maintain ROM, cuff strength, reduce OH compensation, periscap strength, scapular depression strength    ADaleen BoPT, DPT 08/24/21 5:25 PM

## 2021-08-30 ENCOUNTER — Other Ambulatory Visit (HOSPITAL_BASED_OUTPATIENT_CLINIC_OR_DEPARTMENT_OTHER): Payer: Self-pay

## 2021-08-30 ENCOUNTER — Ambulatory Visit (HOSPITAL_BASED_OUTPATIENT_CLINIC_OR_DEPARTMENT_OTHER): Payer: 59 | Admitting: Physical Therapy

## 2021-08-30 ENCOUNTER — Other Ambulatory Visit: Payer: Self-pay

## 2021-08-30 ENCOUNTER — Ambulatory Visit (INDEPENDENT_AMBULATORY_CARE_PROVIDER_SITE_OTHER): Payer: Self-pay | Admitting: Family Medicine

## 2021-08-30 ENCOUNTER — Encounter: Payer: Self-pay | Admitting: Family Medicine

## 2021-08-30 VITALS — BP 132/90 | HR 92 | Temp 98.2°F | Ht 73.0 in | Wt 301.0 lb

## 2021-08-30 DIAGNOSIS — R799 Abnormal finding of blood chemistry, unspecified: Secondary | ICD-10-CM | POA: Diagnosis not present

## 2021-08-30 DIAGNOSIS — M25611 Stiffness of right shoulder, not elsewhere classified: Secondary | ICD-10-CM

## 2021-08-30 DIAGNOSIS — Z23 Encounter for immunization: Secondary | ICD-10-CM | POA: Diagnosis not present

## 2021-08-30 DIAGNOSIS — Z0000 Encounter for general adult medical examination without abnormal findings: Secondary | ICD-10-CM | POA: Diagnosis not present

## 2021-08-30 DIAGNOSIS — M6281 Muscle weakness (generalized): Secondary | ICD-10-CM

## 2021-08-30 DIAGNOSIS — M25511 Pain in right shoulder: Secondary | ICD-10-CM

## 2021-08-30 LAB — BASIC METABOLIC PANEL
BUN: 17 mg/dL (ref 6–23)
CO2: 27 mEq/L (ref 19–32)
Calcium: 9.9 mg/dL (ref 8.4–10.5)
Chloride: 102 mEq/L (ref 96–112)
Creatinine, Ser: 1.01 mg/dL (ref 0.40–1.50)
GFR: 82.71 mL/min (ref >60.00)
Glucose, Bld: 127 mg/dL — ABNORMAL HIGH (ref 70–99)
Potassium: 4.2 mEq/L (ref 3.5–5.1)
Sodium: 138 mEq/L (ref 135–145)

## 2021-08-30 LAB — LIPID PANEL
Cholesterol: 181 mg/dL (ref 0–200)
HDL: 33.3 mg/dL — ABNORMAL LOW (ref >39.00)
NonHDL: 147.38
Total CHOL/HDL Ratio: 5
Triglycerides: 345 mg/dL — ABNORMAL HIGH (ref 0.0–149.0)
VLDL: 69 mg/dL — ABNORMAL HIGH (ref 0.0–40.0)

## 2021-08-30 LAB — CBC WITH DIFFERENTIAL/PLATELET
Basophils Absolute: 0.1 10*3/uL (ref 0.0–0.1)
Basophils Relative: 0.7 % (ref 0.0–3.0)
Eosinophils Absolute: 0.3 10*3/uL (ref 0.0–0.7)
Eosinophils Relative: 2.9 % (ref 0.0–5.0)
HCT: 47.9 % (ref 39.0–52.0)
Hemoglobin: 16.1 g/dL (ref 13.0–17.0)
Lymphocytes Relative: 23.5 % (ref 12.0–46.0)
Lymphs Abs: 2.1 10*3/uL (ref 0.7–4.0)
MCHC: 33.7 g/dL (ref 30.0–36.0)
MCV: 91.8 fl (ref 78.0–100.0)
Monocytes Absolute: 0.7 10*3/uL (ref 0.1–1.0)
Monocytes Relative: 7.7 % (ref 3.0–12.0)
Neutro Abs: 5.9 10*3/uL (ref 1.4–7.7)
Neutrophils Relative %: 65.2 % (ref 43.0–77.0)
Platelets: 305 10*3/uL (ref 150.0–400.0)
RBC: 5.22 Mil/uL (ref 4.22–5.81)
RDW: 14.4 % (ref 11.5–15.5)
WBC: 9.1 10*3/uL (ref 4.0–10.5)

## 2021-08-30 LAB — HEPATIC FUNCTION PANEL
ALT: 63 U/L — ABNORMAL HIGH (ref 0–53)
AST: 47 U/L — ABNORMAL HIGH (ref 0–37)
Albumin: 4.4 g/dL (ref 3.5–5.2)
Alkaline Phosphatase: 124 U/L — ABNORMAL HIGH (ref 39–117)
Bilirubin, Direct: 0.1 mg/dL (ref 0.0–0.3)
Total Bilirubin: 0.7 mg/dL (ref 0.2–1.2)
Total Protein: 7 g/dL (ref 6.0–8.3)

## 2021-08-30 LAB — PSA: PSA: 0.71 ng/mL (ref 0.10–4.00)

## 2021-08-30 LAB — LDL CHOLESTEROL, DIRECT: Direct LDL: 125 mg/dL

## 2021-08-30 LAB — TSH: TSH: 3.74 u[IU]/mL (ref 0.35–5.50)

## 2021-08-30 LAB — HEMOGLOBIN A1C: Hgb A1c MFr Bld: 6.6 % — ABNORMAL HIGH (ref 4.6–6.5)

## 2021-08-30 MED ORDER — LISINOPRIL 30 MG PO TABS
ORAL_TABLET | ORAL | 3 refills | Status: DC
  Filled 2021-08-30: qty 90, 90d supply, fill #0
  Filled 2021-11-18: qty 90, 90d supply, fill #1
  Filled 2022-03-13: qty 30, 30d supply, fill #2
  Filled 2022-04-09: qty 30, 30d supply, fill #3
  Filled 2022-05-09: qty 10, 10d supply, fill #4
  Filled 2022-05-09: qty 20, 20d supply, fill #4

## 2021-08-30 MED ORDER — OMEPRAZOLE 40 MG PO CPDR
40.0000 mg | DELAYED_RELEASE_CAPSULE | Freq: Every day | ORAL | 3 refills | Status: DC
  Filled 2021-08-30: qty 90, 90d supply, fill #0
  Filled 2021-11-18: qty 90, 90d supply, fill #1
  Filled 2022-03-13: qty 90, 90d supply, fill #2
  Filled 2022-06-17: qty 90, 90d supply, fill #3

## 2021-08-30 MED ORDER — SIMVASTATIN 40 MG PO TABS
ORAL_TABLET | ORAL | 3 refills | Status: DC
  Filled 2021-08-30 – 2021-11-18 (×2): qty 90, 90d supply, fill #0

## 2021-08-30 NOTE — Therapy (Signed)
OUTPATIENT PHYSICAL THERAPY TREATMENT NOTE   Patient Name: Lee Ballard MRN: [id number] DOB:[birth year], 55 y.o., male Today's Date: 08/31/2021  PCP: Laurey Morale, MD  PT End of Session - 08/30/21 1700     Visit Number 19    Number of Visits 21    Date for PT Re-Evaluation 09/14/21    Authorization Type UHC    PT Start Time 7482    PT Stop Time 1656    PT Time Calculation (min) 41 min    Activity Tolerance Patient tolerated treatment well    Behavior During Therapy [facility] for tasks assessed/performed                      Past Medical History:  Diagnosis Date   Edema leg    Erectile dysfunction    Family history of premature CAD 09/28/2016   GERD (gastroesophageal reflux disease)    Hyperlipidemia    Hypertension    Varicose vein of leg    Past Surgical History:  Procedure Laterality Date   COLONOSCOPY  04/29/2015   per Dr. Henrene Pastor, hyperplastic polyps, repeat in 10 yrs    ENDOVENOUS ABLATION SAPHENOUS VEIN W/ LASER Right    SHOULDER ARTHROSCOPY WITH ROTATOR CUFF REPAIR AND SUBACROMIAL DECOMPRESSION Right 06/13/2021   Procedure: RIGHT SHOULDER ARTHROSCOPY WITH ROTATOR CUFF REPAIR;  Surgeon: Vanetta Mulders, MD;  Location: [place];  Service: Orthopedics;  Laterality: Right;   TONSILLECTOMY     Patient Active Problem List   Diagnosis Date Noted   Rotator cuff injury, initial encounter    Subacromial bursitis of right shoulder joint    Nutritional counseling 08/20/2020   Superficial thrombophlebitis 01/15/2019   Family history of premature CAD 09/28/2016   Excessive daytime sleepiness 09/28/2016   Hyperglycemia 03/17/2014   Hyperlipidemia 10/08/2009   VARICOSE VEINS, LOWER EXTREMITIES 10/08/2009   Essential hypertension 11/12/2008   EDEMA LEG 11/12/2008   REFERRING PROVIDER: Vanetta Mulders, MD   REFERRING DIAG: S46.011D (ICD-10-CM) - Traumatic complete tear of right rotator cuff, subsequent encounter    THERAPY DIAG:  Shoulder  joint stiffness, right   Muscle weakness (generalized)   Acute pain of right shoulder   Localized edema     ONSET DATE: 06/13/2021- Surgery   SUBJECTIVE:                                                                                                                                                                                       SUBJECTIVE STATEMENT: Pt denies any adverse effects after prior Rx.  Pt reports he typically has some soreness after the pool Rx.   Pt denies pain currently.  Pt reports improved shoulder mobility including overhead.   Pt is 11 weeks and 1 day post op.     PAIN:  Are you having pain? No     PRECAUTIONS: Shoulder, RCR on 06/13/2021   WEIGHT BEARING RESTRICTIONS Yes R RTC repair        OBJECTIVE:      TODAY'S TREATMENT    Manual Therapy: Pt received Grade II-III inferior and posterior Joint mob: posterior and inf R GHJ mob grade III f/b R shoulder PROM in flexion, abd, scaption, ER, and IR in supine.  Therapeutic Exercise: Manual rhythmic stab at 90 deg flexion 30s 3x Prone hz abd 2x10 reps Wall slide 5s 10x each ABD and flex Standing scaption 2x10 reps Rowing GTB 2x10 IR/ER RTB 2x10 IR strap stretch 10s 5x          PATIENT EDUCATION: Education details: exercise form and HEP Person educated: Patient  Education method: Consulting civil engineer, Media planner, Verbal cues Education comprehension: verbalized understanding, returned demonstration, verbal cues required     HOME EXERCISE PROGRAM: Access Code: [email or web address] URL: [email or web address] Date: 08/22/2021 Prepared by: Daleen Bo  Exercises Standing Shoulder Scaption - 1 x daily - 3-4 x weekly - 3 sets - 10 reps Shoulder Abduction - Thumbs Up - 1 x daily - 3-4 x weekly - 3 sets - 10 reps Shoulder External Rotation with Anchored Resistance - 1 x daily - 3-4 x weekly - 3 sets - 10 reps Shoulder Internal Rotation with Resistance - 1 x daily - 3-4 x weekly - 3 sets - 10  reps Standing Shoulder Row with Anchored Resistance - 1 x daily - 3-4 x weekly - 3 sets - 10 reps Shoulder Overhead Press in Flexion with Dumbbells - 1 x daily - 3-4 x weekly - 2 sets - 10 reps Standing Shoulder Internal Rotation Stretch with Towel - 1 x daily - 3-4 x weekly - 1 sets - 3 reps - 30 hold        ASSESSMENT:   CLINICAL IMPRESSION:   Pt is progressing well with L shoulder ROM, strength, function, and protocol.  Pt is able to reach overhead well.  Pt performed exercises well with cuing for correct form.  Pt responded well to Rx having no c/o's and reporting "maybe a 1/10" on pain scale.  Pt should benefit from continued skilled therapy in order to address ongoing goals and maximize functional R UE strength and ROM to assist in returning to PLOF.    Objective impairments include decreased ROM, decreased strength, hypomobility, increased edema, increased fascial restrictions, increased muscle spasms, impaired flexibility, impaired UE functional use, improper body mechanics, postural dysfunction, and pain. These impairments are limiting patient from cleaning, community activity, driving, meal prep, occupation, laundry, yard work, and shopping.       GOALS:     SHORT TERM GOALS:   STG Name Target Date Goal status  1 Pt will become independent with HEP in order to demonstrate synthesis of PT education.   06/30/2021 MET  2 Pt will demonstrate at least a 15 pt improvement in SPADI in order to demonstrate a clinically significant change in shoulder pain and function     07/28/2021 Plan to take at next session  3 Pt will be able to demonstrate R UE PROM to 140 flexion, 40 ER, and 80  ABD in order to demonstrate functional improvement in UE ROM for transition to muscle activation/strength phase of protocol.    07/28/2021 MET  4 Pt will be able  to demonstrate full PROM in all planes in order to demonstrate functional improvement in UE ROM for progression to next phase.     07/28/2021 ongoing    LONG TERM GOALS:    LTG Name Target Date Goal status  1 Pt  will become independent with final HEP in order to demonstrate synthesis of PT education.   09/08/2021 ongoing  2 Pt will be able to demonstrate to perform full AROM in all planes in order to demonstrate functional improvement in UE/LE function for self-care and house hold duties.     09/08/2021 ongoing  3 Pt will be able to reach [facility] and carry/hold >3 lbs in order to demonstrate functional improvement in R UE strength for return to ADL and basic household duties.     09/08/2021 ongoing  4 Pt will be able to demonstrate ability to carry 5-10lbs in order to demonstrate functional improvement in UE function for self-care and house hold duties.     09/08/2021 ongoing    PLAN:    PLANNED INTERVENTIONS: Therapeutic exercises, Therapeutic activity, Neuro Muscular re-education, Patient/Family education, Joint mobilization, Aquatic Therapy, Dry Needling, Electrical stimulation, Spinal mobilization, Cryotherapy, Moist heat, scar mobilization, Taping, Vasopneumatic device, Traction, Ultrasound, Ionotophoresis 80m/ml Dexamethasone, and Manual therapy   PLAN FOR NEXT SESSION: Maintain ROM, cuff strength, reduce OH compensation, periscap strength, scapular depression strength    RSelinda MichaelsIII PT, DPT 08/31/21 2:17 PM

## 2021-08-30 NOTE — Progress Notes (Signed)
° °  Subjective:    Patient ID: Lee Ballard, male    DOB: December 23, 1965, 55 y.o.   MRN: [id number]  HPI Here for a well exam. He feels well. His rehab from recent rotator cuff surgery is going very well. His BP at home averages in the 130s over 80s.    Review of Systems  Constitutional: Negative.   HENT: Negative.    Eyes: Negative.   Respiratory: Negative.    Cardiovascular: Negative.   Gastrointestinal: Negative.   Genitourinary: Negative.   Musculoskeletal: Negative.   Skin: Negative.   Neurological: Negative.   Psychiatric/Behavioral: Negative.        Objective:   Physical Exam Constitutional:      General: He is not in acute distress.    Appearance: Normal appearance. He is well-developed. He is not diaphoretic.  HENT:     Head: Normocephalic and atraumatic.     Right Ear: External ear normal.     Left Ear: External ear normal.     Nose: Nose normal.     Mouth/Throat:     Pharynx: No oropharyngeal exudate.  Eyes:     General: No scleral icterus.       Right eye: No discharge.        Left eye: No discharge.     Conjunctiva/sclera: Conjunctivae normal.     Pupils: Pupils are equal, round, and reactive to light.  Neck:     Thyroid: No thyromegaly.     Vascular: No JVD.     Trachea: No tracheal deviation.  Cardiovascular:     Rate and Rhythm: Normal rate and regular rhythm.     Heart sounds: Normal heart sounds. No murmur heard.   No friction rub. No gallop.  Pulmonary:     Effort: Pulmonary effort is normal. No respiratory distress.     Breath sounds: Normal breath sounds. No wheezing or rales.  Chest:     Chest wall: No tenderness.  Abdominal:     General: Bowel sounds are normal. There is no distension.     Palpations: Abdomen is soft. There is no mass.     Tenderness: There is no abdominal tenderness. There is no guarding or rebound.  Genitourinary:    Penis: Normal. No tenderness.      Testes: Normal.     Prostate: Normal.     Rectum: Normal. Guaiac  result negative.  Musculoskeletal:        General: No tenderness. Normal range of motion.     Cervical back: Neck supple.  Lymphadenopathy:     Cervical: No cervical adenopathy.  Skin:    General: Skin is warm and dry.     Coloration: Skin is not pale.     Findings: No erythema or rash.  Neurological:     Mental Status: He is alert and oriented to person, place, and time.     Cranial Nerves: No cranial nerve deficit.     Motor: No abnormal muscle tone.     Coordination: Coordination normal.     Deep Tendon Reflexes: Reflexes are normal and symmetric. Reflexes normal.  Psychiatric:        Behavior: Behavior normal.        Thought Content: Thought content normal.        Judgment: Judgment normal.          Assessment & Plan:  Well exam. We discussed diet and exercise. Get fasting labs.  Alysia Penna, MD

## 2021-08-31 ENCOUNTER — Encounter (HOSPITAL_BASED_OUTPATIENT_CLINIC_OR_DEPARTMENT_OTHER): Payer: Self-pay | Admitting: Physical Therapy

## 2021-09-06 ENCOUNTER — Ambulatory Visit (INDEPENDENT_AMBULATORY_CARE_PROVIDER_SITE_OTHER): Payer: 59 | Admitting: Orthopaedic Surgery

## 2021-09-06 ENCOUNTER — Other Ambulatory Visit: Payer: Self-pay

## 2021-09-06 DIAGNOSIS — S46011D Strain of muscle(s) and tendon(s) of the rotator cuff of right shoulder, subsequent encounter: Secondary | ICD-10-CM

## 2021-09-06 NOTE — Progress Notes (Signed)
Post Operative Evaluation    Procedure/Date of Surgery: 06/13/21 right shoulder rotator cuff repair with subscapularis repair  Interval History:   Lee Ballard presents today status post right shoulder rotator cuff repair 7 weeks out overall doing extremely well.  Range of motion is essentially full at this time.  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  He has been working on bands and is now advanced to the green band.   PMH/PSH/Family History/Social History/Meds/Allergies:    Past Medical History:  Diagnosis Date   Edema leg    Erectile dysfunction    Family history of premature CAD 09/28/2016   GERD (gastroesophageal reflux disease)    Hyperlipidemia    Hypertension    Varicose vein of leg    Past Surgical History:  Procedure Laterality Date   COLONOSCOPY  04/29/2015   per Dr. Henrene Pastor, hyperplastic polyps, repeat in 10 yrs    ENDOVENOUS ABLATION SAPHENOUS VEIN W/ LASER Right    SHOULDER ARTHROSCOPY WITH ROTATOR CUFF REPAIR AND SUBACROMIAL DECOMPRESSION Right 06/13/2021   Procedure: RIGHT SHOULDER ARTHROSCOPY WITH ROTATOR CUFF REPAIR;  Surgeon: Vanetta Mulders, MD;  Location: [place];  Service: Orthopedics;  Laterality: Right;   TONSILLECTOMY     Social History   Socioeconomic History   Marital status: Divorced    Spouse name: Not on file   Number of children: Not on file   Years of education: Not on file   Highest education level: Not on file  Occupational History   Not on file  Tobacco Use   Smoking status: Never   Smokeless tobacco: Never  Substance and Sexual Activity   Alcohol use: No    Alcohol/week: 0.0 standard drinks   Drug use: No   Sexual activity: Not on file  Other Topics Concern   Not on file  Social History Narrative   Not on file   Social Determinants of Health   Financial Resource Strain: Not on file  Food Insecurity: Not on file  Transportation Needs: Not on file  Physical Activity:  Not on file  Stress: Not on file  Social Connections: Not on file   Family History  Problem Relation Age of Onset   Cancer Mother    Heart attack Father 4   Colon cancer Neg Hx    No Known Allergies Current Outpatient Medications  Medication Sig Dispense Refill   aspirin EC 81 MG tablet Take 1 tablet (81 mg total) by mouth daily. 90 tablet 3   Calcium 200 MG TABS Take 400 mg by mouth daily.     ibuprofen (ADVIL,MOTRIN) 200 MG tablet Take 200 mg by mouth every 6 (six) hours as needed.     lisinopril (ZESTRIL) 30 MG tablet TAKE 1 TABLET BY MOUTH DAILY. (PLEASE MAKE YEARLY APPT WITH DR. Radford Pax FOR JAN FOR FUTURE REFILLS) 90 tablet 3   Multiple Vitamins-Minerals (MULTIVITAMIN,TX-MINERALS) tablet Take 1 tablet by mouth daily.       Omega-3 Fatty Acids (FISH OIL) 1200 MG CAPS Take 1,200 mg by mouth.     omeprazole (PRILOSEC) 40 MG capsule Take 1 capsule (40 mg total) by mouth daily. 90 capsule 3   POTASSIUM PO Take 400 mg by mouth daily.     simvastatin (ZOCOR) 40 MG tablet TAKE 1 TABLET BY MOUTH EVERYDAY AT BEDTIME 90 tablet  3   vardenafil (LEVITRA) 20 MG tablet Take 1 tablet (20 mg total) by mouth as needed for erectile dysfunction. 3 tablet 11   No current facility-administered medications for this visit.   No results found.  Review of Systems:   A ROS was performed including pertinent positives and negatives as documented in the HPI.   Musculoskeletal Exam:    There were no vitals taken for this visit.  Right shoulder incisions are well-healed.  Active forward elevation is equal to contralateral side of 170 degrees.  External rotation at the side is 70 degrees equal to the contralateral side.  Internal rotation is to L1 bilaterally.  He does have strength with pushoff with the right arm behind the back  Imaging:    None  I personally reviewed and interpreted the radiographs.   Assessment:   56 year old male 7 weeks status post right shoulder rotator cuff repair.  Overall  he is doing extremely well.  Denies that he can continue to advance strengthening.  At this time I would not provide any specific restrictions on him.  I have advised that as he begins to progress with strengthening I would encourage him to do anything with 3 sets of 10 reps.  This should be done easily.  He should only advance once he is comfortable doing this.  He understands Plan :    -Return to clinic in 8 weeks    I personally saw and evaluated the patient, and participated in the management and treatment plan.  Vanetta Mulders, MD Attending Physician, Orthopedic Surgery  This document was dictated using Dragon voice recognition software. A reasonable attempt at proof reading has been made to minimize errors.

## 2021-09-07 ENCOUNTER — Telehealth: Payer: Self-pay | Admitting: Cardiology

## 2021-09-07 NOTE — Telephone Encounter (Signed)
LVM advising pt that appt on 09/15/21 has been switched to virtual. Will send reminder letter and MyChart message.

## 2021-09-08 ENCOUNTER — Ambulatory Visit (HOSPITAL_BASED_OUTPATIENT_CLINIC_OR_DEPARTMENT_OTHER): Payer: 59 | Attending: Orthopaedic Surgery | Admitting: Physical Therapy

## 2021-09-08 ENCOUNTER — Other Ambulatory Visit: Payer: Self-pay

## 2021-09-08 ENCOUNTER — Encounter (HOSPITAL_BASED_OUTPATIENT_CLINIC_OR_DEPARTMENT_OTHER): Payer: Self-pay | Admitting: Physical Therapy

## 2021-09-08 DIAGNOSIS — M25611 Stiffness of right shoulder, not elsewhere classified: Secondary | ICD-10-CM | POA: Insufficient documentation

## 2021-09-08 DIAGNOSIS — M25511 Pain in right shoulder: Secondary | ICD-10-CM | POA: Insufficient documentation

## 2021-09-08 DIAGNOSIS — M6281 Muscle weakness (generalized): Secondary | ICD-10-CM | POA: Insufficient documentation

## 2021-09-08 DIAGNOSIS — R600 Localized edema: Secondary | ICD-10-CM | POA: Diagnosis present

## 2021-09-08 NOTE — Therapy (Signed)
OUTPATIENT PHYSICAL THERAPY RE-CERTIFICATION NOTE   Patient Name: Ryle Buscemi MRN: [id number] DOB:13-Nov-1965, 56 y.o., male Today's Date: 09/08/2021  PCP: Laurey Morale, MD  PT End of Session - 09/08/21 1614     Visit Number 20    Number of Visits 26    Date for PT Re-Evaluation 12/07/21    Authorization Type [employer]    PT Start Time 1600    PT Stop Time 6599    PT Time Calculation (min) 45 min    Activity Tolerance Patient tolerated treatment well    Behavior During Therapy [facility] for tasks assessed/performed                 Past Medical History:  Diagnosis Date   Edema leg    Erectile dysfunction    Family history of premature CAD 09/28/2016   GERD (gastroesophageal reflux disease)    Hyperlipidemia    Hypertension    Varicose vein of leg    Past Surgical History:  Procedure Laterality Date   COLONOSCOPY  04/29/2015   per Dr. Henrene Pastor, hyperplastic polyps, repeat in 10 yrs    ENDOVENOUS ABLATION SAPHENOUS VEIN W/ LASER Right    SHOULDER ARTHROSCOPY WITH ROTATOR CUFF REPAIR AND SUBACROMIAL DECOMPRESSION Right 06/13/2021   Procedure: RIGHT SHOULDER ARTHROSCOPY WITH ROTATOR CUFF REPAIR;  Surgeon: Vanetta Mulders, MD;  Location: [place];  Service: Orthopedics;  Laterality: Right;   TONSILLECTOMY     Patient Active Problem List   Diagnosis Date Noted   Rotator cuff injury, initial encounter    Subacromial bursitis of right shoulder joint    Nutritional counseling 08/20/2020   Superficial thrombophlebitis 01/15/2019   Family history of premature CAD 09/28/2016   Excessive daytime sleepiness 09/28/2016   Hyperglycemia 03/17/2014   Hyperlipidemia 10/08/2009   VARICOSE VEINS, LOWER EXTREMITIES 10/08/2009   Essential hypertension 11/12/2008   EDEMA LEG 11/12/2008   REFERRING PROVIDER: Vanetta Mulders, MD   REFERRING DIAG: S46.011D (ICD-10-CM) - Traumatic complete tear of right rotator cuff, subsequent encounter    THERAPY DIAG:  Shoulder joint  stiffness, right   Muscle weakness (generalized)   Acute pain of right shoulder   Localized edema     ONSET DATE: 06/13/2021- Surgery   SUBJECTIVE:                                                                                                                                                                                       SUBJECTIVE STATEMENT: Pt denies any pain within the shoulder. He feels is about 85-90% better. He still feels pain with sleeping on that arm sometimes.      Pt is 11  weeks and 1 day post op.     PAIN:  Are you having pain? No     PRECAUTIONS: Shoulder, RCR on 06/13/2021   WEIGHT BEARING RESTRICTIONS Yes R RTC repair        OBJECTIVE:      UPPER EXTREMITY AROM/PROM:   Symmetrical bilet except for [facility] IR reach on R- L3   UPPER EXTREMITY HHD in lbs:   L Flexion 26.5 ABD 38.5 IR 32.2 ER 24.8  R flexion 28.1,  ABD 23.9, IR 32.2, ER 20.8     TODAY'S TREATMENT    Manual Therapy: R GHJ inf grade IV R prone Ant GHJ mob grade IV  Therapeutic Exercise: OH 3lb ABCs 5lb KB arnold press 1x15, 1x10 Bent over row 10lbs 3x10 Tricep kickback 3x10 10lbs Face pulls 5lbs 2x10 Counter push up 15x Bicep curl8lbs 3x10       PATIENT EDUCATION: Education details: Manatomy, exercise progression, DOMS expectations, muscle firing,  envelope of function, HEP, POC  Person educated: Patient  Education method: Explanation, Demonstration, Verbal cues Education comprehension: verbalized understanding, returned demonstration, verbal cues required     HOME EXERCISE PROGRAM: Access Code: [email or web address] URL: [email or web address] Date: 09/08/2021 Prepared by: Daleen Bo  Exercises Shoulder Overhead Press in Flexion with Dumbbells - 1 x daily - 3-4 x weekly - 2 sets - 10 reps Standing Shoulder Internal Rotation Stretch with Towel - 2 x daily - 3-4 x weekly - 1 sets - 4 reps - 15 hold Face Pulls - 1 x daily - 3-4 x weekly - 3 sets - 10  reps Standing Bent Over Single Arm Scapular Row with Table Support - 1 x daily - 3-4 x weekly - 3 sets - 10 reps Push-Up on Counter - 1 x daily - 3-4 x weekly - 3 sets - 10 reps Standing Bicep Curls Supinated with Dumbbells - 1 x daily - 3-4 x weekly - 3 sets - 10 reps Standing Bent Over on Chair Single Arm Tricep Extension with Dumbbell - 1 x daily - 3-4 x weekly - 3 sets - 10 reps         ASSESSMENT:   CLINICAL IMPRESSION:   Pt progressing very well with rehab at this time. ROM is nearly full but pt is still 60-80% limited at this time with R RTC strength as compared to L. Pt did respond well to R GHJ ant mobilization with improved IR reach to T10 following. Pt HEP updated at this time as pt is member of [facility] facility. Pt to decrease frequency for more independent management and self strengthening. Pt gave verbal understanding to RPE based training and self exercise progression and safety in the gym setting. Pt should benefit from continued skilled therapy in order to address ongoing goals and maximize functional R UE strength and ROM to assist in returning to PLOF.    Objective impairments include decreased ROM, decreased strength, hypomobility, increased edema, increased fascial restrictions, increased muscle spasms, impaired flexibility, impaired UE functional use, improper body mechanics, postural dysfunction, and pain. These impairments are limiting patient from cleaning, community activity, driving, meal prep, occupation, laundry, yard work, and shopping.       GOALS:     SHORT TERM GOALS:   STG Name Target Date Goal status  1 Pt will become independent with HEP in order to demonstrate synthesis of PT education.   06/30/2021 MET  2 Pt will demonstrate at least a 15 pt improvement in SPADI in order to demonstrate a clinically significant  change in shoulder pain and function     07/28/2021 MET  3 Pt will be able to demonstrate R UE PROM to 140 flexion, 40 ER, and 80  ABD  in order to demonstrate functional improvement in UE ROM for transition to muscle activation/strength phase of protocol.    07/28/2021 MET  4 Pt will be able to demonstrate full PROM in all planes in order to demonstrate functional improvement in UE ROM for progression to next phase.    07/28/2021 ongoing    LONG TERM GOALS:    LTG Name Target Date Goal status  1 Pt  will become independent with final HEP in order to demonstrate synthesis of PT education.   09/08/2021 ongoing  2 Pt will be able to demonstrate to perform full AROM in all planes in order to demonstrate functional improvement in UE/LE function for self-care and house hold duties.     09/08/2021 Partially met  3 Pt will be able to reach [facility] and carry/hold >3 lbs in order to demonstrate functional improvement in R UE strength for return to ADL and basic household duties.     09/08/2021 MET  4 Pt will be able to demonstrate ability to carry 5-10lbs in order to demonstrate functional improvement in UE function for self-care and house hold duties.     09/08/2021 ongoing    PLAN:   1-2x/week 12 weeks    PLANNED INTERVENTIONS: Therapeutic exercises, Therapeutic activity, Neuro Muscular re-education, Patient/Family education, Joint mobilization, Aquatic Therapy, Dry Needling, Electrical stimulation, Spinal mobilization, Cryotherapy, Moist heat, scar mobilization, Taping, Vasopneumatic device, Traction, Ultrasound, Ionotophoresis 38m/ml Dexamethasone, and Manual therapy   PLAN FOR NEXT SESSION: ant GHJ mob for IR, gym based strength, ABD strength  ADaleen BoPT, DPT 09/08/21 5:02 PM

## 2021-09-14 ENCOUNTER — Ambulatory Visit (HOSPITAL_BASED_OUTPATIENT_CLINIC_OR_DEPARTMENT_OTHER): Payer: 59 | Admitting: Physical Therapy

## 2021-09-14 ENCOUNTER — Other Ambulatory Visit: Payer: Self-pay

## 2021-09-14 ENCOUNTER — Encounter (HOSPITAL_BASED_OUTPATIENT_CLINIC_OR_DEPARTMENT_OTHER): Payer: Self-pay | Admitting: Physical Therapy

## 2021-09-14 DIAGNOSIS — M25611 Stiffness of right shoulder, not elsewhere classified: Secondary | ICD-10-CM

## 2021-09-14 DIAGNOSIS — R600 Localized edema: Secondary | ICD-10-CM

## 2021-09-14 DIAGNOSIS — M25511 Pain in right shoulder: Secondary | ICD-10-CM

## 2021-09-14 DIAGNOSIS — M6281 Muscle weakness (generalized): Secondary | ICD-10-CM

## 2021-09-14 NOTE — Therapy (Signed)
OUTPATIENT PHYSICAL THERAPY TREATMENT NOTE   Patient Name: Lee Ballard MRN: [id number] DOB:04/01/1966, 56 y.o., male Today's Date: 09/14/2021  PCP: Laurey Morale, MD  PT End of Session - 09/14/21 1558     Visit Number 21    Number of Visits 26    Date for PT Re-Evaluation 12/07/21    Authorization Type UHC    PT Start Time 6222    PT Stop Time 1556    PT Time Calculation (min) 41 min    Activity Tolerance Patient tolerated treatment well    Behavior During Therapy [facility] for tasks assessed/performed                  Past Medical History:  Diagnosis Date   Edema leg    Erectile dysfunction    Family history of premature CAD 09/28/2016   GERD (gastroesophageal reflux disease)    Hyperlipidemia    Hypertension    Varicose vein of leg    Past Surgical History:  Procedure Laterality Date   COLONOSCOPY  04/29/2015   per Dr. Henrene Pastor, hyperplastic polyps, repeat in 10 yrs    ENDOVENOUS ABLATION SAPHENOUS VEIN W/ LASER Right    SHOULDER ARTHROSCOPY WITH ROTATOR CUFF REPAIR AND SUBACROMIAL DECOMPRESSION Right 06/13/2021   Procedure: RIGHT SHOULDER ARTHROSCOPY WITH ROTATOR CUFF REPAIR;  Surgeon: Vanetta Mulders, MD;  Location: [place];  Service: Orthopedics;  Laterality: Right;   TONSILLECTOMY     Patient Active Problem List   Diagnosis Date Noted   Rotator cuff injury, initial encounter    Subacromial bursitis of right shoulder joint    Nutritional counseling 08/20/2020   Superficial thrombophlebitis 01/15/2019   Family history of premature CAD 09/28/2016   Excessive daytime sleepiness 09/28/2016   Hyperglycemia 03/17/2014   Hyperlipidemia 10/08/2009   VARICOSE VEINS, LOWER EXTREMITIES 10/08/2009   Essential hypertension 11/12/2008   EDEMA LEG 11/12/2008   REFERRING PROVIDER: Vanetta Mulders, MD   REFERRING DIAG: S46.011D (ICD-10-CM) - Traumatic complete tear of right rotator cuff, subsequent encounter    THERAPY DIAG:  Shoulder joint  stiffness, right   Muscle weakness (generalized)   Acute pain of right shoulder   Localized edema     ONSET DATE: 06/13/2021- Surgery   SUBJECTIVE:                                                                                                                                                                                       SUBJECTIVE STATEMENT: Pt denies any pain within the shoulder. He had some pain with the L shoulder after testing but no pain currently. "Everything is back to normal." No issues with new  HEP.     Pt is 11 weeks and 1 day post op.     PAIN:  Are you having pain? No     PRECAUTIONS: Shoulder, RCR on 06/13/2021   WEIGHT BEARING RESTRICTIONS Yes R RTC repair        OBJECTIVE:      UPPER EXTREMITY AROM/PROM:   Symmetrical bilet except for [facility] IR reach on R- L3   UPPER EXTREMITY HHD in lbs:   L Flexion 26.5 ABD 38.5 IR 32.2 ER 24.8  R flexion 28.1,  ABD 23.9, IR 32.2, ER 20.8     TODAY'S TREATMENT    Manual Therapy: R GHJ inf grade IV R prone Ant GHJ mob grade IV  Therapeutic Exercise: OH 3lb ABCs 10lb KB arnold press 1x15, 1x10 Bent over row 15lbs 3x10 Tricep extension on cable 3x10 25bs Face pulls 5lbs 2x10 Counter push up 15x Cable curl 25 lbs 3x10 Wall ball circles CW and CCW 20x each       PATIENT EDUCATION: Education details: anatomy, exercise progression, DOMS expectations, muscle firing,  envelope of function, HEP, POC  Person educated: Patient  Education method: Explanation, Demonstration, Verbal cues Education comprehension: verbalized understanding, returned demonstration, verbal cues required     HOME EXERCISE PROGRAM: Access Code: [email or web address] URL: [email or web address] Date: 09/08/2021 Prepared by: Daleen Bo  Exercises Shoulder Overhead Press in Flexion with Dumbbells - 1 x daily - 3-4 x weekly - 2 sets - 10 reps Standing Shoulder Internal Rotation Stretch with Towel - 2 x daily - 3-4 x weekly -  1 sets - 4 reps - 15 hold Face Pulls - 1 x daily - 3-4 x weekly - 3 sets - 10 reps Standing Bent Over Single Arm Scapular Row with Table Support - 1 x daily - 3-4 x weekly - 3 sets - 10 reps Push-Up on Counter - 1 x daily - 3-4 x weekly - 3 sets - 10 reps Standing Bicep Curls Supinated with Dumbbells - 1 x daily - 3-4 x weekly - 3 sets - 10 reps Standing Bent Over on Chair Single Arm Tricep Extension with Dumbbell - 1 x daily - 3-4 x weekly - 3 sets - 10 reps         ASSESSMENT:   CLINICAL IMPRESSION: Pt able to progress R UE strengthening at today's session. Pt's report of pain after last session in the L UE appears consistent with DOMS. Pt able to progress towards more gym based strengthening without pain. Pt does require VC and TC for form and technique but is in strength phase of rehab. Pt given clearance to begin gentle to moderate strengthening with trainers here at [employer]. Pt to reduce frequency of 1x/week every 2 weeks.   Pt should benefit from continued skilled therapy in order to address ongoing goals and maximize functional R UE strength and ROM to assist in returning to PLOF.    Objective impairments include decreased ROM, decreased strength, hypomobility, increased edema, increased fascial restrictions, increased muscle spasms, impaired flexibility, impaired UE functional use, improper body mechanics, postural dysfunction, and pain. These impairments are limiting patient from cleaning, community activity, driving, meal prep, occupation, laundry, yard work, and shopping.       GOALS:     SHORT TERM GOALS:   STG Name Target Date Goal status  1 Pt will become independent with HEP in order to demonstrate synthesis of PT education.   06/30/2021 MET  2 Pt will demonstrate at least a 15 pt improvement in  SPADI in order to demonstrate a clinically significant change in shoulder pain and function     07/28/2021 MET  3 Pt will be able to demonstrate R UE PROM to 140 flexion,  40 ER, and 80  ABD in order to demonstrate functional improvement in UE ROM for transition to muscle activation/strength phase of protocol.    07/28/2021 MET  4 Pt will be able to demonstrate full PROM in all planes in order to demonstrate functional improvement in UE ROM for progression to next phase.    07/28/2021 ongoing    LONG TERM GOALS:    LTG Name Target Date Goal status  1 Pt  will become independent with final HEP in order to demonstrate synthesis of PT education.   09/08/2021 ongoing  2 Pt will be able to demonstrate to perform full AROM in all planes in order to demonstrate functional improvement in UE/LE function for self-care and house hold duties.     09/08/2021 Partially met  3 Pt will be able to reach [facility] and carry/hold >3 lbs in order to demonstrate functional improvement in R UE strength for return to ADL and basic household duties.     09/08/2021 MET  4 Pt will be able to demonstrate ability to carry 5-10lbs in order to demonstrate functional improvement in UE function for self-care and house hold duties.     09/08/2021 ongoing    PLAN:   1-2x/week 12 weeks    PLANNED INTERVENTIONS: Therapeutic exercises, Therapeutic activity, Neuro Muscular re-education, Patient/Family education, Joint mobilization, Aquatic Therapy, Dry Needling, Electrical stimulation, Spinal mobilization, Cryotherapy, Moist heat, scar mobilization, Taping, Vasopneumatic device, Traction, Ultrasound, Ionotophoresis 14m/ml Dexamethasone, and Manual therapy   PLAN FOR NEXT SESSION: ant GHJ mob for IR, gym based strength, OH strength, advanced scap stability, return to automotive/yard work  ADaleen BoPT, DPT 09/14/21 3:59 PM

## 2021-09-15 ENCOUNTER — Ambulatory Visit: Payer: Self-pay | Admitting: Cardiology

## 2021-09-19 ENCOUNTER — Ambulatory Visit (HOSPITAL_BASED_OUTPATIENT_CLINIC_OR_DEPARTMENT_OTHER): Payer: 59 | Admitting: Physical Therapy

## 2021-09-26 ENCOUNTER — Ambulatory Visit (HOSPITAL_BASED_OUTPATIENT_CLINIC_OR_DEPARTMENT_OTHER): Payer: 59 | Admitting: Physical Therapy

## 2021-09-26 ENCOUNTER — Other Ambulatory Visit: Payer: Self-pay

## 2021-09-26 ENCOUNTER — Encounter (HOSPITAL_BASED_OUTPATIENT_CLINIC_OR_DEPARTMENT_OTHER): Payer: Self-pay | Admitting: Physical Therapy

## 2021-09-26 DIAGNOSIS — M25511 Pain in right shoulder: Secondary | ICD-10-CM

## 2021-09-26 DIAGNOSIS — M6281 Muscle weakness (generalized): Secondary | ICD-10-CM

## 2021-09-26 DIAGNOSIS — M25611 Stiffness of right shoulder, not elsewhere classified: Secondary | ICD-10-CM | POA: Diagnosis not present

## 2021-09-26 NOTE — Therapy (Signed)
OUTPATIENT PHYSICAL THERAPY TREATMENT NOTE   Patient Name: Lee Ballard MRN: [id number] DOB:1965-10-03, 56 y.o., male Today's Date: 09/26/2021  PCP: Laurey Morale, MD  PT End of Session - 09/26/21 1557     Visit Number 22    Number of Visits 26    Date for PT Re-Evaluation 12/07/21    Authorization Type U[employer]    PT Start Time 1600    PT Stop Time 9509    PT Time Calculation (min) 38 min    Activity Tolerance Patient tolerated treatment well    Behavior During Therapy [facility] for tasks assessed/performed                   Past Medical History:  Diagnosis Date   Edema leg    Erectile dysfunction    Family history of premature CAD 09/28/2016   GERD (gastroesophageal reflux disease)    Hyperlipidemia    Hypertension    Varicose vein of leg    Past Surgical History:  Procedure Laterality Date   COLONOSCOPY  04/29/2015   per Dr. Henrene Pastor, hyperplastic polyps, repeat in 10 yrs    ENDOVENOUS ABLATION SAPHENOUS VEIN W/ LASER Right    SHOULDER ARTHROSCOPY WITH ROTATOR CUFF REPAIR AND SUBACROMIAL DECOMPRESSION Right 06/13/2021   Procedure: RIGHT SHOULDER ARTHROSCOPY WITH ROTATOR CUFF REPAIR;  Surgeon: Vanetta Mulders, MD;  Location: [place];  Service: Orthopedics;  Laterality: Right;   TONSILLECTOMY     Patient Active Problem List   Diagnosis Date Noted   Rotator cuff injury, initial encounter    Subacromial bursitis of right shoulder joint    Nutritional counseling 08/20/2020   Superficial thrombophlebitis 01/15/2019   Family history of premature CAD 09/28/2016   Excessive daytime sleepiness 09/28/2016   Hyperglycemia 03/17/2014   Hyperlipidemia 10/08/2009   VARICOSE VEINS, LOWER EXTREMITIES 10/08/2009   Essential hypertension 11/12/2008   EDEMA LEG 11/12/2008   REFERRING PROVIDER: Vanetta Mulders, MD   REFERRING DIAG: S46.011D (ICD-10-CM) - Traumatic complete tear of right rotator cuff, subsequent encounter    THERAPY DIAG:  Shoulder joint  stiffness, right   Muscle weakness (generalized)   Acute pain of right shoulder   Localized edema     ONSET DATE: 06/13/2021- Surgery   SUBJECTIVE:                                                                                                                                                                                       SUBJECTIVE STATEMENT: Pt states he is continuing to do well. He has had no issues. He was sore for 1-2 days after last session.    Pt is 15 weeks  post op.   PAIN:  Are you having pain? No     PRECAUTIONS: Shoulder, RCR on 06/13/2021   WEIGHT BEARING RESTRICTIONS Yes R RTC repair      OBJECTIVE:       UPPER EXTREMITY HHD in lbs: (Previously taken)   L Flexion 26.5 ABD 38.5 IR 32.2 ER 24.8  R flexion 28.1,  ABD 23.9, IR 32.2, ER 20.8     TODAY'S TREATMENT   Therapeutic Exercise:  UBE L3 3 min fwd and retro  Shoulder press machine 1x10 25lbs 1x10 15lbs due to form/fatigue IR/ER cable 2x10 each 5lbs  Lateral raise 2x10 3lbs  Standing rope row  in ABD 3x10 35lbs Tricep rope extension on cable 3x10 25bs Chest press machine 2x10 25lbs Cable row 40 bs 3x10 Wall ball circles CW and CCW 20x each Red swissball       PATIENT EDUCATION: Education details: [place] progression of exercise, DOMS expectations, muscle firing,  envelope of function, HEP, POC  Person educated: Patient  Education method: Explanation, Demonstration, Verbal cues Education comprehension: verbalized understanding, returned demonstration, verbal cues required     HOME EXERCISE PROGRAM: Access Code: [email or web address] URL: [email or web address] Date: 09/26/2021 Prepared by: Daleen Bo  Exercises Shoulder Overhead Press in Flexion with Dumbbells - 1 x daily - 3-4 x weekly - 2 sets - 10 reps Standing Shoulder Abduction with Dumbbells - 1 x daily - 3-4 x weekly - 3 sets - 10 reps Standing Bent Over Single Arm Scapular Row with Table Support - 1 x daily - 3-4 x  weekly - 3 sets - 10 reps Push-Up on Counter - 1 x daily - 3-4 x weekly - 3 sets - 10 reps Shoulder Internal Rotation with Resistance - 1 x daily - 3-4 x weekly - 3 sets - 10 reps Shoulder External Rotation with Anchored Resistance - 1 x daily - 3-4 x weekly - 3 sets - 10 reps      ASSESSMENT:   CLINICAL IMPRESSION: Pt able to continue with gym based strengthening program at this time. Pt return demo of how to use gym based equipment for isolated RTC strengthening and scapular stability exercise. OH strengthening not progressed at this time given accelerated progress. Pt advised to begin moderate strengthening with trainers here at [employer]. Personal training referral provided and sent to fitness coordinator. Pt to schedule in 3 weeks to assess progress with personal training. Likely D/C in 1-2 more visits.   Pt should benefit from continued skilled therapy in order to address ongoing goals and maximize functional R UE strength and ROM to assist in returning to PLOF.    Objective impairments include decreased ROM, decreased strength, hypomobility, increased edema, increased fascial restrictions, increased muscle spasms, impaired flexibility, impaired UE functional use, improper body mechanics, postural dysfunction, and pain. These impairments are limiting patient from cleaning, community activity, driving, meal prep, occupation, laundry, yard work, and shopping.       GOALS:     SHORT TERM GOALS:   STG Name Target Date Goal status  1 Pt will become independent with HEP in order to demonstrate synthesis of PT education.   06/30/2021 MET  2 Pt will demonstrate at least a 15 pt improvement in SPADI in order to demonstrate a clinically significant change in shoulder pain and function     07/28/2021 MET  3 Pt will be able to demonstrate R UE PROM to 140 flexion, 40 ER, and 80  ABD in order to demonstrate functional improvement in UE ROM  for transition to muscle activation/strength phase of  protocol.    07/28/2021 MET  4 Pt will be able to demonstrate full PROM in all planes in order to demonstrate functional improvement in UE ROM for progression to next phase.    07/28/2021 MET    LONG TERM GOALS:    LTG Name Target Date Goal status  1 Pt  will become independent with final HEP in order to demonstrate synthesis of PT education.   09/08/2021 ongoing  2 Pt will be able to demonstrate to perform full AROM in all planes in order to demonstrate functional improvement in UE/LE function for self-care and house hold duties.     09/08/2021 Partially met  3 Pt will be able to reach [facility] and carry/hold >3 lbs in order to demonstrate functional improvement in R UE strength for return to ADL and basic household duties.     09/08/2021 MET  4 Pt will be able to demonstrate ability to carry 5-10lbs in order to demonstrate functional improvement in UE function for self-care and house hold duties.     09/08/2021 Partially met    PLAN:   1-2x/week 12 weeks    PLANNED INTERVENTIONS: Therapeutic exercises, Therapeutic activity, Neuro Muscular re-education, Patient/Family education, Joint mobilization, Aquatic Therapy, Dry Needling, Electrical stimulation, Spinal mobilization, Cryotherapy, Moist heat, scar mobilization, Taping, Vasopneumatic device, Traction, Ultrasound, Ionotophoresis 55m/ml Dexamethasone, and Manual therapy   PLAN FOR NEXT SESSION: gym based strength, OH strength, advanced scap stability, return to automotive/yard work  ADaleen BoPT, DPT 09/26/21 4:41 PM

## 2021-09-29 IMAGING — MR MR SHOULDER*R* W/O CM
5 of 6 series · 32 of 40 positions shown · non-contrast
Comparison: None.
COMPARISON: None.

Addendum:
CLINICAL DATA: Right shoulder pain.  Hurting for 2 months.

EXAM:
MRI OF THE RIGHT SHOULDER WITHOUT CONTRAST
TECHNIQUE: Multiplanar, multisequence MR imaging of the shoulder was performed.
No intravenous contrast was administered.

[Series 4: T2 fat-sat · axial · 4.0mm · 0.55mm/px · z∈[-58,+48]mm · 7 of 25 slices shown (1 of 3)]
[im 1/25]
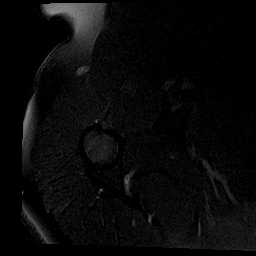
[im 5/25]
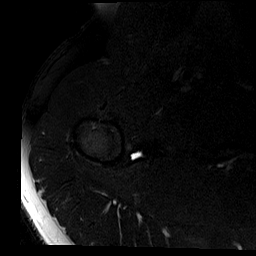
[im 9/25]
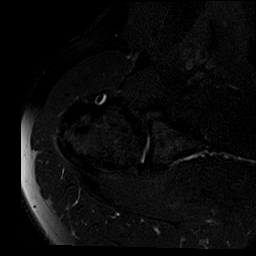
[im 13/25]
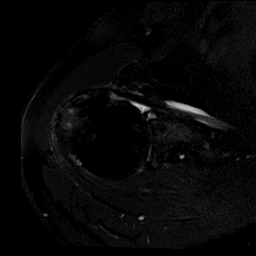
[im 17/25]
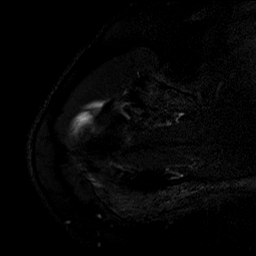
[im 21/25]
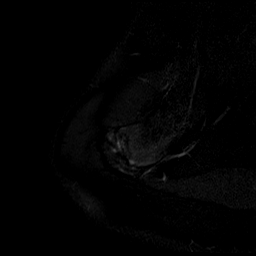
[im 25/25]
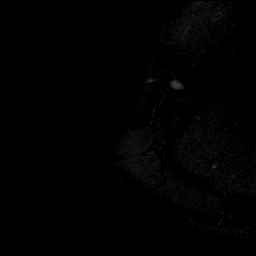

[Series 5: T2 fat-sat · oblique · 4.0mm · 0.59mm/px · 6 of 21 slices shown (2 of 3)]
[im 1/21]
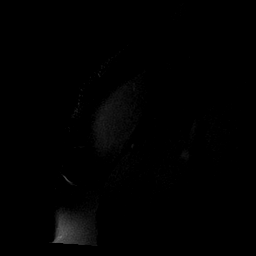
[im 5/21]
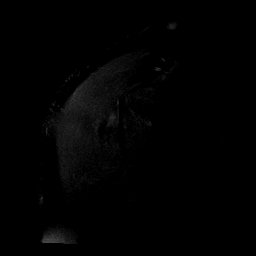
[im 9/21]
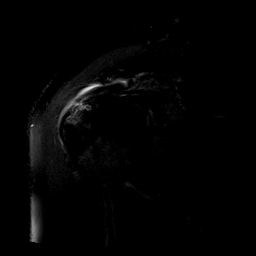
[im 13/21]
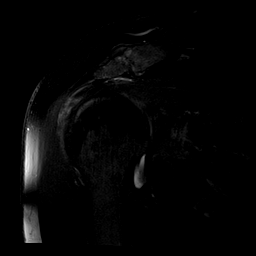
[im 17/21]
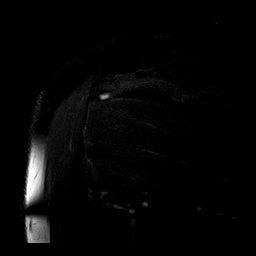
[im 21/21]
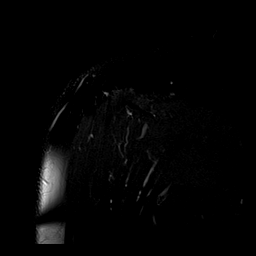

[Series 6: PD · oblique · 4.0mm · 0.29mm/px · 6 of 21 slices shown]
[im 1/21]
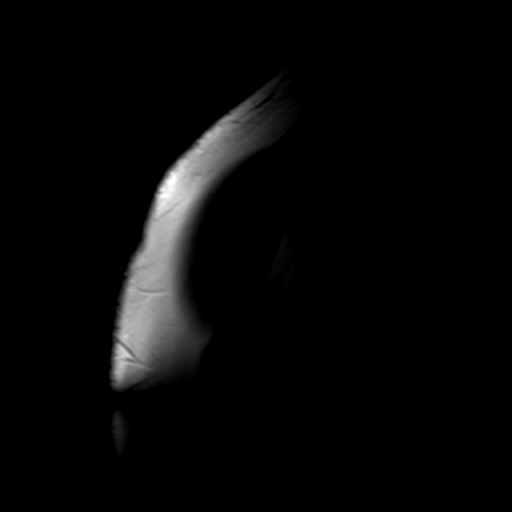
[im 5/21]
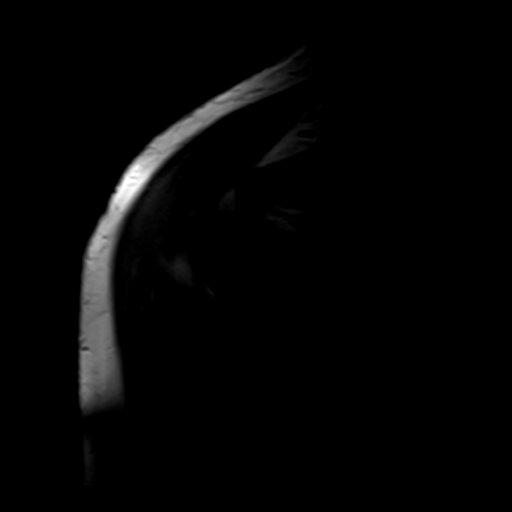
[im 9/21]
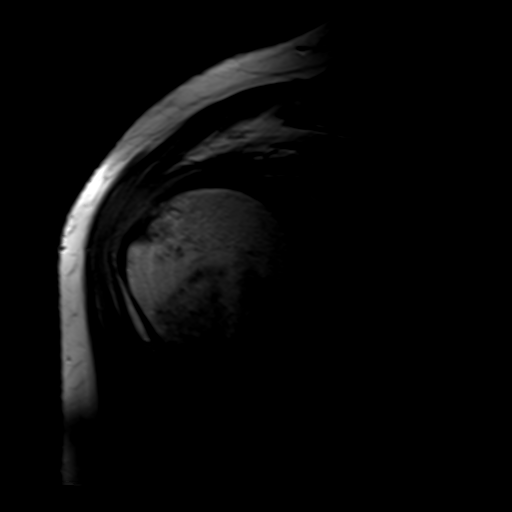
[im 13/21]
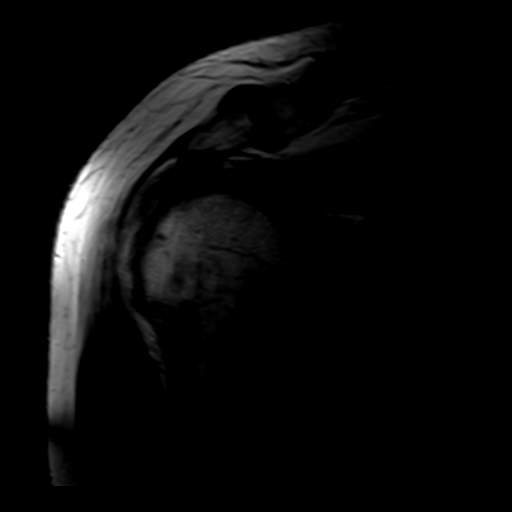
[im 17/21]
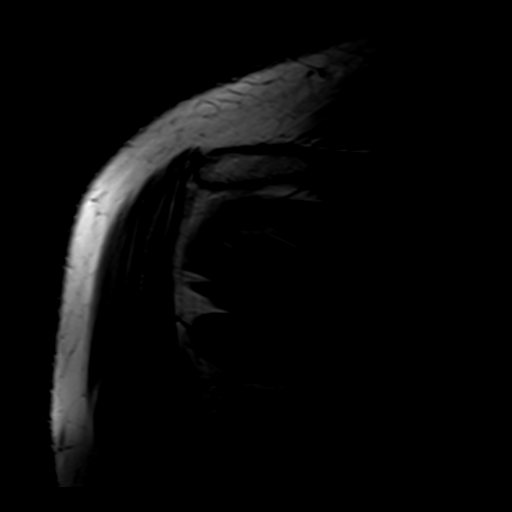
[im 21/21]
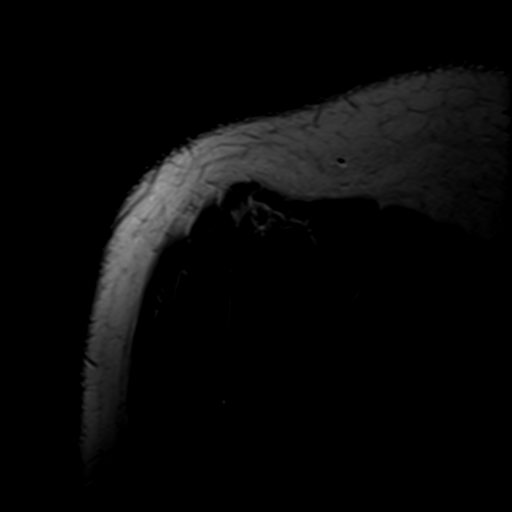

[Series 7: T2 fat-sat · oblique · 4.0mm · 0.59mm/px · 7 of 25 slices shown (3 of 3)]
[im 1/25]
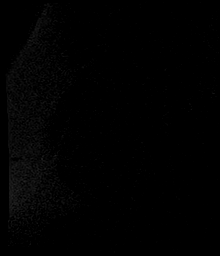
[im 5/25]
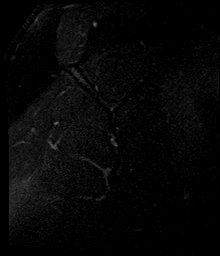
[im 9/25]
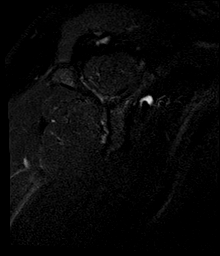
[im 13/25]
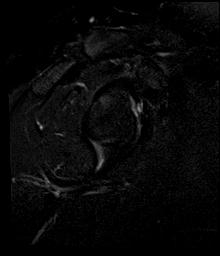
[im 17/25]
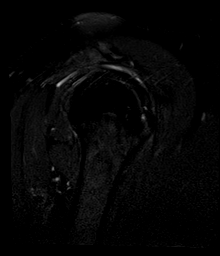
[im 21/25]
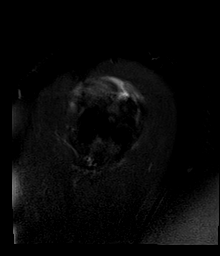
[im 25/25]
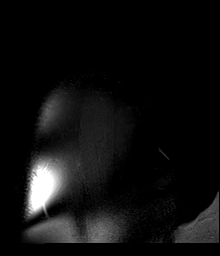

[Series 8: T1 · oblique · 4.0mm · 0.29mm/px · 6 of 25 slices shown]
[im 1/25]
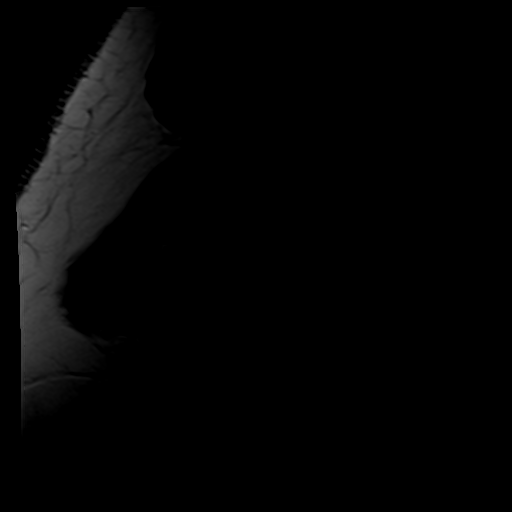
[im 5/25]
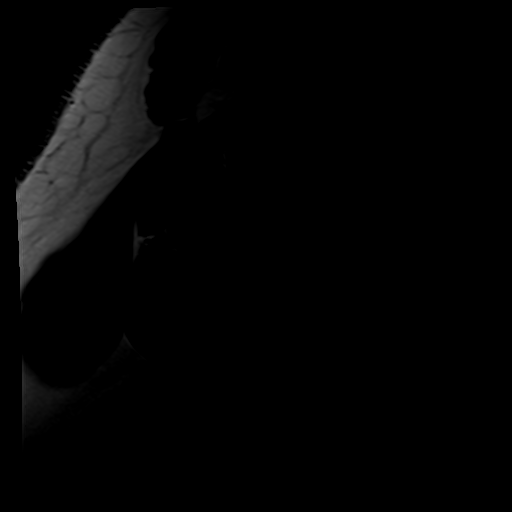
[im 9/25]
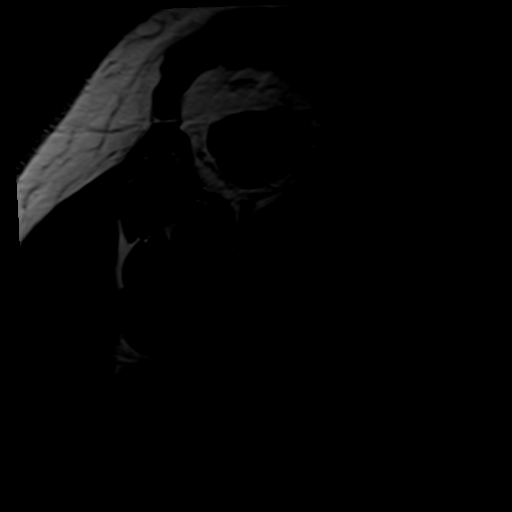
[im 13/25]
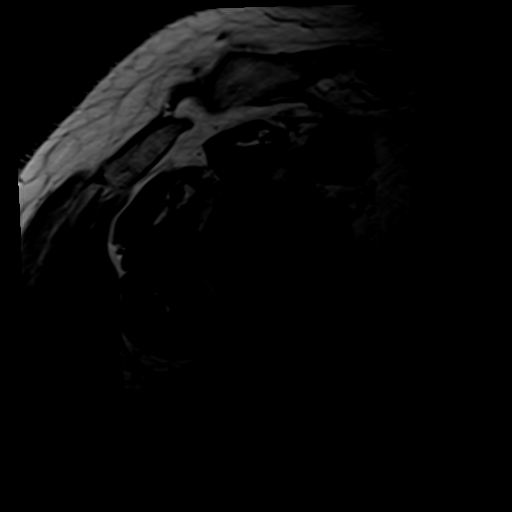
[im 17/25]
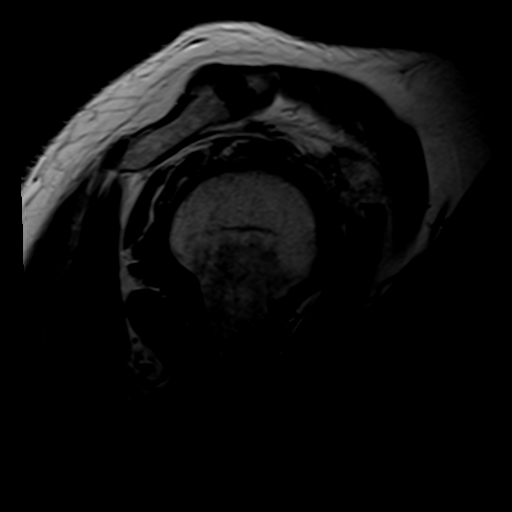
[im 21/25]
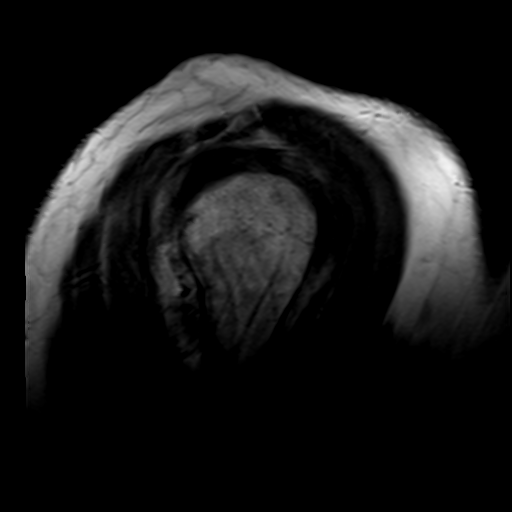

[32 of 40 positions shown; findings below may reference images not displayed]

FINDINGS: Patient motion degrades image quality limiting evaluation.

Rotator cuff: Severe tendinosis of the supraspinatus tendon with a
small full-thickness tear of the anterior supraspinatus tendon.
Severe tendinosis of the infraspinatus tendon. Teres minor tendon is
intact. Subscapularis tendon is intact.

Muscles: No muscle atrophy or edema. No intramuscular fluid
collection or hematoma.

Biceps Long Head: Intraarticular and extraarticular portions of the
biceps tendon are intact.

Acromioclavicular Joint: Moderate arthropathy of the
acromioclavicular joint. Trace subacromial/subdeltoid bursal fluid.

Glenohumeral Joint: No joint effusion. No chondral defect.

Labrum: Grossly intact, but evaluation is limited by lack of
intraarticular fluid/contrast.

Bones: No fracture or dislocation. No aggressive osseous lesion.

Other: No fluid collection or hematoma.
IMPRESSION: 1. Oblique tear of the posterior horn of the medial meniscus
extending to the inferior articular surface and into the body.
2. Radial tear of the body of the lateral meniscus.

ADDENDUM:
Voice recognition error in the original dictation in the impression
section. Corrected impression should read as follows.
IMPRESSION: 1. Severe tendinosis of the supraspinatus tendon with a small
full-thickness tear of the anterior supraspinatus tendon.

2. Severe tendinosis of the infraspinatus tendon.

*** End of Addendum ***
FINDINGS: Patient motion degrades image quality limiting evaluation.

Rotator cuff: Severe tendinosis of the supraspinatus tendon with a
small full-thickness tear of the anterior supraspinatus tendon.
Severe tendinosis of the infraspinatus tendon. Teres minor tendon is
intact. Subscapularis tendon is intact.

Muscles: No muscle atrophy or edema. No intramuscular fluid
collection or hematoma.

Biceps Long Head: Intraarticular and extraarticular portions of the
biceps tendon are intact.

Acromioclavicular Joint: Moderate arthropathy of the
acromioclavicular joint. Trace subacromial/subdeltoid bursal fluid.

Glenohumeral Joint: No joint effusion. No chondral defect.

Labrum: Grossly intact, but evaluation is limited by lack of
intraarticular fluid/contrast.

Bones: No fracture or dislocation. No aggressive osseous lesion.

Other: No fluid collection or hematoma.
IMPRESSION: 1. Oblique tear of the posterior horn of the medial meniscus
extending to the inferior articular surface and into the body.
2. Radial tear of the body of the lateral meniscus.

## 2021-10-03 ENCOUNTER — Ambulatory Visit (HOSPITAL_BASED_OUTPATIENT_CLINIC_OR_DEPARTMENT_OTHER): Payer: 59 | Admitting: Physical Therapy

## 2021-10-10 ENCOUNTER — Ambulatory Visit (HOSPITAL_BASED_OUTPATIENT_CLINIC_OR_DEPARTMENT_OTHER): Payer: Self-pay | Attending: Orthopaedic Surgery | Admitting: Physical Therapy

## 2021-10-10 ENCOUNTER — Encounter (HOSPITAL_BASED_OUTPATIENT_CLINIC_OR_DEPARTMENT_OTHER): Payer: Self-pay | Admitting: Physical Therapy

## 2021-10-10 ENCOUNTER — Other Ambulatory Visit: Payer: Self-pay

## 2021-10-10 DIAGNOSIS — M25611 Stiffness of right shoulder, not elsewhere classified: Secondary | ICD-10-CM | POA: Insufficient documentation

## 2021-10-10 DIAGNOSIS — M6281 Muscle weakness (generalized): Secondary | ICD-10-CM | POA: Insufficient documentation

## 2021-10-10 DIAGNOSIS — M25511 Pain in right shoulder: Secondary | ICD-10-CM | POA: Insufficient documentation

## 2021-10-10 NOTE — Therapy (Signed)
OUTPATIENT PHYSICAL THERAPY DISCHARGE NOTE   Patient Name: Lee Ballard MRN: [id number] DOB:1966-07-12, 56 y.o., male Today's Date: 10/10/2021  PCP: Laurey Morale, MD  PT End of Session - 10/10/21 1628     Visit Number 23    Number of Visits 26    Date for PT Re-Evaluation 12/07/21    Authorization Type [employer]    PT Start Time 1600    PT Stop Time 3212    PT Time Calculation (min) 20 min    Activity Tolerance Patient tolerated treatment well    Behavior During Therapy [facility] for tasks assessed/performed                    Past Medical History:  Diagnosis Date   Edema leg    Erectile dysfunction    Family history of premature CAD 09/28/2016   GERD (gastroesophageal reflux disease)    Hyperlipidemia    Hypertension    Varicose vein of leg    Past Surgical History:  Procedure Laterality Date   COLONOSCOPY  04/29/2015   per Dr. Henrene Pastor, hyperplastic polyps, repeat in 10 yrs    ENDOVENOUS ABLATION SAPHENOUS VEIN W/ LASER Right    SHOULDER ARTHROSCOPY WITH ROTATOR CUFF REPAIR AND SUBACROMIAL DECOMPRESSION Right 06/13/2021   Procedure: RIGHT SHOULDER ARTHROSCOPY WITH ROTATOR CUFF REPAIR;  Surgeon: Vanetta Mulders, MD;  Location: [place];  Service: Orthopedics;  Laterality: Right;   TONSILLECTOMY     Patient Active Problem List   Diagnosis Date Noted   Rotator cuff injury, initial encounter    Subacromial bursitis of right shoulder joint    Nutritional counseling 08/20/2020   Superficial thrombophlebitis 01/15/2019   Family history of premature CAD 09/28/2016   Excessive daytime sleepiness 09/28/2016   Hyperglycemia 03/17/2014   Hyperlipidemia 10/08/2009   VARICOSE VEINS, LOWER EXTREMITIES 10/08/2009   Essential hypertension 11/12/2008   EDEMA LEG 11/12/2008   REFERRING PROVIDER: Vanetta Mulders, MD   REFERRING DIAG: S46.011D (ICD-10-CM) - Traumatic complete tear of right rotator cuff, subsequent encounter    THERAPY DIAG:  Shoulder joint  stiffness, right   Muscle weakness (generalized)   Acute pain of right shoulder   Localized edema     ONSET DATE: 06/13/2021- Surgery   SUBJECTIVE:                                                                                                                                                                                       SUBJECTIVE STATEMENT: Pt states he is continuing to do well. He has had no issues. He was sore for 1-2 days after last session.    Pt is  weeks post op. °  °PAIN:  °Are you having pain? No °  °  °PRECAUTIONS: Shoulder, RCR on 06/13/2021 °  °WEIGHT BEARING RESTRICTIONS Yes R RTC repair  °  °  °OBJECTIVE:    ° °Total SPADI Score: 3 / 130 = 2.3 % ° °Full AROM bilaterally ° °  °UPPER EXTREMITY HHD in lbs:  Previous °L Flexion 26.5 ABD 38.5 IR 32.2 ER 24.8 ° °R flexion 28.1,  ABD 23.9, IR 32.2, ER 20.8 ° °2/6 °L Flexion 25.5 ABD 22 IR 26.5 ER 17.4 ° °R flexion 28.5,  ABD 23.1, IR 29.4, ER 19.8 ° ° °TODAY'S TREATMENT ° °Clearance for return to gym based exercise and beginning of personal training  ° ° °Review of HEP ° °Exercises °Shoulder Overhead Press in Flexion with Dumbbells - 1 x daily - 3-4 x weekly - 2 sets - 10 reps °Standing Shoulder Abduction with Dumbbells - 1 x daily - 3-4 x weekly - 3 sets - 10 reps °Standing Bent Over Single Arm Scapular Row with Table Support - 1 x daily - 3-4 x weekly - 3 sets - 10 reps °Push-Up on Counter - 1 x daily - 3-4 x weekly - 3 sets - 10 reps °Shoulder Internal Rotation with Resistance - 1 x daily - 3-4 x weekly - 3 sets - 10 reps °Shoulder External Rotation with Anchored Resistance - 1 x daily - 3-4 x weekly - 3 sets - 10 reps °Standing Shoulder Extension with Dowel - 1 x daily - 7 x weekly - 1 sets - 10 reps - 10 hold ° °   ° ° ° °PATIENT EDUCATION: °Education details: exam findings, [facility] progression of exercise, safety with OH exercise, safety/precautions with throwing, HEP, POC ° °Person educated: Patient  °Education method:  Explanation, Demonstration, Verbal cues °Education comprehension: verbalized understanding, returned demonstration, verbal cues required °  °  °HOME EXERCISE PROGRAM: °Access Code: [email or web address] °URL: [email or web address] °Date: 09/26/2021 °Prepared by: Alan Zhou ° ° ° °  °ASSESSMENT: °  °CLINICAL IMPRESSION: °Pt has met all PT goals at this time. Pt demonstrates full AROM at this time as well as at least 95% of L UE strength as measured by HHD. Pt has been very successful with PT and with transition over to personal training for regular gym based exercise and continuation of R shoulder strength. D/C this episode of care.  ° ° °Objective impairments include decreased ROM, decreased strength, hypomobility, increased edema, increased fascial restrictions, increased muscle spasms, impaired flexibility, impaired UE functional use, improper body mechanics, postural dysfunction, and pain. These impairments are limiting patient from cleaning, community activity, driving, meal prep, occupation, laundry, yard work, and shopping.  ° °  °  °GOALS: °  °  °SHORT TERM GOALS: °  °STG Name Target Date Goal status  °1 Pt will become independent with HEP in order to demonstrate synthesis of PT education. °  06/30/2021 MET  °2 Pt will demonstrate at least a 15 pt improvement in SPADI in order to demonstrate a clinically significant change in shoulder pain and function °  °  07/28/2021 MET  °3 Pt will be able to demonstrate R UE PROM to 140 flexion, 40 ER, and 80  ABD in order to demonstrate functional improvement in UE ROM for transition to muscle activation/strength phase of protocol.  °  07/28/2021 MET  °4 Pt will be able to demonstrate full PROM in all planes in order to demonstrate functional improvement in UE ROM for progression to next phase.  °    07/28/2021 MET  °  °LONG TERM GOALS:  °  °LTG Name Target Date Goal status  °1 Pt  will become independent with final HEP in order to demonstrate synthesis of PT education. °   09/08/2021 MET  °2 Pt will be able to demonstrate to perform full AROM in all planes in order to demonstrate functional improvement in UE/LE function for self-care and house hold duties. °  °  09/08/2021 met  °3 Pt will be able to reach OH and carry/hold >3 lbs in order to demonstrate functional improvement in R UE strength for return to ADL and basic household duties. °  °  09/08/2021 MET  °4 Pt will be able to demonstrate ability to carry 5-10lbs in order to demonstrate functional improvement in UE function for self-care and house hold duties. °  °  09/08/2021 MET  °  °PLAN:  ° °1-2x/week °12 weeks ° °  °PLANNED INTERVENTIONS: Therapeutic exercises, Therapeutic activity, Neuro Muscular re-education, Patient/Family education, Joint mobilization, Aquatic Therapy, Dry Needling, Electrical stimulation, Spinal mobilization, Cryotherapy, Moist heat, scar mobilization, Taping, Vasopneumatic device, Traction, Ultrasound, Ionotophoresis 4mg/ml Dexamethasone, and Manual therapy °  ° ° °Alan Zhou PT, DPT °10/10/21 4:29 PM ° ° ° °PHYSICAL THERAPY DISCHARGE SUMMARY ° °Visits from Start of Care: 23 ° °Plan: °Patient agrees to discharge.  Patient goals were met. Patient is being discharged due to meeting the stated rehab goals.    ° ° ° ° ° ° °  ° °

## 2021-11-04 ENCOUNTER — Ambulatory Visit (HOSPITAL_BASED_OUTPATIENT_CLINIC_OR_DEPARTMENT_OTHER): Payer: 59 | Admitting: Orthopaedic Surgery

## 2021-11-16 ENCOUNTER — Other Ambulatory Visit: Payer: Self-pay | Admitting: Family Medicine

## 2021-11-18 ENCOUNTER — Other Ambulatory Visit (HOSPITAL_BASED_OUTPATIENT_CLINIC_OR_DEPARTMENT_OTHER): Payer: Self-pay

## 2022-01-04 ENCOUNTER — Other Ambulatory Visit (HOSPITAL_BASED_OUTPATIENT_CLINIC_OR_DEPARTMENT_OTHER): Payer: Self-pay

## 2022-01-04 ENCOUNTER — Ambulatory Visit: Payer: Managed Care, Other (non HMO) | Admitting: Cardiology

## 2022-01-04 ENCOUNTER — Encounter: Payer: Self-pay | Admitting: Cardiology

## 2022-01-04 VITALS — BP 134/82 | HR 86 | Ht 73.0 in | Wt 287.0 lb

## 2022-01-04 DIAGNOSIS — I10 Essential (primary) hypertension: Secondary | ICD-10-CM | POA: Diagnosis not present

## 2022-01-04 DIAGNOSIS — E7800 Pure hypercholesterolemia, unspecified: Secondary | ICD-10-CM

## 2022-01-04 DIAGNOSIS — R931 Abnormal findings on diagnostic imaging of heart and coronary circulation: Secondary | ICD-10-CM | POA: Diagnosis not present

## 2022-01-04 MED ORDER — ROSUVASTATIN CALCIUM 40 MG PO TABS
40.0000 mg | ORAL_TABLET | Freq: Every day | ORAL | 3 refills | Status: DC
  Filled 2022-01-04: qty 30, 30d supply, fill #0
  Filled 2022-02-12: qty 30, 30d supply, fill #1
  Filled 2022-03-13: qty 30, 30d supply, fill #2
  Filled 2022-04-09: qty 30, 30d supply, fill #3
  Filled 2022-05-09: qty 30, 30d supply, fill #4

## 2022-01-04 NOTE — Addendum Note (Signed)
Addended by: Antonieta Iba on: 01/04/2022 08:52 AM ? ? Modules accepted: Orders ? ?

## 2022-01-04 NOTE — Progress Notes (Signed)
? ?Cardiology CONSULT Note   ? ?Date:  01/04/2022  ? ?ID:  Lee Ballard, DOB [birth year], MRN [id number] ? ?PCP:  Laurey Morale, MD  ?Cardiologist:  Fransico Him, MD  ? ?Chief Complaint  ?Patient presents with  ? Follow-up  ?  Coronary artery calcifications  ? ? ?History of Present Illness:  ?Lee Ballard is a 56 y.o. male who is being seen today for the evaluation to reestablish cardiac care for fm hx of premature CAD at the request of Laurey Morale, MD. ? ?Lee Ballard is a 56 y.o. male with a history of HTN, Hyperlipidemia and LE edema who has a strong family history of CAD with his father dying of an MI at 80 and several paternal uncles with heart disease at a young age. He is here today for followup and is doing well.  He denies any chest pain or pressure, SOB, DOE (except with extreme exertion), PND, orthopnea, LE edema, palpitations or syncope. His only complaint is that when he works out hard he may get lightheaded but no syncope.  He is compliant with his meds and is tolerating meds with no SE.    ? ?Past Medical History:  ?Diagnosis Date  ? Edema leg   ? Erectile dysfunction   ? Family history of premature CAD 09/28/2016  ? GERD (gastroesophageal reflux disease)   ? Hyperlipidemia   ? Hypertension   ? Varicose vein of leg   ? ? ?Past Surgical History:  ?Procedure Laterality Date  ? COLONOSCOPY  04/29/2015  ? per Dr. Henrene Pastor, hyperplastic polyps, repeat in 10 yrs   ? ENDOVENOUS ABLATION SAPHENOUS VEIN W/ LASER Right   ? SHOULDER ARTHROSCOPY WITH ROTATOR CUFF REPAIR AND SUBACROMIAL DECOMPRESSION Right 06/13/2021  ? Procedure: RIGHT SHOULDER ARTHROSCOPY WITH ROTATOR CUFF REPAIR;  Surgeon: Vanetta Mulders, MD;  Location: [place];  Service: Orthopedics;  Laterality: Right;  ? TONSILLECTOMY    ? ? ?Current Medications: ?Current Meds  ?Medication Sig  ? aspirin EC 81 MG tablet Take 1 tablet (81 mg total) by mouth daily.  ? Calcium 200 MG TABS Take 400 mg by mouth daily.  ? ibuprofen  (ADVIL,MOTRIN) 200 MG tablet Take 200 mg by mouth every 6 (six) hours as needed.  ? lisinopril (ZESTRIL) 30 MG tablet TAKE 1 TABLET BY MOUTH DAILY. (PLEASE MAKE YEARLY APPT WITH DR. Radford Pax FOR JAN FOR FUTURE REFILLS)  ? Multiple Vitamins-Minerals (MULTIVITAMIN,TX-MINERALS) tablet Take 1 tablet by mouth daily.    ? Omega-3 Fatty Acids (FISH OIL) 1200 MG CAPS Take 1,200 mg by mouth.  ? omeprazole (PRILOSEC) 40 MG capsule Take 1 capsule (40 mg total) by mouth daily.  ? POTASSIUM PO Take 200 mg by mouth daily.  ? simvastatin (ZOCOR) 40 MG tablet TAKE 1 TABLET BY MOUTH EVERYDAY AT BEDTIME  ? vardenafil (LEVITRA) 20 MG tablet Take 1 tablet (20 mg total) by mouth as needed for erectile dysfunction.  ? ? ?Allergies:   Patient has no known allergies.  ? ?Social History  ? ?Socioeconomic History  ? Marital status: Divorced  ?  Spouse name: Not on file  ? Number of children: Not on file  ? Years of education: Not on file  ? Highest education level: Not on file  ?Occupational History  ? Not on file  ?Tobacco Use  ? Smoking status: Never  ? Smokeless tobacco: Never  ?Substance and Sexual Activity  ? Alcohol use: No  ?  Alcohol/week: 0.0 standard drinks  ? Drug use:  No  ? Sexual activity: Not on file  ?Other Topics Concern  ? Not on file  ?Social History Narrative  ? Not on file  ? ?Social Determinants of Health  ? ?Financial Resource Strain: Not on file  ?Food Insecurity: Not on file  ?Transportation Needs: Not on file  ?Physical Activity: Not on file  ?Stress: Not on file  ?Social Connections: Not on file  ?  ? ?Family History:  The patient's family history includes Cancer in his mother; Heart attack (age of onset: 11) in his father.  ? ?ROS:   ?Please see the history of present illness.    ?ROS All other systems reviewed and are negative. ? ?   ? View : No data to display.  ?  ?  ?  ? ? ? ? ? ?PHYSICAL EXAM:   ?VS:  BP 134/82   Pulse 86   Ht '6\' 1"'$  (1.854 m)   Wt 287 lb (130.2 kg)   SpO2 94%   BMI 37.87 kg/m?    ?GEN: Well  nourished, well developed, in no acute distress  ?HEENT: normal  ?Neck: no JVD, carotid bruits, or masses ?Cardiac: RRR; no murmurs, rubs, or gallops,no edema.  Intact distal pulses bilaterally.  ?Respiratory:  clear to auscultation bilaterally, normal work of breathing ?GI: soft, nontender, nondistended, + BS ?MS: no deformity or atrophy  ?Skin: warm and dry, no rash ?Neuro:  Alert and Oriented x 3, Strength and sensation are intact ?Psych: euthymic mood, full affect ? ?Wt Readings from Last 3 Encounters:  ?01/04/22 287 lb (130.2 kg)  ?08/30/21 (!) 301 lb (136.5 kg)  ?06/13/21 292 lb 15.9 oz (132.9 kg)  ?  ? ? ?Studies/Labs Reviewed:  ? ?EKG:  EKG is not ordered today.   ? ?Recent Labs: ?08/30/2021: ALT 63; BUN 17; Creatinine, Ser 1.01; Hemoglobin 16.1; Platelets 305.0; Potassium 4.2; Sodium 138; TSH 3.74  ? ?Lipid Panel ?   ?Component Value Date/Time  ? CHOL 181 08/30/2021 0852  ? CHOL 132 12/29/2016 0837  ? TRIG 345.0 (H) 08/30/2021 6045  ? HDL 33.30 (L) 08/30/2021 4098  ? HDL 33 (L) 12/29/2016 0837  ? CHOLHDL 5 08/30/2021 0852  ? VLDL 69.0 (H) 08/30/2021 1191  ? [place] 58 12/29/2016 0837  ? LDLDIRECT 125.0 08/30/2021 0852  ? ? ?Additional studies/ records that were reviewed today include:  ?none ? ? ? ?ASSESSMENT:   ? ?1. [place] coronary artery calcium score less than 100   ?2. Essential hypertension   ?3. Pure hypercholesterolemia   ? ? ? ?PLAN:  ?In order of problems listed above: ? ?Coronary artery calcifications/Family hx of premature CAD ?-his father died of an MI at 62 and several paternal uncles with heart disease at a young age ?-His coronary calcium score was 33 in 2018. ?-He denies any anginal symptoms ?-Continue preventive therapy with aspirin 81 mg daily and statin therapy ? ?2.  HTN ?-BP is adequately controlled on exam today ?-Continue prescription drug management with lisinopril 30 mg daily with as needed refills ? ?3.  Hyperlipidemia ?-LDL goal less than 70 related to coronary  calcifications ?-I have personally reviewed and interpreted outside labs performed by patient's PCP which showed LDL 125, HDL 33, triglycerides 345 ?-Change simvastatin 40 mg daily to Crestor 40 mg daily ?-Repeat FLP and ALT in 6 weeks ?-He is now been diagnosed as a diabetic and needs to get his blood sugars under control as it will cause elevated triglycerides ?-encouraged him to lose weight ? ?  Time Spent: ?20 minutes total time of encounter, including 15 minutes spent in face-to-face patient care on the date of this encounter. This time includes coordination of care and counseling regarding above mentioned problem list. Remainder of non-face-to-face time involved reviewing chart documents/testing relevant to the patient encounter and documentation in the medical record. I have independently reviewed documentation from referring provider ? ?Medication Adjustments/Labs and Tests Ordered: ?Current medicines are reviewed at length with the patient today.  Concerns regarding medicines are outlined above.  Medication changes, Labs and Tests ordered today are listed in the Patient Instructions below. ? ?There are no Patient Instructions on file for this visit. ? ? ?Signed, ?Fransico Him, MD  ?01/04/2022 8:38 AM    ?[facility] ?[place], [place],   [address] ?Phone: [phone]; Fax: [phone]  ? ?

## 2022-01-04 NOTE — Patient Instructions (Signed)
Medication Instructions:  ?Your physician has recommended you make the following change in your medication: ?1) STOP taking Zocor (simvastatin) ?2) START taking Crestor (rosuvastatin) 40 mg daily  ?*If you need a refill on your cardiac medications before your next appointment, please call your pharmacy* ? ? ?Lab Work: ?Fasting lipids and ALT on June 14th between 7:15am and 5:00pm ?If you have labs (blood work) drawn today and your tests are completely normal, you will receive your results only by: ?MyChart Message (if you have MyChart) OR ?A paper copy in the mail ?If you have any lab test that is abnormal or we need to change your treatment, we will call you to review the results. ? ? ?Follow-Up: ?At [facility], you and your health needs are our priority.  As part of our continuing mission to provide you with exceptional heart care, we have created designated Provider Care Teams.  These Care Teams include your primary Cardiologist (physician) and Advanced Practice Providers (APPs -  Physician Assistants and Nurse Practitioners) who all work together to provide you with the care you need, when you need it. ? ? ?Your next appointment:   ?1 year(s) ? ?The format for your next appointment:   ?In Person ? ?Provider:   ?Fransico Him, MD   ? ? ?Important Information About Sugar ? ? ? ? ?  ?

## 2022-01-13 ENCOUNTER — Other Ambulatory Visit (HOSPITAL_BASED_OUTPATIENT_CLINIC_OR_DEPARTMENT_OTHER): Payer: Self-pay

## 2022-01-13 MED ORDER — AMOXICILLIN 875 MG PO TABS
ORAL_TABLET | ORAL | 0 refills | Status: DC
  Filled 2022-01-13: qty 20, 10d supply, fill #0

## 2022-02-13 ENCOUNTER — Other Ambulatory Visit (HOSPITAL_BASED_OUTPATIENT_CLINIC_OR_DEPARTMENT_OTHER): Payer: Self-pay

## 2022-02-15 ENCOUNTER — Other Ambulatory Visit: Payer: Managed Care, Other (non HMO)

## 2022-02-15 DIAGNOSIS — R931 Abnormal findings on diagnostic imaging of heart and coronary circulation: Secondary | ICD-10-CM

## 2022-02-15 DIAGNOSIS — I10 Essential (primary) hypertension: Secondary | ICD-10-CM

## 2022-02-15 DIAGNOSIS — E7800 Pure hypercholesterolemia, unspecified: Secondary | ICD-10-CM

## 2022-02-15 LAB — LIPID PANEL
Chol/HDL Ratio: 4.4 ratio (ref 0.0–5.0)
Cholesterol, Total: 124 mg/dL (ref 100–199)
HDL: 28 mg/dL — ABNORMAL LOW (ref >39)
LDL Chol Calc (NIH): 60 mg/dL (ref 0–99)
Triglycerides: 219 mg/dL — ABNORMAL HIGH (ref 0–149)
VLDL Cholesterol Cal: 36 mg/dL (ref 5–40)

## 2022-02-15 LAB — ALT: ALT: 42 IU/L (ref 0–44)

## 2022-02-16 ENCOUNTER — Telehealth: Payer: Self-pay

## 2022-02-16 DIAGNOSIS — E7800 Pure hypercholesterolemia, unspecified: Secondary | ICD-10-CM

## 2022-02-16 NOTE — Telephone Encounter (Signed)
-----   Message from Sueanne Margarita, MD sent at 02/15/2022 10:26 PM EDT ----- Please find out if patient was fasting for lipids.

## 2022-02-16 NOTE — Telephone Encounter (Signed)
Received call from patient to discuss lab results.  Per Dr. Radford Pax: Please find out if patient was fasting for lipids.  Patient states he was fasting when he had his blood drawn for lipid panel on 02/15/22.  Will forward to Dr. Radford Pax to review and advise.

## 2022-02-17 NOTE — Addendum Note (Signed)
Addended by: Molli Barrows on: 02/17/2022 11:14 AM   Modules accepted: Orders

## 2022-02-17 NOTE — Telephone Encounter (Signed)
Patient agreeable to being seen in [facility] for management of elevated triglycerides.  Referral placed, scheduler to call and make appt. Patient verbalized understanding.

## 2022-02-17 NOTE — Telephone Encounter (Signed)
Left message for patient to call back  

## 2022-03-13 ENCOUNTER — Other Ambulatory Visit (HOSPITAL_BASED_OUTPATIENT_CLINIC_OR_DEPARTMENT_OTHER): Payer: Self-pay

## 2022-03-14 ENCOUNTER — Ambulatory Visit: Payer: Managed Care, Other (non HMO)

## 2022-03-29 ENCOUNTER — Ambulatory Visit: Payer: Managed Care, Other (non HMO)

## 2022-04-10 ENCOUNTER — Other Ambulatory Visit (HOSPITAL_BASED_OUTPATIENT_CLINIC_OR_DEPARTMENT_OTHER): Payer: Self-pay

## 2022-04-17 ENCOUNTER — Other Ambulatory Visit (HOSPITAL_BASED_OUTPATIENT_CLINIC_OR_DEPARTMENT_OTHER): Payer: Self-pay

## 2022-04-17 ENCOUNTER — Ambulatory Visit: Payer: Managed Care, Other (non HMO) | Admitting: Pharmacist

## 2022-04-17 DIAGNOSIS — E7800 Pure hypercholesterolemia, unspecified: Secondary | ICD-10-CM

## 2022-04-17 DIAGNOSIS — R931 Abnormal findings on diagnostic imaging of heart and coronary circulation: Secondary | ICD-10-CM | POA: Diagnosis not present

## 2022-04-17 DIAGNOSIS — Z713 Dietary counseling and surveillance: Secondary | ICD-10-CM | POA: Diagnosis not present

## 2022-04-17 MED ORDER — VASCEPA 1 G PO CAPS
2.0000 g | ORAL_CAPSULE | Freq: Two times a day (BID) | ORAL | 11 refills | Status: DC
  Filled 2022-04-17: qty 120, 30d supply, fill #0
  Filled 2022-05-19: qty 120, 30d supply, fill #1
  Filled 2022-06-17: qty 120, 30d supply, fill #2
  Filled 2022-07-20: qty 120, 30d supply, fill #3
  Filled 2022-08-16: qty 120, 30d supply, fill #4
  Filled 2022-09-14: qty 120, 30d supply, fill #5
  Filled 2022-10-16 – 2022-10-17 (×3): qty 120, 30d supply, fill #6
  Filled 2022-11-16 – 2022-12-11 (×3): qty 120, 30d supply, fill #7

## 2022-04-17 NOTE — Progress Notes (Signed)
Patient ID: Lee Ballard                 DOB: Jul 13, 1966                    MRN: [id number]     HPI: Lee Ballard is a 56 y.o. male patient referred to lipid clinic by Dr Radford Pax. PMH is significant for HTN, HLD, DM, LE edema, FHx premature CAD, and elevated calcium score of 37 in 10/2016 (75th percentile for age and sex matched control). Pt was changed from simvastatin to rosuvastatin in May. F/u TG remained elevated and pt was referred to lipid clinic.  Pt presents today for follow up. Has been focusing on dietary changes since he was diagnosed with DM in December (A1c mildly elevated at 6.6%). Has been taking rosuvastatin and OTC fish oil without side effects.  Current Medications: rosuvastatin '40mg'$  daily, OTC fish oil 1,'200mg'$  daily Risk Factors: elevated calcium score, FHx premature CAD, DM LDL goal: '70mg'$ /dL; TG goal: < 150  Diet: Breakfast - oatmeal or cheerios. Lunch - sandwich. Dinner - fish or chicken, veggie and starch, eats at home. Avoids red meat. Snacks - crackers. Drinks - coffee, water.   Exercise: Walks at work, walks the dog for a 1 mile each day.  Family History: Father died of MI at age 62, several paternal uncles with heart disease at a young age.  Social History: Denies alcohol, tobacco and drug use.  Labs: 02/15/22: TC 124, TG 219, HDL 28, LDL 60, ALT 42 (rosuvastatin '40mg'$  daily) 08/30/21: TC 181, TG 345, HDL 33.3, LDL-D 125, ALT 63 (simvastatin '40mg'$  daily)  Past Medical History:  Diagnosis Date   Agatston coronary artery calcium score less than 100    coronary Ca score 37 in 2018   Edema leg    Erectile dysfunction    Family history of premature CAD 09/28/2016   GERD (gastroesophageal reflux disease)    Hyperlipidemia    Hypertension    Varicose vein of leg     Current Outpatient Medications on File Prior to Visit  Medication Sig Dispense Refill   amoxicillin (AMOXIL) 875 MG tablet Take 1 tablet by mouth every 12 hours. 20 tablet 0   aspirin EC 81 MG  tablet Take 1 tablet (81 mg total) by mouth daily. 90 tablet 3   Calcium 200 MG TABS Take 400 mg by mouth daily.     ibuprofen (ADVIL,MOTRIN) 200 MG tablet Take 200 mg by mouth every 6 (six) hours as needed.     lisinopril (ZESTRIL) 30 MG tablet TAKE 1 TABLET BY MOUTH DAILY. (PLEASE MAKE YEARLY APPT WITH DR. Radford Pax FOR JAN FOR FUTURE REFILLS) 90 tablet 3   Multiple Vitamins-Minerals (MULTIVITAMIN,TX-MINERALS) tablet Take 1 tablet by mouth daily.       Omega-3 Fatty Acids (FISH OIL) 1200 MG CAPS Take 1,200 mg by mouth.     omeprazole (PRILOSEC) 40 MG capsule Take 1 capsule (40 mg total) by mouth daily. 90 capsule 3   POTASSIUM PO Take 200 mg by mouth daily.     rosuvastatin (CRESTOR) 40 MG tablet Take 1 tablet (40 mg total) by mouth daily. 90 tablet 3   vardenafil (LEVITRA) 20 MG tablet Take 1 tablet (20 mg total) by mouth as needed for erectile dysfunction. 3 tablet 11   No current facility-administered medications on file prior to visit.    No Known Allergies  Assessment/Plan:  1. Hyperlipidemia - LDL 60 on rosuvastatin '40mg'$  daily, at goal <  70 due to elevated calcium score. TG have consistently been in the 200-300 range above goal < 150. Discussed lifestyle modifications including aiming for 150 minutes of weekly activity and limiting carbs/sugar intake which will also help with his A1c. Will change OTc fish oil to Vascepa 2g BID for better TG reduction and added CV benefit. Prior authorization submitted and approved through 04/17/23. Pt provided with $9 copay card during visit today to bring to the pharmacy. Will recheck fasting lipids in 3 months.   Lee Ballard E. Leanore Biggers, PharmD, BCACP, [place] [address] [place], [place] [address] Phone: [phone]; Fax: [phone] 04/17/2022 10:54 AM

## 2022-04-17 NOTE — Patient Instructions (Signed)
Your LDL cholesterol is at goal < 70, continue taking rosuvastatin (Crestor) each day.  Your triglycerides are mildly elevated at 219 and your goal is < 150. Stop taking over the counter fish oil and replace it with prescription fish oil called Vascepa. This will be 2 capsules twice a day with food.  Recheck fasting labs on Monday, November 6th any time after 7:30am.

## 2022-05-09 ENCOUNTER — Other Ambulatory Visit (HOSPITAL_BASED_OUTPATIENT_CLINIC_OR_DEPARTMENT_OTHER): Payer: Self-pay

## 2022-05-12 ENCOUNTER — Other Ambulatory Visit: Payer: Self-pay

## 2022-05-12 MED ORDER — ROSUVASTATIN CALCIUM 40 MG PO TABS: 40.0000 mg | ORAL_TABLET | Freq: Every day | ORAL | 3 refills | Status: DC

## 2022-05-17 ENCOUNTER — Other Ambulatory Visit: Payer: Self-pay

## 2022-05-17 MED ORDER — LISINOPRIL 30 MG PO TABS: ORAL_TABLET | ORAL | 0 refills | Status: DC

## 2022-05-22 ENCOUNTER — Other Ambulatory Visit (HOSPITAL_BASED_OUTPATIENT_CLINIC_OR_DEPARTMENT_OTHER): Payer: Self-pay

## 2022-05-23 ENCOUNTER — Other Ambulatory Visit (HOSPITAL_BASED_OUTPATIENT_CLINIC_OR_DEPARTMENT_OTHER): Payer: Self-pay

## 2022-06-17 ENCOUNTER — Other Ambulatory Visit (HOSPITAL_BASED_OUTPATIENT_CLINIC_OR_DEPARTMENT_OTHER): Payer: Self-pay

## 2022-06-19 ENCOUNTER — Other Ambulatory Visit (HOSPITAL_BASED_OUTPATIENT_CLINIC_OR_DEPARTMENT_OTHER): Payer: Self-pay

## 2022-07-10 ENCOUNTER — Ambulatory Visit: Payer: Managed Care, Other (non HMO) | Attending: Cardiology

## 2022-07-10 DIAGNOSIS — E7800 Pure hypercholesterolemia, unspecified: Secondary | ICD-10-CM

## 2022-07-10 LAB — LIPID PANEL
Chol/HDL Ratio: 3.2 ratio (ref 0.0–5.0)
Cholesterol, Total: 102 mg/dL (ref 100–199)
HDL: 32 mg/dL — ABNORMAL LOW (ref >39)
LDL Chol Calc (NIH): 40 mg/dL (ref 0–99)
Triglycerides: 184 mg/dL — ABNORMAL HIGH (ref 0–149)
VLDL Cholesterol Cal: 30 mg/dL (ref 5–40)

## 2022-07-18 ENCOUNTER — Telehealth: Payer: Self-pay

## 2022-07-18 DIAGNOSIS — E7800 Pure hypercholesterolemia, unspecified: Secondary | ICD-10-CM

## 2022-07-18 NOTE — Telephone Encounter (Signed)
The patient has been notified of the result and verbalized understanding.  All questions (if any) were answered. Bernestine Amass, RN 07/18/2022 4:53 PM

## 2022-07-18 NOTE — Telephone Encounter (Signed)
-----   Message from Sueanne Margarita, MD sent at 07/13/2022  8:20 AM EST ----- Have patient focus on low-carb diet and exercise and recheck FLP in 4 months ----- Message ----- From: Ramond Dial, RPH-CPP Sent: 07/13/2022   7:29 AM EST To: Sueanne Margarita, MD; Bernestine Amass, RN  TG are much better than previous but still a little high. He is on Vascepa which is the only TG lowering medication with CV benefit. Could consider adding fenofibrate which would give some microvascular risk reduction since he is diabetic. Patient ideally should focus on a lower carb, low saturated fat diet and plenty of exercise.

## 2022-07-20 ENCOUNTER — Other Ambulatory Visit (HOSPITAL_BASED_OUTPATIENT_CLINIC_OR_DEPARTMENT_OTHER): Payer: Self-pay

## 2022-09-06 ENCOUNTER — Other Ambulatory Visit (HOSPITAL_BASED_OUTPATIENT_CLINIC_OR_DEPARTMENT_OTHER): Payer: Self-pay

## 2022-09-06 ENCOUNTER — Other Ambulatory Visit: Payer: Self-pay

## 2022-09-06 ENCOUNTER — Other Ambulatory Visit: Payer: Self-pay | Admitting: Family Medicine

## 2022-09-06 MED ORDER — ROSUVASTATIN CALCIUM 40 MG PO TABS
40.0000 mg | ORAL_TABLET | Freq: Every day | ORAL | 1 refills | Status: DC
  Filled 2022-09-06: qty 30, 30d supply, fill #0
  Filled 2022-10-06: qty 30, 30d supply, fill #1
  Filled 2022-11-07: qty 30, 30d supply, fill #2
  Filled 2022-12-05: qty 30, 30d supply, fill #3
  Filled 2023-01-05: qty 30, 30d supply, fill #4
  Filled 2023-02-04: qty 30, 30d supply, fill #5

## 2022-09-06 MED ORDER — LISINOPRIL 30 MG PO TABS
ORAL_TABLET | ORAL | 0 refills | Status: DC
  Filled 2022-09-06: qty 30, 30d supply, fill #0

## 2022-09-10 ENCOUNTER — Other Ambulatory Visit: Payer: Self-pay | Admitting: Family Medicine

## 2022-09-11 ENCOUNTER — Other Ambulatory Visit (HOSPITAL_BASED_OUTPATIENT_CLINIC_OR_DEPARTMENT_OTHER): Payer: Self-pay

## 2022-09-11 MED ORDER — OMEPRAZOLE 40 MG PO CPDR
40.0000 mg | DELAYED_RELEASE_CAPSULE | Freq: Every day | ORAL | 0 refills | Status: DC
  Filled 2022-09-11: qty 30, 30d supply, fill #0

## 2022-09-12 ENCOUNTER — Other Ambulatory Visit (HOSPITAL_BASED_OUTPATIENT_CLINIC_OR_DEPARTMENT_OTHER): Payer: Self-pay

## 2022-09-15 ENCOUNTER — Encounter: Payer: Self-pay | Admitting: Family Medicine

## 2022-09-15 ENCOUNTER — Other Ambulatory Visit: Payer: Managed Care, Other (non HMO)

## 2022-09-15 ENCOUNTER — Ambulatory Visit (INDEPENDENT_AMBULATORY_CARE_PROVIDER_SITE_OTHER): Payer: Managed Care, Other (non HMO) | Admitting: Family Medicine

## 2022-09-15 ENCOUNTER — Other Ambulatory Visit (HOSPITAL_BASED_OUTPATIENT_CLINIC_OR_DEPARTMENT_OTHER): Payer: Self-pay

## 2022-09-15 VITALS — BP 132/84 | HR 76 | Ht 73.0 in | Wt 292.0 lb

## 2022-09-15 DIAGNOSIS — Z0000 Encounter for general adult medical examination without abnormal findings: Secondary | ICD-10-CM | POA: Diagnosis not present

## 2022-09-15 DIAGNOSIS — Z125 Encounter for screening for malignant neoplasm of prostate: Secondary | ICD-10-CM | POA: Diagnosis not present

## 2022-09-15 DIAGNOSIS — Z23 Encounter for immunization: Secondary | ICD-10-CM

## 2022-09-15 LAB — CBC WITH DIFFERENTIAL/PLATELET
Basophils Absolute: 0.1 10*3/uL (ref 0.0–0.1)
Basophils Relative: 0.8 % (ref 0.0–3.0)
Eosinophils Absolute: 0.2 10*3/uL (ref 0.0–0.7)
Eosinophils Relative: 2.5 % (ref 0.0–5.0)
HCT: 47.4 % (ref 39.0–52.0)
Hemoglobin: 16 g/dL (ref 13.0–17.0)
Lymphocytes Relative: 31 % (ref 12.0–46.0)
Lymphs Abs: 2.7 10*3/uL (ref 0.7–4.0)
MCHC: 33.7 g/dL (ref 30.0–36.0)
MCV: 91.5 fl (ref 78.0–100.0)
Monocytes Absolute: 0.8 10*3/uL (ref 0.1–1.0)
Monocytes Relative: 9.6 % (ref 3.0–12.0)
Neutro Abs: 4.9 10*3/uL (ref 1.4–7.7)
Neutrophils Relative %: 56.1 % (ref 43.0–77.0)
Platelets: 313 10*3/uL (ref 150.0–400.0)
RBC: 5.18 Mil/uL (ref 4.22–5.81)
RDW: 14.3 % (ref 11.5–15.5)
WBC: 8.7 10*3/uL (ref 4.0–10.5)

## 2022-09-15 LAB — BASIC METABOLIC PANEL
BUN: 19 mg/dL (ref 6–23)
CO2: 26 mEq/L (ref 19–32)
Calcium: 9.6 mg/dL (ref 8.4–10.5)
Chloride: 102 mEq/L (ref 96–112)
Creatinine, Ser: 1 mg/dL (ref 0.40–1.50)
GFR: 83.09 mL/min (ref >60.00)
Glucose, Bld: 127 mg/dL — ABNORMAL HIGH (ref 70–99)
Potassium: 4.3 mEq/L (ref 3.5–5.1)
Sodium: 138 mEq/L (ref 135–145)

## 2022-09-15 LAB — HEPATIC FUNCTION PANEL
ALT: 38 U/L (ref 0–53)
AST: 31 U/L (ref 0–37)
Albumin: 4.4 g/dL (ref 3.5–5.2)
Alkaline Phosphatase: 142 U/L — ABNORMAL HIGH (ref 39–117)
Bilirubin, Direct: 0.1 mg/dL (ref 0.0–0.3)
Total Bilirubin: 0.5 mg/dL (ref 0.2–1.2)
Total Protein: 7.3 g/dL (ref 6.0–8.3)

## 2022-09-15 LAB — PSA: PSA: 0.76 ng/mL (ref 0.10–4.00)

## 2022-09-15 LAB — TSH: TSH: 2.34 u[IU]/mL (ref 0.35–5.50)

## 2022-09-15 MED ORDER — LISINOPRIL 30 MG PO TABS
30.0000 mg | ORAL_TABLET | Freq: Every day | ORAL | 11 refills | Status: DC
  Filled 2022-09-15 – 2022-09-20 (×3): qty 30, 30d supply, fill #0
  Filled 2022-10-19 – 2022-10-20 (×2): qty 30, 30d supply, fill #1
  Filled 2022-11-17 – 2022-11-19 (×2): qty 30, 30d supply, fill #2
  Filled 2022-12-05 – 2022-12-19 (×3): qty 30, 30d supply, fill #3
  Filled 2023-01-16 – 2023-01-18 (×2): qty 30, 30d supply, fill #4
  Filled 2023-02-13 – 2023-02-17 (×3): qty 30, 30d supply, fill #5
  Filled 2023-03-19: qty 30, 30d supply, fill #6
  Filled 2023-04-24: qty 30, 30d supply, fill #7
  Filled 2023-05-22: qty 30, 30d supply, fill #8
  Filled 2023-06-21: qty 30, 30d supply, fill #9
  Filled 2023-07-21 – 2023-07-27 (×2): qty 30, 30d supply, fill #10
  Filled 2023-08-21: qty 30, 30d supply, fill #11

## 2022-09-15 MED ORDER — OMEPRAZOLE 40 MG PO CPDR
40.0000 mg | DELAYED_RELEASE_CAPSULE | Freq: Every day | ORAL | 11 refills | Status: DC
  Filled 2022-09-15 – 2022-10-06 (×2): qty 30, 30d supply, fill #0
  Filled 2022-11-11 – 2022-11-20 (×2): qty 30, 30d supply, fill #1
  Filled 2022-12-05 – 2022-12-19 (×2): qty 30, 30d supply, fill #2
  Filled 2023-01-16: qty 30, 30d supply, fill #3
  Filled 2023-02-13: qty 30, 30d supply, fill #4
  Filled 2023-03-19: qty 30, 30d supply, fill #5
  Filled 2023-04-24: qty 30, 30d supply, fill #6
  Filled 2023-05-22: qty 30, 30d supply, fill #7
  Filled 2023-06-21: qty 30, 30d supply, fill #8
  Filled 2023-07-21 – 2023-07-27 (×2): qty 30, 30d supply, fill #9
  Filled 2023-08-21: qty 30, 30d supply, fill #10

## 2022-09-15 NOTE — Progress Notes (Signed)
   Subjective:    Patient ID: Lee Ballard, male    DOB: 07-18-1966, 57 y.o.   MRN: [id number]  HPI Here for a well exam. He feels fine. He saw Dr. Radford Pax in May and had a lipid panel checked.    Review of Systems  Constitutional: Negative.   HENT: Negative.    Eyes: Negative.   Respiratory: Negative.    Cardiovascular: Negative.   Gastrointestinal: Negative.   Genitourinary: Negative.   Musculoskeletal: Negative.   Skin: Negative.   Neurological: Negative.   Psychiatric/Behavioral: Negative.         Objective:   Physical Exam Constitutional:      General: He is not in acute distress.    Appearance: He is well-developed. He is obese. He is not diaphoretic.  HENT:     Head: Normocephalic and atraumatic.     Right Ear: External ear normal.     Left Ear: External ear normal.     Nose: Nose normal.     Mouth/Throat:     Pharynx: No oropharyngeal exudate.  Eyes:     General: No scleral icterus.       Right eye: No discharge.        Left eye: No discharge.     Conjunctiva/sclera: Conjunctivae normal.     Pupils: Pupils are equal, round, and reactive to light.  Neck:     Thyroid: No thyromegaly.     Vascular: No JVD.     Trachea: No tracheal deviation.  Cardiovascular:     Rate and Rhythm: Normal rate and regular rhythm.     Heart sounds: Normal heart sounds. No murmur heard.    No friction rub. No gallop.  Pulmonary:     Effort: Pulmonary effort is normal. No respiratory distress.     Breath sounds: Normal breath sounds. No wheezing or rales.  Chest:     Chest wall: No tenderness.  Abdominal:     General: Bowel sounds are normal. There is no distension.     Palpations: Abdomen is soft. There is no mass.     Tenderness: There is no abdominal tenderness. There is no guarding or rebound.  Genitourinary:    Penis: Normal. No tenderness.      Testes: Normal.     Prostate: Normal.     Rectum: Normal. Guaiac result negative.  Musculoskeletal:        General: No  tenderness. Normal range of motion.     Cervical back: Neck supple.  Lymphadenopathy:     Cervical: No cervical adenopathy.  Skin:    General: Skin is warm and dry.     Coloration: Skin is not pale.     Findings: No erythema or rash.  Neurological:     Mental Status: He is alert and oriented to person, place, and time.     Cranial Nerves: No cranial nerve deficit.     Motor: No abnormal muscle tone.     Coordination: Coordination normal.     Deep Tendon Reflexes: Reflexes are normal and symmetric. Reflexes normal.  Psychiatric:        Behavior: Behavior normal.        Thought Content: Thought content normal.        Judgment: Judgment normal.           Assessment & Plan:  Well exam. We discussed diet and exercise. Get fasting labs. Alysia Penna, MD

## 2022-09-16 LAB — HEMOGLOBIN A1C
Hgb A1c MFr Bld: 7 % of total Hgb — ABNORMAL HIGH (ref <5.7)
Mean Plasma Glucose: 154 mg/dL
eAG (mmol/L): 8.5 mmol/L

## 2022-09-20 ENCOUNTER — Other Ambulatory Visit (HOSPITAL_BASED_OUTPATIENT_CLINIC_OR_DEPARTMENT_OTHER): Payer: Self-pay

## 2022-09-21 ENCOUNTER — Other Ambulatory Visit (HOSPITAL_BASED_OUTPATIENT_CLINIC_OR_DEPARTMENT_OTHER): Payer: Self-pay

## 2022-10-06 ENCOUNTER — Other Ambulatory Visit (HOSPITAL_BASED_OUTPATIENT_CLINIC_OR_DEPARTMENT_OTHER): Payer: Self-pay

## 2022-10-06 ENCOUNTER — Other Ambulatory Visit: Payer: Self-pay

## 2022-10-16 ENCOUNTER — Other Ambulatory Visit (HOSPITAL_BASED_OUTPATIENT_CLINIC_OR_DEPARTMENT_OTHER): Payer: Self-pay

## 2022-10-17 ENCOUNTER — Other Ambulatory Visit (HOSPITAL_BASED_OUTPATIENT_CLINIC_OR_DEPARTMENT_OTHER): Payer: Self-pay

## 2022-10-17 ENCOUNTER — Other Ambulatory Visit: Payer: Self-pay

## 2022-10-18 ENCOUNTER — Other Ambulatory Visit (HOSPITAL_BASED_OUTPATIENT_CLINIC_OR_DEPARTMENT_OTHER): Payer: Self-pay

## 2022-10-20 ENCOUNTER — Other Ambulatory Visit (HOSPITAL_BASED_OUTPATIENT_CLINIC_OR_DEPARTMENT_OTHER): Payer: Self-pay

## 2022-11-13 ENCOUNTER — Other Ambulatory Visit (HOSPITAL_BASED_OUTPATIENT_CLINIC_OR_DEPARTMENT_OTHER): Payer: Self-pay

## 2022-11-16 ENCOUNTER — Ambulatory Visit: Payer: Managed Care, Other (non HMO) | Attending: Cardiology

## 2022-11-16 ENCOUNTER — Other Ambulatory Visit (HOSPITAL_COMMUNITY): Payer: Self-pay

## 2022-11-16 DIAGNOSIS — E7800 Pure hypercholesterolemia, unspecified: Secondary | ICD-10-CM

## 2022-11-16 LAB — LIPID PANEL
Chol/HDL Ratio: 3.7 ratio (ref 0.0–5.0)
Cholesterol, Total: 110 mg/dL (ref 100–199)
HDL: 30 mg/dL — ABNORMAL LOW (ref >39)
LDL Chol Calc (NIH): 51 mg/dL (ref 0–99)
Triglycerides: 172 mg/dL — ABNORMAL HIGH (ref 0–149)
VLDL Cholesterol Cal: 29 mg/dL (ref 5–40)

## 2022-11-17 ENCOUNTER — Other Ambulatory Visit (HOSPITAL_BASED_OUTPATIENT_CLINIC_OR_DEPARTMENT_OTHER): Payer: Self-pay

## 2022-11-17 ENCOUNTER — Telehealth: Payer: Self-pay

## 2022-11-17 ENCOUNTER — Other Ambulatory Visit (HOSPITAL_COMMUNITY): Payer: Self-pay

## 2022-11-17 ENCOUNTER — Other Ambulatory Visit: Payer: Self-pay

## 2022-11-17 NOTE — Telephone Encounter (Signed)
Attempted to call patient to discuss lab results, called both numbers listed on DPR and no answer.

## 2022-11-17 NOTE — Telephone Encounter (Signed)
-----   Message from Sueanne Margarita, MD sent at 11/17/2022  8:01 AM EDT ----- Triglycerides remain elevated please confirm patient is taking his Vascepa and statin without any missed doses and that this was a fasting lipid panel

## 2022-11-20 ENCOUNTER — Other Ambulatory Visit (HOSPITAL_BASED_OUTPATIENT_CLINIC_OR_DEPARTMENT_OTHER): Payer: Self-pay

## 2022-11-21 ENCOUNTER — Other Ambulatory Visit: Payer: Self-pay

## 2022-11-22 ENCOUNTER — Other Ambulatory Visit (HOSPITAL_BASED_OUTPATIENT_CLINIC_OR_DEPARTMENT_OTHER): Payer: Self-pay

## 2022-11-23 ENCOUNTER — Other Ambulatory Visit (HOSPITAL_BASED_OUTPATIENT_CLINIC_OR_DEPARTMENT_OTHER): Payer: Self-pay

## 2022-12-01 ENCOUNTER — Telehealth: Payer: Self-pay | Admitting: Cardiology

## 2022-12-01 ENCOUNTER — Telehealth: Payer: Self-pay

## 2022-12-01 ENCOUNTER — Other Ambulatory Visit (HOSPITAL_BASED_OUTPATIENT_CLINIC_OR_DEPARTMENT_OTHER): Payer: Self-pay

## 2022-12-01 NOTE — Telephone Encounter (Signed)
Called patient to discuss recent cholesterol labs and Dr. Theodosia Blender follow up questions. Reviewed triglycerides remain elevated. Patient verbalizes understanding and states he was fasting for 11/16/22 labs, and states he has been taking his vascepa regularly. Forwarded patient's responses to Dr. Radford Pax.

## 2022-12-01 NOTE — Telephone Encounter (Signed)
Called to discuss recent lipid labs, no answer. Left message with no identifiers to ask patient to call office.

## 2022-12-01 NOTE — Telephone Encounter (Signed)
Patient called in to request help with vascepa coupon. He states he tried to refill his vascepa using his coupon but because of the recent health care system hack he was unable to use it at his pharmacy. He states he was quoted a price of $397 by his pharmacy. Forwarded to PharmD.

## 2022-12-01 NOTE — Telephone Encounter (Signed)
Patient is returning call.  °

## 2022-12-01 NOTE — Telephone Encounter (Signed)
-----   Message from Traci R Turner, MD sent at 11/17/2022  8:01 AM EDT ----- Triglycerides remain elevated please confirm patient is taking his Vascepa and statin without any missed doses and that this was a fasting lipid panel 

## 2022-12-04 NOTE — Telephone Encounter (Signed)
If copay card was affected by recent hack, will just need to check in with pharmacy each week and see if they're able to run the copay card again, don't have a fix for this unfortunately, and he should pick up med when able. He could try using a new copay card but assuming it would probably have the same issue. If he wanted to try, below is new copay card info he could try giving the pharmacy:  BIN#  [id number] PCN#  CN GRP#  [id number] ID#  [id number]

## 2022-12-05 ENCOUNTER — Other Ambulatory Visit: Payer: Self-pay

## 2022-12-05 ENCOUNTER — Other Ambulatory Visit (HOSPITAL_BASED_OUTPATIENT_CLINIC_OR_DEPARTMENT_OTHER): Payer: Self-pay

## 2022-12-05 NOTE — Telephone Encounter (Signed)
Attempted to call patient to discuss results, number provided on DPR out of service. Called patient on number currently listed in demographic info, no answer.  Left message with no identifying info asking patient to call our office.

## 2022-12-05 NOTE — Telephone Encounter (Signed)
Attempted to call patient to discuss results, number provided on DPR out of service. Called patient on number currently listed in demographic info, no answer.  Left message with no identifying info asking patient to call our office

## 2022-12-05 NOTE — Telephone Encounter (Signed)
Called and reviewed that patient had elevated triglycerides on recent cholesterol labs. Discussed diet and food high in sugar and carbs. Patient states he does not drink alcohol, has sweets a few times a week, but does drink a big coffee with coffee creamer everyday. He states he is aware it has a lot of sugar and agrees to look at making a diet change to reduce sugar intake with his coffee daily.

## 2022-12-05 NOTE — Telephone Encounter (Signed)
Spoke with patient regarding new copay card information for vascepa, patient states he will try this with his pharmacy and call our office if it doesn't work. Information also sent ove rMyChart.

## 2022-12-11 ENCOUNTER — Other Ambulatory Visit (HOSPITAL_BASED_OUTPATIENT_CLINIC_OR_DEPARTMENT_OTHER): Payer: Self-pay

## 2022-12-12 ENCOUNTER — Telehealth: Payer: Self-pay | Admitting: Cardiology

## 2022-12-12 ENCOUNTER — Other Ambulatory Visit (HOSPITAL_BASED_OUTPATIENT_CLINIC_OR_DEPARTMENT_OTHER): Payer: Self-pay

## 2022-12-12 MED ORDER — ICOSAPENT ETHYL 1 G PO CAPS
2.0000 g | ORAL_CAPSULE | Freq: Two times a day (BID) | ORAL | 5 refills | Status: DC
  Filled 2022-12-12: qty 120, 30d supply, fill #0
  Filled 2023-01-05: qty 120, 30d supply, fill #1
  Filled 2023-02-04: qty 120, 30d supply, fill #2
  Filled 2023-03-13: qty 120, 30d supply, fill #3
  Filled 2023-04-10: qty 120, 30d supply, fill #4
  Filled 2023-04-23 – 2023-05-22 (×2): qty 120, 30d supply, fill #5

## 2022-12-12 NOTE — Telephone Encounter (Signed)
Call to [facility] to verbally approve generic for vascepa. Order placed. Call to patient to advise Dr. Mayford Knife is fine with generic, no answer. Left message with no identifiers asking patient to call the office.

## 2022-12-12 NOTE — Telephone Encounter (Signed)
Pt c/o medication issue:  1. Name of Medication: VASCEPA 1 g capsule   2. How are you currently taking this medication (dosage and times per day)? Take 2 capsules (2 g total) by mouth 2 (two) times daily.   3. Are you having a reaction (difficulty breathing--STAT)? No  4. What is your medication issue? Pt wants to go to Generic brand of this medication due this cost of this above medication and would like a new script sent. Please advise

## 2022-12-13 ENCOUNTER — Other Ambulatory Visit (HOSPITAL_BASED_OUTPATIENT_CLINIC_OR_DEPARTMENT_OTHER): Payer: Self-pay

## 2022-12-19 ENCOUNTER — Other Ambulatory Visit (HOSPITAL_BASED_OUTPATIENT_CLINIC_OR_DEPARTMENT_OTHER): Payer: Self-pay

## 2023-01-12 ENCOUNTER — Telehealth: Payer: Managed Care, Other (non HMO) | Admitting: Emergency Medicine

## 2023-01-12 ENCOUNTER — Other Ambulatory Visit (HOSPITAL_BASED_OUTPATIENT_CLINIC_OR_DEPARTMENT_OTHER): Payer: Self-pay

## 2023-01-12 DIAGNOSIS — J329 Chronic sinusitis, unspecified: Secondary | ICD-10-CM | POA: Diagnosis not present

## 2023-01-12 MED ORDER — AMOXICILLIN-POT CLAVULANATE 875-125 MG PO TABS
1.0000 | ORAL_TABLET | Freq: Two times a day (BID) | ORAL | 0 refills | Status: DC
  Filled 2023-01-12: qty 14, 7d supply, fill #0

## 2023-01-12 NOTE — Progress Notes (Signed)
E-Visit for Sinus Problems  We are sorry that you are not feeling well.  Here is how we plan to help!  Based on what you have shared with me it looks like you have sinusitis.  Sinusitis is inflammation and infection in the sinus cavities of the head.  Based on your presentation I believe you most likely have Acute Bacterial Sinusitis.  This is an infection caused by bacteria and is treated with antibiotics. I have prescribed Augmentin 875mg /125mg  one tablet twice daily with food, for 7 days. You may use an oral decongestant such as Mucinex D or if you have glaucoma or high blood pressure use plain Mucinex. Saline nasal spray help and can safely be used as often as needed for congestion.  If you develop worsening sinus pain, fever or notice severe headache and vision changes, or if symptoms are not better after completion of antibiotic, please schedule an appointment with a health care provider.   This antibiotic should clear the lungs/chest as well.   Sinus infections are not as easily transmitted as other respiratory infection, however we still recommend that you avoid close contact with loved ones, especially the very young and elderly.  Remember to wash your hands thoroughly throughout the day as this is the number one way to prevent the spread of infection!  Home Care: Only take medications as instructed by your medical team. Complete the entire course of an antibiotic. Do not take these medications with alcohol. A steam or ultrasonic humidifier can help congestion.  You can place a towel over your head and breathe in the steam from hot water coming from a faucet. Avoid close contacts especially the very young and the elderly. Cover your mouth when you cough or sneeze. Always remember to wash your hands.  Get Help Right Away If: You develop worsening fever or sinus pain. You develop a severe head ache or visual changes. Your symptoms persist after you have completed your treatment  plan.  Make sure you Understand these instructions. Will watch your condition. Will get help right away if you are not doing well or get worse.  Thank you for choosing an e-visit.  Your e-visit answers were reviewed by a board certified advanced clinical practitioner to complete your personal care plan. Depending upon the condition, your plan could have included both over the counter or prescription medications.  Please review your pharmacy choice. Make sure the pharmacy is open so you can pick up prescription now. If there is a problem, you may contact your provider through [employer] and have the prescription routed to another pharmacy.  Your safety is important to [place]. If you have drug allergies check your prescription carefully.   For the next 24 hours you can use MyChart to ask questions about today's visit, request a non-urgent call back, or ask for a work or school excuse. You will get an email in the next two days asking about your experience. I hope that your e-visit has been valuable and will speed your recovery.  Approximately 5 minutes was used in reviewing the patient's chart, questionnaire, prescribing medications, and documentation.

## 2023-01-16 ENCOUNTER — Other Ambulatory Visit (HOSPITAL_BASED_OUTPATIENT_CLINIC_OR_DEPARTMENT_OTHER): Payer: Self-pay

## 2023-01-16 ENCOUNTER — Other Ambulatory Visit: Payer: Self-pay

## 2023-02-14 ENCOUNTER — Other Ambulatory Visit: Payer: Self-pay

## 2023-02-14 ENCOUNTER — Other Ambulatory Visit (HOSPITAL_BASED_OUTPATIENT_CLINIC_OR_DEPARTMENT_OTHER): Payer: Self-pay

## 2023-02-14 ENCOUNTER — Encounter (HOSPITAL_BASED_OUTPATIENT_CLINIC_OR_DEPARTMENT_OTHER): Payer: Self-pay | Admitting: Pharmacist

## 2023-02-17 ENCOUNTER — Other Ambulatory Visit (HOSPITAL_BASED_OUTPATIENT_CLINIC_OR_DEPARTMENT_OTHER): Payer: Self-pay

## 2023-03-13 ENCOUNTER — Other Ambulatory Visit (HOSPITAL_BASED_OUTPATIENT_CLINIC_OR_DEPARTMENT_OTHER): Payer: Self-pay

## 2023-03-13 ENCOUNTER — Other Ambulatory Visit: Payer: Self-pay | Admitting: Cardiology

## 2023-03-13 ENCOUNTER — Other Ambulatory Visit: Payer: Self-pay

## 2023-03-13 MED ORDER — ROSUVASTATIN CALCIUM 40 MG PO TABS
40.0000 mg | ORAL_TABLET | Freq: Every day | ORAL | 0 refills | Status: DC
  Filled 2023-03-13: qty 30, 30d supply, fill #0

## 2023-03-14 ENCOUNTER — Other Ambulatory Visit: Payer: Self-pay

## 2023-03-14 ENCOUNTER — Other Ambulatory Visit (HOSPITAL_BASED_OUTPATIENT_CLINIC_OR_DEPARTMENT_OTHER): Payer: Self-pay

## 2023-03-19 ENCOUNTER — Other Ambulatory Visit (HOSPITAL_BASED_OUTPATIENT_CLINIC_OR_DEPARTMENT_OTHER): Payer: Self-pay

## 2023-04-09 ENCOUNTER — Telehealth: Payer: Self-pay | Admitting: Pharmacy Technician

## 2023-04-09 ENCOUNTER — Other Ambulatory Visit (HOSPITAL_COMMUNITY): Payer: Self-pay

## 2023-04-09 NOTE — Telephone Encounter (Addendum)
Pharmacy Patient Advocate Encounter  Received notification from [employer] that Prior Authorization for Icosapent Ethyl 1GM capsules has been APPROVED from 04/09/2023 to 04/08/2024. Ran test claim, Copay is $0.00  PA #/Case ID/Reference #: [id number]  Key: [facility]

## 2023-04-10 ENCOUNTER — Other Ambulatory Visit: Payer: Self-pay | Admitting: Cardiology

## 2023-04-10 ENCOUNTER — Other Ambulatory Visit (HOSPITAL_BASED_OUTPATIENT_CLINIC_OR_DEPARTMENT_OTHER): Payer: Self-pay

## 2023-04-10 ENCOUNTER — Other Ambulatory Visit: Payer: Self-pay

## 2023-04-10 MED ORDER — ROSUVASTATIN CALCIUM 40 MG PO TABS
40.0000 mg | ORAL_TABLET | Freq: Every day | ORAL | 0 refills | Status: DC
  Filled 2023-04-10: qty 15, 15d supply, fill #0

## 2023-04-23 ENCOUNTER — Other Ambulatory Visit: Payer: Self-pay | Admitting: Cardiology

## 2023-04-23 ENCOUNTER — Other Ambulatory Visit (HOSPITAL_BASED_OUTPATIENT_CLINIC_OR_DEPARTMENT_OTHER): Payer: Self-pay

## 2023-04-25 ENCOUNTER — Other Ambulatory Visit (HOSPITAL_BASED_OUTPATIENT_CLINIC_OR_DEPARTMENT_OTHER): Payer: Self-pay

## 2023-05-03 ENCOUNTER — Other Ambulatory Visit: Payer: Self-pay | Admitting: Cardiology

## 2023-05-03 MED ORDER — ROSUVASTATIN CALCIUM 40 MG PO TABS: 40.0000 mg | ORAL_TABLET | Freq: Every day | ORAL | Status: DC

## 2023-05-09 ENCOUNTER — Other Ambulatory Visit: Payer: Self-pay | Admitting: Cardiology

## 2023-05-09 MED ORDER — ROSUVASTATIN CALCIUM 40 MG PO TABS: 40.0000 mg | ORAL_TABLET | Freq: Every day | ORAL | 0 refills | Status: DC

## 2023-05-16 ENCOUNTER — Other Ambulatory Visit (HOSPITAL_BASED_OUTPATIENT_CLINIC_OR_DEPARTMENT_OTHER): Payer: Self-pay

## 2023-05-17 ENCOUNTER — Other Ambulatory Visit (HOSPITAL_BASED_OUTPATIENT_CLINIC_OR_DEPARTMENT_OTHER): Payer: Self-pay

## 2023-05-17 ENCOUNTER — Telehealth: Payer: Self-pay | Admitting: Cardiology

## 2023-05-17 MED ORDER — ROSUVASTATIN CALCIUM 40 MG PO TABS
40.0000 mg | ORAL_TABLET | Freq: Every day | ORAL | 0 refills | Status: DC
  Filled 2023-05-17: qty 30, 30d supply, fill #0

## 2023-05-17 NOTE — Telephone Encounter (Signed)
Pt's medication was sent to pt's pharmacy as requested. Confirmation received.  °

## 2023-05-17 NOTE — Telephone Encounter (Signed)
*  STAT* If patient is at the pharmacy, call can be transferred to refill team.   1. Which medications need to be refilled? (please list name of each medication and dose if known)   rosuvastatin (CRESTOR) 40 MG tablet     2. Would you like to learn more about the convenience, safety, & potential cost savings by using the [facility]? no   3. Are you open to using the [facility]  [place] [facility] [address]     4. Which pharmacy/location (including street and city if local pharmacy) is medication to be sent to? [place] [facility] [place]     5. Do they need a 30 day or 90 day supply? 90

## 2023-05-22 ENCOUNTER — Other Ambulatory Visit (HOSPITAL_BASED_OUTPATIENT_CLINIC_OR_DEPARTMENT_OTHER): Payer: Self-pay

## 2023-06-11 NOTE — Progress Notes (Unsigned)
  Cardiology Office Note:  .   Date:  06/12/2023  ID:  Lee Ballard, DOB Jul 17, 1966, MRN [id number] PCP: Nelwyn Salisbury, MD  [facility] Providers Cardiologist:  Armanda Magic, MD {  History of Present Illness: .   Lee Ballard is a 57 y.o. male with a past medical history of premature CAD, hypertension, hyperlipidemia and lower extremity edema who was strong family history of CAD with his father dying of an MI at age 90 and several paternal uncles with heart disease at young age here for follow-up appointment.  He was last seen 01/04/2022 by Dr. Mayford Knife and at that time he denied chest pain or pressure, SOB, DOE, PND, orthopnea, lower extremity edema, palpitations or syncope.  Only complaint at that time is that when he works out hard he might get lightheaded but no syncope.  Compliant with medications.  Today, he tells me that his lightheadedness is "hit or miss".  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  Sounds more orthostatic in nature.  Discussed proper hydration and slow transitioning.  Blood pressure 130/85 when he is at home.  Slightly elevated today in the clinic 140/90 on repeat.  Asked him to keep track of his blood pressure at home and if continues to rise please let us know and we can increase his lisinopril.  We have ordered a lipid panel today and we will review his triglycerides.  Reports no shortness of breath nor dyspnea on exertion. Reports no chest pain, pressure, or tightness. No edema, orthopnea, PND. Reports no palpitations.   ROS: pertinent ROS in HPI  Studies Reviewed: Marland Kitchen   EKG Interpretation Date/Time:  Tuesday June 12 2023 08:52:14 EDT Ventricular Rate:  71 PR Interval:  156 QRS Duration:  98 QT Interval:  392 QTC Calculation: 425 R Axis:   11  Text Interpretation: Normal sinus rhythm Normal ECG When compared with ECG of 10-Jun-2021 07:13, No significant change was found Confirmed by Jari Favre [phone])  on 06/12/2023 9:01:41 AM         Physical Exam:   VS:  BP (!) 140/90   Pulse 71   Ht 6\' 1"  (1.854 m)   Wt 288 lb 9.6 oz (130.9 kg)   SpO2 97%   BMI 38.08 kg/m    Wt Readings from Last 3 Encounters:  06/12/23 288 lb 9.6 oz (130.9 kg)  09/15/22 292 lb (132.5 kg)  01/04/22 287 lb (130.2 kg)    GEN: Well nourished, well developed in no acute distress NECK: No JVD; No carotid bruits CARDIAC: RRR, no murmurs, rubs, gallops RESPIRATORY:  Clear to auscultation without rales, wheezing or rhonchi  ABDOMEN: Soft, non-tender, non-distended EXTREMITIES:  No edema; No deformity   ASSESSMENT AND PLAN: .   1.  Coronary artery calcifications/family history of premature CAD -no chest pains or SOB -continue current medications, aspirin 81 mg daily, Vascepa 2 g twice a day, lisinopril 30 mg daily, Crestor 40 mg daily  2.  HTN -140/90 on repeat -continue to track at home -continue current medication regimen -if remains high we can increase lisinopril  3.  HLD -diet has been "okay" -vascepa 2g BID -check lipid panel today -crestor 40mg  daily      Dispo: He can follow-up in a year with Dr. Mayford Knife  Signed, Sharlene Dory, PA-C

## 2023-06-12 ENCOUNTER — Other Ambulatory Visit: Payer: Self-pay | Admitting: *Deleted

## 2023-06-12 ENCOUNTER — Encounter: Payer: Self-pay | Admitting: Physician Assistant

## 2023-06-12 ENCOUNTER — Other Ambulatory Visit (HOSPITAL_BASED_OUTPATIENT_CLINIC_OR_DEPARTMENT_OTHER): Payer: Self-pay

## 2023-06-12 ENCOUNTER — Ambulatory Visit: Payer: Managed Care, Other (non HMO) | Attending: Physician Assistant | Admitting: Physician Assistant

## 2023-06-12 VITALS — BP 140/90 | HR 71 | Ht 73.0 in | Wt 288.6 lb

## 2023-06-12 DIAGNOSIS — E785 Hyperlipidemia, unspecified: Secondary | ICD-10-CM

## 2023-06-12 DIAGNOSIS — I10 Essential (primary) hypertension: Secondary | ICD-10-CM | POA: Diagnosis not present

## 2023-06-12 DIAGNOSIS — I2510 Atherosclerotic heart disease of native coronary artery without angina pectoris: Secondary | ICD-10-CM | POA: Diagnosis not present

## 2023-06-12 MED ORDER — ROSUVASTATIN CALCIUM 40 MG PO TABS
40.0000 mg | ORAL_TABLET | Freq: Every day | ORAL | 11 refills | Status: DC
  Filled 2023-06-12: qty 30, 30d supply, fill #0
  Filled 2023-07-17: qty 30, 30d supply, fill #1
  Filled 2023-08-12: qty 30, 30d supply, fill #2
  Filled 2023-09-12: qty 30, 30d supply, fill #3
  Filled 2023-10-13: qty 30, 30d supply, fill #4
  Filled 2023-11-17: qty 30, 30d supply, fill #5
  Filled 2023-12-17: qty 30, 30d supply, fill #6
  Filled 2024-01-16: qty 30, 30d supply, fill #7
  Filled 2024-02-12: qty 30, 30d supply, fill #8
  Filled 2024-03-12: qty 30, 30d supply, fill #9
  Filled 2024-04-16: qty 30, 30d supply, fill #10
  Filled 2024-05-20: qty 30, 30d supply, fill #11

## 2023-06-12 NOTE — Patient Instructions (Signed)
Medication Instructions:  Your physician recommends that you continue on your current medications as directed. Please refer to the Current Medication list given to you today.  *If you need a refill on your cardiac medications before your next appointment, please call your pharmacy*  Lab Work: LIPIDS, LFT'S-TODAY If you have labs (blood work) drawn today and your tests are completely normal, you will receive your results only by: MyChart Message (if you have MyChart) OR A paper copy in the mail If you have any lab test that is abnormal or we need to change your treatment, we will call you to review the results.  Follow-Up: At [facility], you and your health needs are our priority.  As part of our continuing mission to provide you with exceptional heart care, we have created designated Provider Care Teams.  These Care Teams include your primary Cardiologist (physician) and Advanced Practice Providers (APPs -  Physician Assistants and Nurse Practitioners) who all work together to provide you with the care you need, when you need it.   Your next appointment:   1 year(s)  Provider:   Armanda Magic, MD     Other Instructions Check your blood pressure daily, 1 hr after morning medications for 2 weeks, keep a log and send [place] the readings through mychart at the end of the 2 weeks.   Heart-Healthy Eating Plan Many factors influence your heart health, including eating and exercise habits. Heart health is also called coronary health. Coronary risk increases with abnormal blood fat (lipid) levels. A heart-healthy eating plan includes limiting unhealthy fats, increasing healthy fats, limiting salt (sodium) intake, and making other diet and lifestyle changes. What is my plan? Your health care provider may recommend that: You limit your fat intake to _________% or less of your total calories each day. You limit your saturated fat intake to _________% or less of your total calories each  day. You limit the amount of cholesterol in your diet to less than _________ mg per day. You limit the amount of sodium in your diet to less than _________ mg per day. What are tips for following this plan? Cooking Cook foods using methods other than frying. Baking, boiling, grilling, and broiling are all good options. Other ways to reduce fat include: Removing the skin from poultry. Removing all visible fats from meats. Steaming vegetables in water or broth. Meal planning  At meals, imagine dividing your plate into fourths: Fill one-half of your plate with vegetables and green salads. Fill one-fourth of your plate with whole grains. Fill one-fourth of your plate with lean protein foods. Eat 2-4 cups of vegetables per day. One cup of vegetables equals 1 cup (91 g) broccoli or cauliflower florets, 2 medium carrots, 1 large bell pepper, 1 large sweet potato, 1 large tomato, 1 medium white potato, 2 cups (150 g) raw leafy greens. Eat 1-2 cups of fruit per day. One cup of fruit equals 1 small apple, 1 large banana, 1 cup (237 g) mixed fruit, 1 large orange,  cup (82 g) dried fruit, 1 cup (240 mL) 100% fruit juice. Eat more foods that contain soluble fiber. Examples include apples, broccoli, carrots, beans, peas, and barley. Aim to get 25-30 g of fiber per day. Increase your consumption of legumes, nuts, and seeds to 4-5 servings per week. One serving of dried beans or legumes equals  cup (90 g) cooked, 1 serving of nuts is  oz (12 almonds, 24 pistachios, or 7 walnut halves), and 1 serving of  seeds equals  oz (8 g). Fats Choose healthy fats more often. Choose monounsaturated and polyunsaturated fats, such as olive and canola oils, avocado oil, flaxseeds, walnuts, almonds, and seeds. Eat more omega-3 fats. Choose salmon, mackerel, sardines, tuna, flaxseed oil, and ground flaxseeds. Aim to eat fish at least 2 times each week. Check food labels carefully to identify foods with trans fats or high  amounts of saturated fat. Limit saturated fats. These are found in animal products, such as meats, butter, and cream. Plant sources of saturated fats include palm oil, palm kernel oil, and coconut oil. Avoid foods with partially hydrogenated oils in them. These contain trans fats. Examples are stick margarine, some tub margarines, cookies, crackers, and other baked goods. Avoid fried foods. General information Eat more home-cooked food and less restaurant, buffet, and fast food. Limit or avoid alcohol. Limit foods that are high in added sugar and simple starches such as foods made using white refined flour (white breads, pastries, sweets). Lose weight if you are overweight. Losing just 5-10% of your body weight can help your overall health and prevent diseases such as diabetes and heart disease. Monitor your sodium intake, especially if you have high blood pressure. Talk with your health care provider about your sodium intake. Try to incorporate more vegetarian meals weekly. What foods should I eat? Fruits All fresh, canned (in natural juice), or frozen fruits. Vegetables Fresh or frozen vegetables (raw, steamed, roasted, or grilled). Green salads. Grains Most grains. Choose whole wheat and whole grains most of the time. Rice and pasta, including brown rice and pastas made with whole wheat. Meats and other proteins Lean, well-trimmed beef, veal, pork, and lamb. Chicken and [place] without skin. All fish and shellfish. Wild duck, rabbit, pheasant, and venison. Egg whites or low-cholesterol egg substitutes. Dried beans, peas, lentils, and tofu. Seeds and most nuts. Dairy Low-fat or nonfat cheeses, including ricotta and mozzarella. Skim or 1% milk (liquid, powdered, or evaporated). Buttermilk made with low-fat milk. Nonfat or low-fat yogurt. Fats and oils Non-hydrogenated (trans-free) margarines. Vegetable oils, including soybean, sesame, sunflower, olive, avocado, peanut, safflower, corn, canola,  and cottonseed. Salad dressings or mayonnaise made with a vegetable oil. Beverages Water (mineral or sparkling). Coffee and tea. Unsweetened ice tea. Diet beverages. Sweets and desserts Sherbet, gelatin, and fruit ice. Small amounts of dark chocolate. Limit all sweets and desserts. Seasonings and condiments All seasonings and condiments. The items listed above may not be a complete list of foods and beverages you can eat. Contact a dietitian for more options. What foods should I avoid? Fruits Canned fruit in heavy syrup. Fruit in cream or butter sauce. Fried fruit. Limit coconut. Vegetables Vegetables cooked in cheese, cream, or butter sauce. Fried vegetables. Grains Breads made with saturated or trans fats, oils, or whole milk. Croissants. Sweet rolls. Donuts. High-fat crackers, such as cheese crackers and chips. Meats and other proteins Fatty meats, such as hot dogs, ribs, sausage, bacon, rib-eye roast or steak. High-fat deli meats, such as salami and bologna. Caviar. Domestic duck and goose. Organ meats, such as liver. Dairy Cream, sour cream, cream cheese, and creamed cottage cheese. Whole-milk cheeses. Whole or 2% milk (liquid, evaporated, or condensed). Whole buttermilk. Cream sauce or high-fat cheese sauce. Whole-milk yogurt. Fats and oils Meat fat, or shortening. Cocoa butter, hydrogenated oils, palm oil, coconut oil, palm kernel oil. Solid fats and shortenings, including bacon fat, salt pork, lard, and butter. Nondairy cream substitutes. Salad dressings with cheese or sour cream. Beverages Regular sodas and any  drinks with added sugar. Sweets and desserts Frosting. Pudding. Cookies. Cakes. Pies. Milk chocolate or white chocolate. Buttered syrups. Full-fat ice cream or ice cream drinks. The items listed above may not be a complete list of foods and beverages to avoid. Contact a dietitian for more information. Summary Heart-healthy meal planning includes limiting unhealthy fats,  increasing healthy fats, limiting salt (sodium) intake and making other diet and lifestyle changes. Lose weight if you are overweight. Losing just 5-10% of your body weight can help your overall health and prevent diseases such as diabetes and heart disease. Focus on eating a balance of foods, including fruits and vegetables, low-fat or nonfat dairy, lean protein, nuts and legumes, whole grains, and heart-healthy oils and fats. This information is not intended to replace advice given to you by your health care provider. Make sure you discuss any questions you have with your health care provider. Document Revised: 09/26/2021 Document Reviewed: 09/26/2021 Elsevier Patient Education  2024 Elsevier Inc. Low-Sodium Eating Plan Salt (sodium) helps you keep a healthy balance of fluids in your body. Too much sodium can raise your blood pressure. It can also cause fluid and waste to be held in your body. Your health care provider or dietitian may recommend a low-sodium eating plan if you have high blood pressure (hypertension), kidney disease, liver disease, or heart failure. Eating less sodium can help lower your blood pressure and reduce swelling. It can also protect your heart, liver, and kidneys. What are tips for following this plan? Reading food labels  Check food labels for the amount of sodium per serving. If you eat more than one serving, you must multiply the listed amount by the number of servings. Choose foods with less than 140 milligrams (mg) of sodium per serving. Avoid foods with 300 mg of sodium or more per serving. Always check how much sodium is in a product, even if the label says "unsalted" or "no salt added." Shopping  Buy products labeled as "low-sodium" or "no salt added." Buy fresh foods. Avoid canned foods and pre-made or frozen meals. Avoid canned, cured, or processed meats. Buy breads that have less than 80 mg of sodium per slice. Cooking  Eat more home-cooked food. Try  to eat less restaurant, buffet, and fast food. Try not to add salt when you cook. Use salt-free seasonings or herbs instead of table salt or sea salt. Check with your provider or pharmacist before using salt substitutes. Cook with plant-based oils, such as canola, sunflower, or olive oil. Meal planning When eating at a restaurant, ask if your food can be made with less salt or no salt. Avoid dishes labeled as brined, pickled, cured, or smoked. Avoid dishes made with soy sauce, miso, or teriyaki sauce. Avoid foods that have monosodium glutamate (MSG) in them. MSG may be added to some restaurant food, sauces, soups, bouillon, and canned foods. Make meals that can be grilled, baked, poached, roasted, or steamed. These are often made with less sodium. General information Try to limit your sodium intake to 1,500-2,300 mg each day, or the amount told by your provider. What foods should I eat? Fruits Fresh, frozen, or canned fruit. Fruit juice. Vegetables Fresh or frozen vegetables. "No salt added" canned vegetables. "No salt added" tomato sauce and paste. Low-sodium or reduced-sodium tomato and vegetable juice. Grains Low-sodium cereals, such as oats, puffed wheat and rice, and shredded wheat. Low-sodium crackers. Unsalted rice. Unsalted pasta. Low-sodium bread. Whole grain breads and whole grain pasta. Meats and other proteins Fresh or  frozen meat, poultry, seafood, and fish. These should have no added salt. Low-sodium canned tuna and salmon. Unsalted nuts. Dried peas, beans, and lentils without added salt. Unsalted canned beans. Eggs. Unsalted nut butters. Dairy Milk. Soy milk. Cheese that is naturally low in sodium, such as ricotta cheese, fresh mozzarella, or Swiss cheese. Low-sodium or reduced-sodium cheese. Cream cheese. Yogurt. Seasonings and condiments Fresh and dried herbs and spices. Salt-free seasonings. Low-sodium mustard and ketchup. Sodium-free salad dressing. Sodium-free light  mayonnaise. Fresh or refrigerated horseradish. Lemon juice. Vinegar. Other foods Homemade, reduced-sodium, or low-sodium soups. Unsalted popcorn and pretzels. Low-salt or salt-free chips. The items listed above may not be all the foods and drinks you can have. Talk to a dietitian to learn more. What foods should I avoid? Vegetables Sauerkraut, pickled vegetables, and relishes. Olives. [place] fries. Onion rings. Regular canned vegetables, except low-sodium or reduced-sodium items. Regular canned tomato sauce and paste. Regular tomato and vegetable juice. Frozen vegetables in sauces. Grains Instant hot cereals. Bread stuffing, pancake, and biscuit mixes. Croutons. Seasoned rice or pasta mixes. Noodle soup cups. Boxed or frozen macaroni and cheese. Regular salted crackers. Self-rising flour. Meats and other proteins Meat or fish that is salted, canned, smoked, spiced, or pickled. Precooked or cured meat, such as sausages or meat loaves. Tomasa Blase. Ham. Pepperoni. Hot dogs. Corned beef. Chipped beef. Salt pork. Jerky. Pickled herring, anchovies, and sardines. Regular canned tuna. Salted nuts. Dairy Processed cheese and cheese spreads. Hard cheeses. Cheese curds. Blue cheese. Feta cheese. String cheese. Regular cottage cheese. Buttermilk. Canned milk. Fats and oils Salted butter. Regular margarine. Ghee. Bacon fat. Seasonings and condiments Onion salt, garlic salt, seasoned salt, table salt, and sea salt. Canned and packaged gravies. Worcestershire sauce. Tartar sauce. Barbecue sauce. Teriyaki sauce. Soy sauce, including reduced-sodium soy sauce. Steak sauce. Fish sauce. Oyster sauce. Cocktail sauce. Horseradish that you find on the shelf. Regular ketchup and mustard. Meat flavorings and tenderizers. Bouillon cubes. Hot sauce. Pre-made or packaged marinades. Pre-made or packaged taco seasonings. Relishes. Regular salad dressings. Salsa. Other foods Salted popcorn and pretzels. Corn chips and puffs. Potato  and tortilla chips. Canned or dried soups. Pizza. Frozen entrees and pot pies. The items listed above may not be all the foods and drinks you should avoid. Talk to a dietitian to learn more. This information is not intended to replace advice given to you by your health care provider. Make sure you discuss any questions you have with your health care provider. Document Revised: 09/07/2022 Document Reviewed: 09/07/2022 Elsevier Patient Education  2024 [employer].

## 2023-06-13 LAB — LIPID PANEL
Chol/HDL Ratio: 3.4 {ratio} (ref 0.0–5.0)
Cholesterol, Total: 108 mg/dL (ref 100–199)
HDL: 32 mg/dL — ABNORMAL LOW (ref >39)
LDL Chol Calc (NIH): 48 mg/dL (ref 0–99)
Triglycerides: 164 mg/dL — ABNORMAL HIGH (ref 0–149)
VLDL Cholesterol Cal: 28 mg/dL (ref 5–40)

## 2023-06-13 LAB — HEPATIC FUNCTION PANEL
ALT: 35 [IU]/L (ref 0–44)
AST: 34 [IU]/L (ref 0–40)
Albumin: 4.3 g/dL (ref 3.8–4.9)
Alkaline Phosphatase: 150 [IU]/L — ABNORMAL HIGH (ref 44–121)
Bilirubin Total: 0.6 mg/dL (ref 0.0–1.2)
Bilirubin, Direct: 0.18 mg/dL (ref 0.00–0.40)
Total Protein: 7.4 g/dL (ref 6.0–8.5)

## 2023-06-21 ENCOUNTER — Other Ambulatory Visit: Payer: Self-pay | Admitting: Cardiology

## 2023-06-21 ENCOUNTER — Other Ambulatory Visit (HOSPITAL_BASED_OUTPATIENT_CLINIC_OR_DEPARTMENT_OTHER): Payer: Self-pay

## 2023-06-21 MED FILL — Icosapent Ethyl Cap 1 GM: ORAL | 30 days supply | Qty: 120 | Fill #0 | Status: CN

## 2023-06-23 ENCOUNTER — Other Ambulatory Visit (HOSPITAL_BASED_OUTPATIENT_CLINIC_OR_DEPARTMENT_OTHER): Payer: Self-pay

## 2023-06-25 ENCOUNTER — Other Ambulatory Visit: Payer: Self-pay

## 2023-06-25 ENCOUNTER — Other Ambulatory Visit (HOSPITAL_BASED_OUTPATIENT_CLINIC_OR_DEPARTMENT_OTHER): Payer: Self-pay

## 2023-06-25 MED FILL — Icosapent Ethyl Cap 1 GM: ORAL | 30 days supply | Qty: 120 | Fill #0 | Status: AC

## 2023-07-17 ENCOUNTER — Other Ambulatory Visit (HOSPITAL_BASED_OUTPATIENT_CLINIC_OR_DEPARTMENT_OTHER): Payer: Self-pay

## 2023-07-21 MED FILL — Icosapent Ethyl Cap 1 GM: ORAL | 30 days supply | Qty: 120 | Fill #1 | Status: CN

## 2023-07-27 ENCOUNTER — Other Ambulatory Visit (HOSPITAL_BASED_OUTPATIENT_CLINIC_OR_DEPARTMENT_OTHER): Payer: Self-pay

## 2023-07-27 MED FILL — Icosapent Ethyl Cap 1 GM: ORAL | 30 days supply | Qty: 120 | Fill #1 | Status: AC

## 2023-08-21 ENCOUNTER — Other Ambulatory Visit (HOSPITAL_BASED_OUTPATIENT_CLINIC_OR_DEPARTMENT_OTHER): Payer: Self-pay

## 2023-08-21 MED FILL — Icosapent Ethyl Cap 1 GM: ORAL | 30 days supply | Qty: 120 | Fill #2 | Status: AC

## 2023-09-18 ENCOUNTER — Encounter: Payer: Self-pay | Admitting: Family Medicine

## 2023-09-18 ENCOUNTER — Ambulatory Visit (INDEPENDENT_AMBULATORY_CARE_PROVIDER_SITE_OTHER): Payer: Managed Care, Other (non HMO) | Admitting: Family Medicine

## 2023-09-18 ENCOUNTER — Other Ambulatory Visit (HOSPITAL_BASED_OUTPATIENT_CLINIC_OR_DEPARTMENT_OTHER): Payer: Self-pay

## 2023-09-18 VITALS — BP 118/78 | HR 78 | Temp 98.1°F | Wt 289.0 lb

## 2023-09-18 DIAGNOSIS — I10 Essential (primary) hypertension: Secondary | ICD-10-CM | POA: Diagnosis not present

## 2023-09-18 DIAGNOSIS — K219 Gastro-esophageal reflux disease without esophagitis: Secondary | ICD-10-CM | POA: Diagnosis not present

## 2023-09-18 DIAGNOSIS — E7800 Pure hypercholesterolemia, unspecified: Secondary | ICD-10-CM

## 2023-09-18 MED ORDER — OMEPRAZOLE 40 MG PO CPDR
40.0000 mg | DELAYED_RELEASE_CAPSULE | Freq: Every day | ORAL | 11 refills | Status: DC
  Filled 2023-09-18: qty 30, 30d supply, fill #0
  Filled 2023-10-20: qty 30, 30d supply, fill #1
  Filled 2023-11-17: qty 30, 30d supply, fill #2
  Filled 2023-12-17: qty 30, 30d supply, fill #3
  Filled 2024-01-16: qty 30, 30d supply, fill #4
  Filled 2024-02-12: qty 30, 30d supply, fill #5
  Filled 2024-03-12: qty 30, 30d supply, fill #6
  Filled 2024-04-16 (×2): qty 30, 30d supply, fill #7
  Filled 2024-05-20: qty 30, 30d supply, fill #8
  Filled 2024-06-17: qty 30, 30d supply, fill #9
  Filled 2024-07-17: qty 30, 30d supply, fill #10
  Filled 2024-08-19: qty 30, 30d supply, fill #11

## 2023-09-18 MED ORDER — LISINOPRIL 30 MG PO TABS
30.0000 mg | ORAL_TABLET | Freq: Every day | ORAL | 11 refills | Status: DC
  Filled 2023-09-18: qty 30, 30d supply, fill #0
  Filled 2023-10-20: qty 30, 30d supply, fill #1
  Filled 2023-11-17: qty 30, 30d supply, fill #2
  Filled 2023-12-17: qty 30, 30d supply, fill #3
  Filled 2024-01-16: qty 30, 30d supply, fill #4
  Filled 2024-02-12: qty 30, 30d supply, fill #5
  Filled 2024-03-12: qty 30, 30d supply, fill #6
  Filled 2024-04-16 (×2): qty 30, 30d supply, fill #7
  Filled 2024-05-20: qty 30, 30d supply, fill #8
  Filled 2024-06-17: qty 30, 30d supply, fill #9

## 2023-09-18 NOTE — Progress Notes (Signed)
   Subjective:    Patient ID: Lee Ballard, male    DOB: Jun 04, 1966, 58 y.o.   MRN: [id number]  HPI Here for refills. He is doing well. He saw Cardiology in November, and he had his lipids checked. His BP has been stable.    Review of Systems  Constitutional: Negative.   Respiratory: Negative.    Cardiovascular: Negative.        Objective:   Physical Exam Constitutional:      Appearance: Normal appearance.  Cardiovascular:     Rate and Rhythm: Normal rate and regular rhythm.     Pulses: Normal pulses.     Heart sounds: Normal heart sounds.  Pulmonary:     Effort: Pulmonary effort is normal.     Breath sounds: Normal breath sounds.  Neurological:     Mental Status: He is alert.           Assessment & Plan:  His HTN and GERD are stable. We refilled the Lisinopril  and Omeprazole . He will return in a few weeks for a well exam and labs.  Garnette Olmsted, MD

## 2023-10-13 ENCOUNTER — Other Ambulatory Visit (HOSPITAL_BASED_OUTPATIENT_CLINIC_OR_DEPARTMENT_OTHER): Payer: Self-pay

## 2023-10-13 ENCOUNTER — Other Ambulatory Visit: Payer: Self-pay

## 2023-10-13 MED FILL — Icosapent Ethyl Cap 1 GM: ORAL | 90 days supply | Qty: 360 | Fill #3 | Status: AC

## 2023-10-15 ENCOUNTER — Other Ambulatory Visit: Payer: Self-pay

## 2023-10-15 ENCOUNTER — Other Ambulatory Visit (HOSPITAL_BASED_OUTPATIENT_CLINIC_OR_DEPARTMENT_OTHER): Payer: Self-pay

## 2023-10-21 ENCOUNTER — Other Ambulatory Visit (HOSPITAL_BASED_OUTPATIENT_CLINIC_OR_DEPARTMENT_OTHER): Payer: Self-pay

## 2023-11-06 ENCOUNTER — Telehealth (INDEPENDENT_AMBULATORY_CARE_PROVIDER_SITE_OTHER): Admitting: Family Medicine

## 2023-11-06 ENCOUNTER — Other Ambulatory Visit (HOSPITAL_BASED_OUTPATIENT_CLINIC_OR_DEPARTMENT_OTHER): Payer: Self-pay

## 2023-11-06 ENCOUNTER — Encounter: Payer: Self-pay | Admitting: Family Medicine

## 2023-11-06 VITALS — Wt 289.0 lb

## 2023-11-06 DIAGNOSIS — J40 Bronchitis, not specified as acute or chronic: Secondary | ICD-10-CM | POA: Diagnosis not present

## 2023-11-06 MED ORDER — AZITHROMYCIN 250 MG PO TABS
ORAL_TABLET | ORAL | 0 refills | Status: AC
  Filled 2023-11-06: qty 6, 5d supply, fill #0

## 2023-11-06 NOTE — Progress Notes (Signed)
 Subjective:    Patient ID: Lee Ballard, male    DOB: Feb 17, 1966, 58 y.o.   MRN: [id number]  HPI Virtual Visit via Video Note  I connected with the patient on 11/06/23 at  8:30 AM EST by a video enabled telemedicine application and verified that I am speaking with the correct person using two identifiers.  Location patient: home Location provider:work or home office Persons participating in the virtual visit: patient, provider  I discussed the limitations of evaluation and management by telemedicine and the availability of in person appointments. The patient expressed understanding and agreed to proceed.   HPI: Here for 5 days of chest congestion and coughing up green mucus. No fever or SOB. No body aches. Taking Mucinex DM.   ROS: See pertinent positives and negatives per HPI.  Past Medical History:  Diagnosis Date   Agatston coronary artery calcium score less than 100    coronary Ca score 37 in 2018   Edema leg    Erectile dysfunction    Family history of premature CAD 09/28/2016   GERD (gastroesophageal reflux disease)    Hyperlipidemia    Hypertension    Varicose vein of leg     Past Surgical History:  Procedure Laterality Date   COLONOSCOPY  04/29/2015   per Dr. Marina Goodell, hyperplastic polyps, repeat in 10 yrs    ENDOVENOUS ABLATION SAPHENOUS VEIN W/ LASER Right    SHOULDER ARTHROSCOPY WITH ROTATOR CUFF REPAIR AND SUBACROMIAL DECOMPRESSION Right 06/13/2021   Procedure: RIGHT SHOULDER ARTHROSCOPY WITH ROTATOR CUFF REPAIR;  Surgeon: Huel Cote, MD;  Location: [facility];  Service: Orthopedics;  Laterality: Right;   TONSILLECTOMY      Family History  Problem Relation Age of Onset   Cancer Mother    Heart attack Father 49   Colon cancer Neg Hx      Current Outpatient Medications:    aspirin EC 81 MG tablet, Take 1 tablet (81 mg total) by mouth daily., Disp: 90 tablet, Rfl: 3   Calcium 200 MG TABS, Take 400 mg by mouth daily., Disp: , Rfl:     ibuprofen (ADVIL,MOTRIN) 200 MG tablet, Take 200 mg by mouth every 6 (six) hours as needed., Disp: , Rfl:    icosapent Ethyl (VASCEPA) 1 g capsule, Take 2 capsules (2 g total) by mouth 2 (two) times daily., Disp: 360 capsule, Rfl: 3   lisinopril (ZESTRIL) 30 MG tablet, Take 1 tablet (30 mg total) by mouth daily., Disp: 30 tablet, Rfl: 11   Multiple Vitamins-Minerals (MULTIVITAMIN,TX-MINERALS) tablet, Take 1 tablet by mouth daily.  , Disp: , Rfl:    omeprazole (PRILOSEC) 40 MG capsule, Take 1 capsule (40 mg total) by mouth daily., Disp: 30 capsule, Rfl: 11   POTASSIUM PO, Take 200 mg by mouth daily., Disp: , Rfl:    rosuvastatin (CRESTOR) 40 MG tablet, Take 1 tablet (40 mg total) by mouth daily., Disp: 30 tablet, Rfl: 11  EXAM:  VITALS per patient if applicable:  GENERAL: alert, oriented, appears well and in no acute distress  HEENT: atraumatic, conjunttiva clear, no obvious abnormalities on inspection of external nose and ears  NECK: normal movements of the head and neck  LUNGS: on inspection no signs of respiratory distress, breathing rate appears normal, no obvious gross SOB, gasping or wheezing  CV: no obvious cyanosis  MS: moves all visible extremities without noticeable abnormality  PSYCH/NEURO: pleasant and cooperative, no obvious depression or anxiety, speech and thought processing grossly intact  ASSESSMENT AND PLAN:  Bronchitis, treat with a Zpack. Gershon Crane, MD  Discussed the following assessment and plan:  No diagnosis found.     I discussed the assessment and treatment plan with the patient. The patient was provided an opportunity to ask questions and all were answered. The patient agreed with the plan and demonstrated an understanding of the instructions.   The patient was advised to call back or seek an in-person evaluation if the symptoms worsen or if the condition fails to improve as anticipated.      Review of Systems     Objective:   Physical  Exam        Assessment & Plan:

## 2023-11-19 ENCOUNTER — Other Ambulatory Visit (HOSPITAL_BASED_OUTPATIENT_CLINIC_OR_DEPARTMENT_OTHER): Payer: Self-pay

## 2023-12-17 ENCOUNTER — Other Ambulatory Visit (HOSPITAL_BASED_OUTPATIENT_CLINIC_OR_DEPARTMENT_OTHER): Payer: Self-pay

## 2023-12-18 ENCOUNTER — Other Ambulatory Visit: Payer: Self-pay

## 2024-01-16 ENCOUNTER — Other Ambulatory Visit: Payer: Self-pay

## 2024-01-16 MED FILL — Icosapent Ethyl Cap 1 GM: ORAL | 90 days supply | Qty: 360 | Fill #4 | Status: AC

## 2024-02-12 ENCOUNTER — Other Ambulatory Visit (HOSPITAL_BASED_OUTPATIENT_CLINIC_OR_DEPARTMENT_OTHER): Payer: Self-pay

## 2024-03-12 ENCOUNTER — Other Ambulatory Visit (HOSPITAL_BASED_OUTPATIENT_CLINIC_OR_DEPARTMENT_OTHER): Payer: Self-pay

## 2024-03-25 ENCOUNTER — Other Ambulatory Visit (HOSPITAL_COMMUNITY): Payer: Self-pay

## 2024-03-25 ENCOUNTER — Telehealth: Payer: Self-pay | Admitting: Pharmacy Technician

## 2024-03-25 NOTE — Telephone Encounter (Signed)
 Pharmacy Patient Advocate Encounter   Received notification from CoverMyMeds that prior authorization for icosapent  1gm is required/requested.   Insurance verification completed.   The patient is insured through [employer] .   Per test claim: PA required; PA submitted to above mentioned insurance via latent Key/confirmation #/EOC [id number] Status is pending

## 2024-03-26 NOTE — Telephone Encounter (Signed)
 Pharmacy Patient Advocate Encounter  Received notification from [employer] that Prior Authorization for icosapent  1gm has been APPROVED from 03/25/24 to 03/25/25   PA #/Case ID/Reference #: [id number]

## 2024-04-16 ENCOUNTER — Other Ambulatory Visit (HOSPITAL_BASED_OUTPATIENT_CLINIC_OR_DEPARTMENT_OTHER): Payer: Self-pay

## 2024-05-20 ENCOUNTER — Other Ambulatory Visit (HOSPITAL_BASED_OUTPATIENT_CLINIC_OR_DEPARTMENT_OTHER): Payer: Self-pay

## 2024-05-20 ENCOUNTER — Other Ambulatory Visit: Payer: Self-pay

## 2024-05-20 MED FILL — Icosapent Ethyl Cap 1 GM: ORAL | 90 days supply | Qty: 360 | Fill #5 | Status: AC

## 2024-06-17 ENCOUNTER — Other Ambulatory Visit: Payer: Self-pay

## 2024-06-17 ENCOUNTER — Other Ambulatory Visit: Payer: Self-pay | Admitting: Physician Assistant

## 2024-06-17 ENCOUNTER — Other Ambulatory Visit (HOSPITAL_BASED_OUTPATIENT_CLINIC_OR_DEPARTMENT_OTHER): Payer: Self-pay

## 2024-06-17 MED ORDER — ROSUVASTATIN CALCIUM 40 MG PO TABS
40.0000 mg | ORAL_TABLET | Freq: Every day | ORAL | 11 refills | Status: AC
  Filled 2024-06-17: qty 30, 30d supply, fill #0
  Filled 2024-07-17: qty 30, 30d supply, fill #1
  Filled 2024-08-19: qty 30, 30d supply, fill #2
  Filled 2024-09-17: qty 30, 30d supply, fill #3

## 2024-06-24 ENCOUNTER — Encounter (HOSPITAL_BASED_OUTPATIENT_CLINIC_OR_DEPARTMENT_OTHER): Payer: Self-pay

## 2024-06-25 NOTE — Progress Notes (Unsigned)
 Cardiology Office Note:  .   Date:  06/26/2024  ID:  Lynwood [place]ig[place], DOB 1966/08/31, MRN [id number] PCP: Johnny Garnette LABOR, MD  [facility] Providers Cardiologist:  Wilbert Bihari, MD  APP: Orren Fabry, PA-C  History of Present Illness: .   Lee Ballard is a 58 y.o. male with a past medical history of premature CAD, hypertension, hyperlipidemia and lower extremity edema who was strong family history of CAD with his father dying of an MI at age 31 and several paternal uncles with heart disease at young age here for follow-up appointment.  He was last seen 01/04/2022 by Dr. Bihari and at that time he denied chest pain or pressure, SOB, DOE, PND, orthopnea, lower extremity edema, palpitations or syncope.  Only complaint at that time is that when he works out hard he might get lightheaded but no syncope.  Compliant with medications.  I saw him back in Oct of last year, he tells me that his lightheadedness is hit or miss.  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  Sounds more orthostatic in nature.  Discussed proper hydration and slow transitioning.  Blood pressure 130/85 when he is at home.  Slightly elevated today in the clinic 140/90 on repeat.  Asked him to keep track of his blood pressure at home and if continues to rise please let us  know and we can increase his lisinopril .  We have ordered a lipid panel today and we will review his triglycerides.  Reports no shortness of breath nor dyspnea on exertion. Reports no chest pain, pressure, or tightness. No edema, orthopnea, PND. Reports no palpitations.   Today, he presents with hx of hypertension for cardiovascular follow-up.  His home blood pressure readings are around 165/83 mmHg. He takes lisinopril  30 mg daily for hypertension, Crestor  40 mg for cholesterol, a baby aspirin , and Vascepa  for triglycerides, with no reported side effects.  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 He wears compression stockings for venous insufficiency.  His last lipid panel showed an LDL of 48 mg/dL, HDL of 32 mg/dL, and triglycerides of 835 mg/dL. His last hemoglobin A1c was 7.0  in January of the previous year.  Reports no shortness of breath nor dyspnea on exertion. Reports no chest pain, pressure, or tightness. No edema, orthopnea, PND. Reports no palpitations.   Discussed the use of AI scribe software for clinical note transcription with the patient, who gave verbal consent to proceed.  ROS: pertinent ROS in HPI  Studies Reviewed: SABRA   EKG Interpretation Date/Time:  Thursday June 26 2024 08:03:43 EDT Ventricular Rate:  67 PR Interval:  152 QRS Duration:  96 QT Interval:  376 QTC Calculation: 397 R Axis:   -5  Text Interpretation: Normal sinus rhythm Minimal voltage criteria for LVH, may be normal variant ( R in aVL ) When compared with ECG of 12-Jun-2023 08:52, No significant change was found Confirmed by Fabry Orren [phone]) on 06/26/2024 8:12:29 AM    Normal echo back in 2018     Physical Exam:   VS:  BP (!) 142/88   Pulse 70   Ht 6' 1 (1.854 m)   Wt 280 lb 6.4 oz (127.2 kg)   SpO2 94%   BMI 36.99 kg/m    Wt Readings from Last 3 Encounters:  06/26/24 280 lb 6.4 oz (127.2 kg)  11/06/23 289  lb (131.1 kg)  09/18/23 289 lb (131.1 kg)    GEN: Well nourished, well developed in no acute distress NECK: No JVD; No carotid bruits CARDIAC: RRR, no murmurs, rubs, gallops RESPIRATORY:  Clear to auscultation without rales, wheezing or rhonchi  ABDOMEN: Soft, non-tender, non-distended EXTREMITIES:  No edema; No deformity   ASSESSMENT AND PLAN: .    Essential hypertension Blood pressure elevated at 165/83 mmHg, improved to 142/88 mmHg with lisinopril  30 mg daily. Diastolic pressure remains slightly elevated. - Increase lisinopril  to 40 mg daily. - Consider ARB if uncontrolled - Monitor blood pressure  regularly.  Atherosclerotic heart disease of native coronary artery without angina EKG normal sinus rhythm, no angina symptoms. - Continue aspirin  therapy.  Hyperlipidemia LDL 48 mg/dL, HDL 32 mg/dL, triglycerides 835 mg/dL. Triglycerides slightly above target. On Crestor  and Vascepa  without side effects. - Order lipid panel. - Order liver function tests.  Dispo: He can follow-up in a year with Dr. Shlomo or myself   Signed, Orren LOISE Fabry, PA-C

## 2024-06-26 ENCOUNTER — Other Ambulatory Visit (HOSPITAL_BASED_OUTPATIENT_CLINIC_OR_DEPARTMENT_OTHER): Payer: Self-pay

## 2024-06-26 ENCOUNTER — Ambulatory Visit: Attending: Internal Medicine | Admitting: Physician Assistant

## 2024-06-26 ENCOUNTER — Encounter: Payer: Self-pay | Admitting: Physician Assistant

## 2024-06-26 VITALS — BP 142/88 | HR 70 | Ht 73.0 in | Wt 280.4 lb

## 2024-06-26 DIAGNOSIS — E785 Hyperlipidemia, unspecified: Secondary | ICD-10-CM

## 2024-06-26 DIAGNOSIS — I2510 Atherosclerotic heart disease of native coronary artery without angina pectoris: Secondary | ICD-10-CM

## 2024-06-26 DIAGNOSIS — I10 Essential (primary) hypertension: Secondary | ICD-10-CM

## 2024-06-26 MED ORDER — LISINOPRIL 40 MG PO TABS
40.0000 mg | ORAL_TABLET | Freq: Every day | ORAL | 2 refills | Status: AC
  Filled 2024-06-26: qty 90, 90d supply, fill #0
  Filled 2024-09-23: qty 90, 90d supply, fill #1

## 2024-06-26 NOTE — Patient Instructions (Addendum)
 Medication Instructions:  INCREASE Lisinopril  to 40mg  Take 1 tablet once a day  *If you need a refill on your cardiac medications before your next appointment, please call your pharmacy*  Lab Work: TODAY-LIPIDS & CMET If you have labs (blood work) drawn today and your tests are completely normal, you will receive your results only by: MyChart Message (if you have MyChart) OR A paper copy in the mail If you have any lab test that is abnormal or we need to change your treatment, we will call you to review the results.  Testing/Procedures: NONE ORDERED  Follow-Up: At [facility], you and your health needs are our priority.  As part of our continuing mission to provide you with exceptional heart care, our providers are all part of one team.  This team includes your primary Cardiologist (physician) and Advanced Practice Providers or APPs (Physician Assistants and Nurse Practitioners) who all work together to provide you with the care you need, when you need it.  Your next appointment:   12 month(s)  Provider:   Wilbert Bihari, MD or Orren Fabry, PA   We recommend signing up for the patient portal called MyChart.  Sign up information is provided on this After Visit Summary.  MyChart is used to connect with patients for Virtual Visits (Telemedicine).  Patients are able to view lab/test results, encounter notes, upcoming appointments, etc.  Non-urgent messages can be sent to your provider as well.   To learn more about what you can do with MyChart, go to [email or web address].   Other Instructions Continue to track your blood pressure.

## 2024-06-27 LAB — COMPREHENSIVE METABOLIC PANEL WITH GFR
ALT: 32 IU/L (ref 0–44)
AST: 30 IU/L (ref 0–40)
Albumin: 4.3 g/dL (ref 3.8–4.9)
Alkaline Phosphatase: 150 IU/L — ABNORMAL HIGH (ref 47–123)
BUN/Creatinine Ratio: 17 (ref 10–24)
BUN: 18 mg/dL (ref 8–27)
Bilirubin Total: 0.6 mg/dL (ref 0.0–1.2)
CO2: 22 mmol/L (ref 20–29)
Calcium: 9.5 mg/dL (ref 8.6–10.2)
Chloride: 102 mmol/L (ref 96–106)
Creatinine, Ser: 1.06 mg/dL (ref 0.76–1.27)
Globulin, Total: 2.7 g/dL (ref 1.5–4.5)
Glucose: 124 mg/dL — ABNORMAL HIGH (ref 70–99)
Potassium: 4.6 mmol/L (ref 3.5–5.2)
Sodium: 139 mmol/L (ref 134–144)
Total Protein: 7 g/dL (ref 6.0–8.5)
eGFR: 80 mL/min/1.73 (ref >59)

## 2024-06-27 LAB — LIPID PANEL
Chol/HDL Ratio: 4 ratio (ref 0.0–5.0)
Cholesterol, Total: 121 mg/dL (ref 100–199)
HDL: 30 mg/dL — ABNORMAL LOW (ref >39)
LDL Chol Calc (NIH): 51 mg/dL (ref 0–99)
Triglycerides: 251 mg/dL — ABNORMAL HIGH (ref 0–149)
VLDL Cholesterol Cal: 40 mg/dL (ref 5–40)

## 2024-07-01 ENCOUNTER — Ambulatory Visit: Payer: Self-pay | Admitting: Physician Assistant

## 2024-07-04 ENCOUNTER — Other Ambulatory Visit: Payer: Self-pay | Admitting: Cardiology

## 2024-07-07 ENCOUNTER — Other Ambulatory Visit (HOSPITAL_BASED_OUTPATIENT_CLINIC_OR_DEPARTMENT_OTHER): Payer: Self-pay

## 2024-07-07 MED ORDER — ICOSAPENT ETHYL 1 G PO CAPS
2.0000 g | ORAL_CAPSULE | Freq: Two times a day (BID) | ORAL | 3 refills | Status: AC
  Filled 2024-07-07 – 2024-08-19 (×2): qty 360, 90d supply, fill #0

## 2024-07-07 NOTE — Telephone Encounter (Addendum)
 LVM asking pt to call our office.

## 2024-07-18 ENCOUNTER — Other Ambulatory Visit (HOSPITAL_BASED_OUTPATIENT_CLINIC_OR_DEPARTMENT_OTHER): Payer: Self-pay

## 2024-08-19 ENCOUNTER — Other Ambulatory Visit (HOSPITAL_BASED_OUTPATIENT_CLINIC_OR_DEPARTMENT_OTHER): Payer: Self-pay

## 2024-09-17 ENCOUNTER — Other Ambulatory Visit: Payer: Self-pay | Admitting: Family Medicine

## 2024-09-18 ENCOUNTER — Other Ambulatory Visit (HOSPITAL_BASED_OUTPATIENT_CLINIC_OR_DEPARTMENT_OTHER): Payer: Self-pay

## 2024-09-18 ENCOUNTER — Other Ambulatory Visit: Payer: Self-pay

## 2024-09-18 MED ORDER — OMEPRAZOLE 40 MG PO CPDR
40.0000 mg | DELAYED_RELEASE_CAPSULE | Freq: Every day | ORAL | 11 refills | Status: AC
  Filled 2024-09-18: qty 30, 30d supply, fill #0
